# Patient Record
Sex: Female | Born: 1950
Health system: Southern US, Community
[De-identification: ages and names within clinical notes are randomized; demographics above are authoritative.]

## PROBLEM LIST (undated history)

## (undated) DIAGNOSIS — M8580 Other specified disorders of bone density and structure, unspecified site: Secondary | ICD-10-CM

## (undated) DIAGNOSIS — C801 Malignant (primary) neoplasm, unspecified: Secondary | ICD-10-CM

## (undated) DIAGNOSIS — K625 Hemorrhage of anus and rectum: Secondary | ICD-10-CM

## (undated) DIAGNOSIS — Z803 Family history of malignant neoplasm of breast: Secondary | ICD-10-CM

## (undated) DIAGNOSIS — I10 Essential (primary) hypertension: Secondary | ICD-10-CM

## (undated) DIAGNOSIS — M810 Age-related osteoporosis without current pathological fracture: Secondary | ICD-10-CM

## (undated) HISTORY — DX: Age-related osteoporosis without current pathological fracture: M81.0

## (undated) HISTORY — PX: KNEE ARTHROSCOPY: SHX127

## (undated) HISTORY — DX: Family history of malignant neoplasm of breast: Z80.3

## (undated) HISTORY — DX: Essential (primary) hypertension: I10

## (undated) HISTORY — DX: Other specified disorders of bone density and structure, unspecified site: M85.80

## (undated) HISTORY — DX: Malignant (primary) neoplasm, unspecified: C80.1

## (undated) HISTORY — DX: Hemorrhage of anus and rectum: K62.5

---

## 1992-05-16 HISTORY — PX: TUBAL LIGATION: SHX77

## 1996-05-16 HISTORY — PX: BREAST SURGERY: SHX581

## 1999-07-12 ENCOUNTER — Other Ambulatory Visit: Admission: RE | Admit: 1999-07-12 | Discharge: 1999-07-12 | Payer: Self-pay | Admitting: Obstetrics & Gynecology

## 2000-01-31 ENCOUNTER — Encounter (INDEPENDENT_AMBULATORY_CARE_PROVIDER_SITE_OTHER): Payer: Self-pay | Admitting: Specialist

## 2000-01-31 ENCOUNTER — Other Ambulatory Visit: Admission: RE | Admit: 2000-01-31 | Discharge: 2000-01-31 | Payer: Self-pay | Admitting: Obstetrics & Gynecology

## 2001-11-20 ENCOUNTER — Other Ambulatory Visit: Admission: RE | Admit: 2001-11-20 | Discharge: 2001-11-20 | Payer: Self-pay | Admitting: Obstetrics and Gynecology

## 2002-11-22 ENCOUNTER — Other Ambulatory Visit: Admission: RE | Admit: 2002-11-22 | Discharge: 2002-11-22 | Payer: Self-pay | Admitting: Obstetrics and Gynecology

## 2003-09-02 ENCOUNTER — Encounter: Payer: Self-pay | Admitting: Internal Medicine

## 2003-09-02 LAB — HM COLONOSCOPY

## 2005-04-22 ENCOUNTER — Other Ambulatory Visit: Admission: RE | Admit: 2005-04-22 | Discharge: 2005-04-22 | Payer: Self-pay | Admitting: Obstetrics and Gynecology

## 2006-05-16 HISTORY — PX: OTHER SURGICAL HISTORY: SHX169

## 2006-09-11 ENCOUNTER — Other Ambulatory Visit: Admission: RE | Admit: 2006-09-11 | Discharge: 2006-09-11 | Payer: Self-pay | Admitting: Obstetrics and Gynecology

## 2007-05-05 ENCOUNTER — Inpatient Hospital Stay (HOSPITAL_COMMUNITY): Admission: EM | Admit: 2007-05-05 | Discharge: 2007-05-07 | Payer: Self-pay | Admitting: Emergency Medicine

## 2007-09-14 ENCOUNTER — Other Ambulatory Visit: Admission: RE | Admit: 2007-09-14 | Discharge: 2007-09-14 | Payer: Self-pay | Admitting: Obstetrics and Gynecology

## 2008-09-19 ENCOUNTER — Ambulatory Visit: Payer: Self-pay | Admitting: Internal Medicine

## 2008-09-19 DIAGNOSIS — I10 Essential (primary) hypertension: Secondary | ICD-10-CM | POA: Insufficient documentation

## 2008-09-19 DIAGNOSIS — K625 Hemorrhage of anus and rectum: Secondary | ICD-10-CM | POA: Insufficient documentation

## 2009-08-18 ENCOUNTER — Ambulatory Visit: Payer: Self-pay

## 2009-09-02 ENCOUNTER — Ambulatory Visit: Payer: Self-pay

## 2009-12-01 LAB — HM PAP SMEAR: HM Pap smear: NORMAL

## 2010-09-28 NOTE — Op Note (Signed)
NAME:  Diane Day, Diane Day NO.:  [id number]   MEDICAL RECORD NO.:  [id number]          PATIENT TYPE:  INP   LOCATION:  1604                         FACILITY:  [facility]   PHYSICIAN:  Lenard Galloway. Mortenson, M.D.DATE OF BIRTH:  May 21, 1950   DATE OF PROCEDURE:  05/05/2007  DATE OF DISCHARGE:                               OPERATIVE REPORT   PREOPERATIVE DIAGNOSIS:  Very complex, displaced three parts epicondylar  fracture, right elbow, with fracture medial lateral condyles displaced  and midline transverse fracture trochlea displaced and rotated.   POSTOPERATIVE DIAGNOSIS:  Very complex, displaced three parts  epicondylar fracture, right elbow, with fracture medial lateral condyles  displaced and midline transverse fracture trochlea displaced and  rotated.   OPERATION:  Osteotomy olecranon using fixation olecranon plate;  transtrochlear  interference screws. right elbow; open reduction  internal fixation T epicondylar fractures using congruent medial and  lateral humeral plates; anterior transfer of ulnar nerve: the entire  operative was extremely complex and required an extended operative time.   ANESTHESIA:  General.   SURGEON:  Gaylyn Rong.   PROCEDURE:  The patient placed on the operating table in a supine  position.  The right extremity was prepped with DuraPrep and draped out  in the usual manner.  A tourniquet was placed on the upper arm.  The  patient was put in supine position.  Arm was then wrapped out with  Esmarch.  Tourniquet was elevated.  The arm was then put up on the  chest.  Incision was made starting distal to the olecranon about 4  inches and carried proximal to the olecranon and up the humeral shaft  about 6 inches.  Skin edges were retracted.  Fascia was bluntly  dissected away with the fingers.  The olecranon fixation plate was  placed over the tip of the olecranon, and drill hole was placed through  the hole for the fixation screw in the  tip the olecranon.  Osteotomy  site was done in a chevron manner between the fixation holes in the  olecranon plate.  The triceps was then swept proximally and soft tissues  bluntly dissected.  The ulnar nerve was identified and isolated.  Ulnar  nerve was totally released and throughout the entire operative procedure  kept in the visual field and had soft rubber drains around the nerve.  The triceps was then stripped off the posterior aspect of the humerus  using blunt dissection and finger dissection only.  Fascia was split as  noted above with the finger tips.  Nerve stimulator was used throughout.  Wound was then irrigated.  A number of free fragments of loose bone was  then removed.  Radial nerve could be seen and stimulated with a nerve  centimeter, and this functioned nicely.  Attention was first turned to  the epicondyles and the trochlea.  Trochlea was then reduced, and  anatomic position held in place with clamps.  Fixation pins were then  applied, and excellent fixation of the trochlea was achieved.  This was  accomplished to my satisfaction.  The medial and lateral T-split  was  reduced and held in  place with a large clamp.  A great deal of time was  spent getting this in anatomic position.  Once this was accomplished,  Kirchner wires were placed in crisscross pattern to stabilize the  fracture.  The trochlear and epicondyles were then placed over the  distal end of the T fracture of the humerus, and this held in anatomic  position and further K-wires were used. Essentially an anatomic  reconstruction was achieved after a great deal of reparative sites  maneuvering. I was  __________  very pleased with the construct once  this was accomplished.  Two guide wires were placed across the trochlea  from lateral to medial, and this was overdrilled with a drill.  Two  partially threaded 2.7 screws were then passed from lateral to medial  and this locked the trochlea in excellent  position.  It was very stable.  At this point, the long humeral plates were selected.  The lateral plate  was put in position first and the whole construct held in place with  lateral plate and clamps.  Anatomic position was achieved.  Drill holes  were made in the lateral plate, and both cortices were rasped and  drilled, measured an appropriate length, cortical screws were inserted.  This gave an extremely tight anatomic construct that was very stable.  Several screws were placed in the medial cortex distally through the  plate.  During this procedure, the radial nerve was isolated and  projected and visualized throughout the entire procedure and never at  risk.  It was frequently checked with a nerve stimulator, and this  functioned very nicely at the end of the case.  The long medial plate  over the medial condyle was then put in position and had to be contoured  with contour wrenches and finally fit very nicely.  This held in place  with clamps.  Drill holes were made at the appropriate level.  These  were measured, and appropriate length screws were then inserted.  Several screws were placed far above the fracture site, both in the  medial and lateral side.  The mrdial plate was then tightened down, and  an excellent construct was achieved.  The elbow could be put through an  excellent range of motion.  There was no instability with full range of  motion, smooth gliding of the joint and articular surfaces.  The  tourniquet was dropped after several hours, and bleeders were  coagulated.  Nice hemostasis was achieved.  At this point, it was felt  appropriate to reattach olecranon in a standard manner.  The olecranon  plate was put in place.  The screw hole which been placed previously had  a long screw placed through the olecranon plate and through the tip of  the olecranon into the proximal ulna.  The distal screw holes in the  plate were drilled, and the appropriate length screws  were inserted.  An  anatomic reconstruction of the olecranon osteotomy was achieved very  nicely.  At this point, the C-arm was brought in position, and multiple  radiographs were taken.  I was extremely pleased with the entire  construct.  Again, this took an extended period of time and was  extremely complicated and difficult reconstruction but ultimately was  extremely stable and essentially in anatomic position.  Again, the wound  was irrigated with copious amounts of saline solution.  Part of the  fascia was closed.  The ulnar nerve had been transferred anteriorly to  prevent  impingement on the medial plate.  Two-0 Vicryl was used to close  the subcutaneous tissue.  Stainless steel staples were used to close the  skin.  Sterile dressings were applied, and the patient returned to  recovery room in excellent condition.  Technically, this went extremely  well. The operative time was in excess of 4 hours.      Rodney A. Chaney Malling, M.D.  Electronically Signed     RAM/MEDQ  D:  05/06/2007  T:  05/07/2007  Job:  [id number]

## 2010-11-30 ENCOUNTER — Encounter: Payer: Self-pay | Admitting: Internal Medicine

## 2010-12-02 ENCOUNTER — Encounter: Payer: Self-pay | Admitting: Internal Medicine

## 2010-12-02 ENCOUNTER — Ambulatory Visit (INDEPENDENT_AMBULATORY_CARE_PROVIDER_SITE_OTHER): Payer: BC Managed Care – PPO | Admitting: Internal Medicine

## 2010-12-02 ENCOUNTER — Other Ambulatory Visit (INDEPENDENT_AMBULATORY_CARE_PROVIDER_SITE_OTHER): Payer: BC Managed Care – PPO

## 2010-12-02 VITALS — BP 118/78 | HR 79 | Temp 98.4°F | Resp 16 | Wt 141.0 lb

## 2010-12-02 DIAGNOSIS — I10 Essential (primary) hypertension: Secondary | ICD-10-CM

## 2010-12-02 LAB — BASIC METABOLIC PANEL
BUN: 16 mg/dL (ref 6–23)
Calcium: 9.3 mg/dL (ref 8.4–10.5)
Chloride: 106 mEq/L (ref 96–112)
GFR: 81.94 mL/min (ref >60.00)
Glucose, Bld: 105 mg/dL — ABNORMAL HIGH (ref 70–99)

## 2010-12-02 MED ORDER — BENAZEPRIL HCL 10 MG PO TABS: 10.0000 mg | ORAL_TABLET | Freq: Every day | ORAL | Status: DC

## 2010-12-02 NOTE — Progress Notes (Signed)
  Subjective:    Patient ID: Diane Day, female    DOB: 02/05/1951, 60 y.o.   MRN: [id number]  Hypertension This is a chronic problem. The current episode started more than 1 year ago. The problem has been gradually improving since onset. The problem is controlled. Pertinent negatives include no anxiety, blurred vision, chest pain, headaches, neck pain, orthopnea, palpitations, peripheral edema, PND, shortness of breath or sweats. There are no associated agents to hypertension. Past treatments include ACE inhibitors. The current treatment provides significant improvement. There are no compliance problems.       Review of Systems  Constitutional: Negative.   HENT: Negative.  Negative for neck pain.   Eyes: Negative.  Negative for blurred vision.  Respiratory: Negative.  Negative for shortness of breath.   Cardiovascular: Negative.  Negative for chest pain, palpitations, orthopnea and PND.  Gastrointestinal: Negative.   Genitourinary: Negative.   Musculoskeletal: Negative.   Skin: Negative.   Neurological: Negative.  Negative for headaches.  Hematological: Negative.   Psychiatric/Behavioral: Negative.        Objective:   Physical Exam  Vitals reviewed. Constitutional: She is oriented to person, place, and time. She appears well-developed and well-nourished. No distress.  HENT:  Head: Normocephalic and atraumatic.  Right Ear: External ear normal.  Left Ear: External ear normal.  Nose: Nose normal.  Mouth/Throat: Oropharynx is clear and moist. No oropharyngeal exudate.  Eyes: Conjunctivae and EOM are normal. Pupils are equal, round, and reactive to light. Right eye exhibits no discharge. Left eye exhibits no discharge. No scleral icterus.  Neck: Normal range of motion. Neck supple. No JVD present. No tracheal deviation present. No thyromegaly present.  Cardiovascular: Normal rate, regular rhythm, normal heart sounds and intact distal pulses.  Exam reveals no gallop and no  friction rub.   No murmur heard. Pulmonary/Chest: Effort normal and breath sounds normal. No stridor. No respiratory distress. She has no wheezes. She has no rales. She exhibits no tenderness.  Abdominal: Soft. Bowel sounds are normal. She exhibits no distension. There is no tenderness. There is no rebound and no guarding.  Musculoskeletal: Normal range of motion. She exhibits no edema.  Lymphadenopathy:    She has no cervical adenopathy.  Neurological: She is alert and oriented to person, place, and time. She has normal reflexes. She displays normal reflexes. No cranial nerve deficit. She exhibits normal muscle tone. Coordination normal.  Skin: Skin is warm and dry. No rash noted. She is not diaphoretic. No erythema. No pallor.  Psychiatric: She has a normal mood and affect. Her behavior is normal. Judgment and thought content normal.          Assessment & Plan:

## 2010-12-02 NOTE — Assessment & Plan Note (Signed)
She is doing well on the ACEI, I will check her lytes and renal function

## 2010-12-02 NOTE — Patient Instructions (Signed)

## 2011-02-18 LAB — CBC
HCT: 36.8
Hemoglobin: 12.7
Hemoglobin: 7.2 — CL
MCHC: 35.7
MCV: 88.7
Platelets: 64 — ABNORMAL LOW
RBC: 2.26 — ABNORMAL LOW
WBC: 6.6

## 2011-02-18 LAB — BASIC METABOLIC PANEL
BUN: 12
Chloride: 111
Creatinine, Ser: 0.76
GFR calc non Af Amer: >60
Potassium: 3.5
Sodium: 144

## 2011-02-18 LAB — DIFFERENTIAL
Basophils Relative: 0
Lymphs Abs: 0.7
Monocytes Absolute: 0.2
Monocytes Relative: 3
Neutrophils Relative %: 87 — ABNORMAL HIGH

## 2011-06-13 ENCOUNTER — Encounter (HOSPITAL_COMMUNITY): Payer: Self-pay | Admitting: Pharmacy Technician

## 2011-09-28 ENCOUNTER — Other Ambulatory Visit: Payer: Self-pay | Admitting: Obstetrics and Gynecology

## 2011-09-28 DIAGNOSIS — Z803 Family history of malignant neoplasm of breast: Secondary | ICD-10-CM

## 2011-10-13 ENCOUNTER — Ambulatory Visit
Admission: RE | Admit: 2011-10-13 | Discharge: 2011-10-13 | Disposition: A | Discharge status: Home / Self Care | Payer: BC Managed Care – PPO | Source: Ambulatory Visit | Attending: Obstetrics and Gynecology | Admitting: Obstetrics and Gynecology

## 2011-10-13 DIAGNOSIS — Z803 Family history of malignant neoplasm of breast: Secondary | ICD-10-CM

## 2011-10-13 MED ORDER — GADOBENATE DIMEGLUMINE 529 MG/ML IV SOLN: 12.0000 mL | Freq: Once | INTRAVENOUS | Status: AC | PRN

## 2011-11-02 ENCOUNTER — Telehealth: Payer: Self-pay | Admitting: Internal Medicine

## 2011-11-02 NOTE — Telephone Encounter (Signed)
Caller: Katyra/Patient; PCP: Sanda Linger; CB#: [phone]; Call regarding Back Pain; Mid back spasms  (under her shoulder blades) onset 1000 this AM. No known trauma. Has had episodes of similar back pain in the past. Ibuprofen 800mg  mid afternoon w/o relief. Appt sched for 11/03/11 @ 1015 with Dr. Yetta Barre per Back Sx Protocol.

## 2011-11-03 ENCOUNTER — Encounter: Payer: Self-pay | Admitting: Internal Medicine

## 2011-11-03 ENCOUNTER — Ambulatory Visit (INDEPENDENT_AMBULATORY_CARE_PROVIDER_SITE_OTHER): Payer: BC Managed Care – PPO | Admitting: Internal Medicine

## 2011-11-03 VITALS — BP 134/82 | HR 69 | Temp 97.8°F | Resp 16 | Wt 132.2 lb

## 2011-11-03 DIAGNOSIS — I10 Essential (primary) hypertension: Secondary | ICD-10-CM

## 2011-11-03 DIAGNOSIS — M546 Pain in thoracic spine: Secondary | ICD-10-CM | POA: Insufficient documentation

## 2011-11-03 MED ORDER — OXYCODONE-ACETAMINOPHEN 7.5-325 MG PO TABS: 1.0000 | ORAL_TABLET | ORAL | Status: AC | PRN

## 2011-11-03 NOTE — Assessment & Plan Note (Signed)
She has MS T-spine pain with no mitigating factors, she has tried nsaids but needs something stronger for pain, she will try percocet and she will continue with yoga, pt ed material was given to her as well

## 2011-11-03 NOTE — Progress Notes (Signed)
Subjective:    Patient ID: Diane Day, female    DOB: 29-Sep-1950, 61 y.o.   MRN: [id number]  Back Pain This is a recurrent problem. The current episode started more than 1 year ago (first episode was 2 years ago, recurred yesterday). The problem occurs intermittently. The problem is unchanged. The pain is present in the thoracic spine. The quality of the pain is described as aching. The pain does not radiate. The pain is at a severity of 4/10. The pain is moderate. The pain is worse during the day. The symptoms are aggravated by bending. Stiffness is present all day. Pertinent negatives include no abdominal pain, bladder incontinence, bowel incontinence, chest pain, dysuria, fever, headaches, leg pain, numbness, paresis, paresthesias, pelvic pain, perianal numbness, tingling, weakness or weight loss. She has tried NSAIDs for the symptoms. The treatment provided mild relief.      Review of Systems  Constitutional: Negative.  Negative for fever and weight loss.  HENT: Negative.   Eyes: Negative.   Respiratory: Negative.  Negative for cough, chest tightness, shortness of breath, wheezing and stridor.   Cardiovascular: Negative.  Negative for chest pain.  Gastrointestinal: Negative.  Negative for abdominal pain and bowel incontinence.  Genitourinary: Negative.  Negative for bladder incontinence, dysuria and pelvic pain.  Musculoskeletal: Positive for back pain. Negative for myalgias, joint swelling, arthralgias and gait problem.  Skin: Negative.   Neurological: Negative for dizziness, tingling, tremors, seizures, facial asymmetry, speech difficulty, weakness, light-headedness, numbness, headaches and paresthesias.  Hematological: Negative for adenopathy. Does not bruise/bleed easily.  Psychiatric/Behavioral: Negative.        Objective:   Physical Exam  Vitals reviewed. Constitutional: She is oriented to person, place, and time. She appears well-developed and well-nourished. No  distress.  HENT:  Head: Normocephalic and atraumatic.  Mouth/Throat: Oropharynx is clear and moist. No oropharyngeal exudate.  Eyes: Conjunctivae are normal. Right eye exhibits no discharge. Left eye exhibits no discharge. No scleral icterus.  Neck: Normal range of motion. Neck supple. No JVD present. No tracheal deviation present. No thyromegaly present.  Cardiovascular: Normal rate, regular rhythm, normal heart sounds and intact distal pulses.  Exam reveals no gallop and no friction rub.   No murmur heard. Pulmonary/Chest: Effort normal and breath sounds normal. No stridor. No respiratory distress. She has no wheezes. She has no rales. She exhibits no tenderness.  Abdominal: Soft. Bowel sounds are normal. She exhibits no distension and no mass. There is no tenderness. There is no rebound and no guarding.  Musculoskeletal: Normal range of motion. She exhibits no edema and no tenderness.       Thoracic back: Normal. She exhibits normal range of motion, no tenderness, no bony tenderness, no swelling, no edema, no deformity, no laceration, no pain, no spasm and normal pulse.  Lymphadenopathy:    She has no cervical adenopathy.  Neurological: She is alert and oriented to person, place, and time. She has normal strength. She displays no atrophy, no tremor and normal reflexes. No cranial nerve deficit or sensory deficit. She exhibits normal muscle tone. She displays a negative Romberg sign. She displays no seizure activity. Coordination and gait normal.  Reflex Scores:      Tricep reflexes are 1+ on the right side and 1+ on the left side.      Bicep reflexes are 1+ on the right side.      Brachioradialis reflexes are 1+ on the right side.      Patellar reflexes are 1+ on the right  side and 1+ on the left side.      Achilles reflexes are 1+ on the right side and 1+ on the left side. Skin: Skin is warm and dry. No rash noted. She is not diaphoretic. No erythema. No pallor.  Psychiatric: She has a  normal mood and affect. Her behavior is normal. Judgment and thought content normal.     Lab Results  Component Value Date   WBC 14.3* 05/05/2007   HGB 12.7 RESULT REPEATED AND VERIFIED DELTA CHECK NOTED 05/05/2007   HCT 36.8 05/05/2007   PLT 231 RESULT REPEATED AND VERIFIED DELTA CHECK NOTED 05/05/2007   GLUCOSE 105* 12/02/2010   NA 142 12/02/2010   K 5.1 12/02/2010   CL 106 12/02/2010   CREATININE 0.8 12/02/2010   BUN 16 12/02/2010   CO2 29 12/02/2010      Assessment & Plan:

## 2011-11-03 NOTE — Patient Instructions (Signed)
Back Pain, Adult Low back pain is very common. About 1 in 5 people have back pain.The cause of low back pain is rarely dangerous. The pain often gets better over time.About half of people with a sudden onset of back pain feel better in just 2 weeks. About 8 in 10 people feel better by 6 weeks.  CAUSES Some common causes of back pain include:  Strain of the muscles or ligaments supporting the spine.   Wear and tear (degeneration) of the spinal discs.   Arthritis.   Direct injury to the back.  DIAGNOSIS Most of the time, the direct cause of low back pain is not known.However, back pain can be treated effectively even when the exact cause of the pain is unknown.Answering your caregiver's questions about your overall health and symptoms is one of the most accurate ways to make sure the cause of your pain is not dangerous. If your caregiver needs more information, he or she may order lab work or imaging tests (X-rays or MRIs).However, even if imaging tests show changes in your back, this usually does not require surgery. HOME CARE INSTRUCTIONS For many people, back pain returns.Since low back pain is rarely dangerous, it is often a condition that people can learn to manageon their own.   Remain active. It is stressful on the back to sit or stand in one place. Do not sit, drive, or stand in one place for more than 30 minutes at a time. Take short walks on level surfaces as soon as pain allows.Try to increase the length of time you walk each day.   Do not stay in bed.Resting more than 1 or 2 days can delay your recovery.   Do not avoid exercise or work.Your body is made to move.It is not dangerous to be active, even though your back may hurt.Your back will likely heal faster if you return to being active before your pain is gone.   Pay attention to your body when you bend and lift. Many people have less discomfortwhen lifting if they bend their knees, keep the load close to their  bodies,and avoid twisting. Often, the most comfortable positions are those that put less stress on your recovering back.   Find a comfortable position to sleep. Use a firm mattress and lie on your side with your knees slightly bent. If you lie on your back, put a pillow under your knees.   Only take over-the-counter or prescription medicines as directed by your caregiver. Over-the-counter medicines to reduce pain and inflammation are often the most helpful.Your caregiver may prescribe muscle relaxant drugs.These medicines help dull your pain so you can more quickly return to your normal activities and healthy exercise.   Put ice on the injured area.   Put ice in a plastic bag.   Place a towel between your skin and the bag.   Leave the ice on for 15 to 20 minutes, 3 to 4 times a day for the first 2 to 3 days. After that, ice and heat may be alternated to reduce pain and spasms.   Ask your caregiver about trying back exercises and gentle massage. This may be of some benefit.   Avoid feeling anxious or stressed.Stress increases muscle tension and can worsen back pain.It is important to recognize when you are anxious or stressed and learn ways to manage it.Exercise is a great option.  SEEK MEDICAL CARE IF:  You have pain that is not relieved with rest or medicine.   You have   pain that does not improve in 1 week.   You have new symptoms.   You are generally not feeling well.  SEEK IMMEDIATE MEDICAL CARE IF:   You have pain that radiates from your back into your legs.   You develop new bowel or bladder control problems.   You have unusual weakness or numbness in your arms or legs.   You develop nausea or vomiting.   You develop abdominal pain.   You feel faint.  Document Released: 05/02/2005 Document Revised: 04/21/2011 Document Reviewed: 09/20/2010 [employer] Patient Information 2012 [employer]. 

## 2011-11-03 NOTE — Assessment & Plan Note (Signed)
Her BP is well controlled 

## 2011-12-02 ENCOUNTER — Ambulatory Visit (INDEPENDENT_AMBULATORY_CARE_PROVIDER_SITE_OTHER): Payer: BC Managed Care – PPO | Admitting: Internal Medicine

## 2011-12-02 ENCOUNTER — Telehealth: Payer: Self-pay

## 2011-12-02 ENCOUNTER — Other Ambulatory Visit (INDEPENDENT_AMBULATORY_CARE_PROVIDER_SITE_OTHER): Payer: BC Managed Care – PPO

## 2011-12-02 ENCOUNTER — Encounter: Payer: Self-pay | Admitting: Internal Medicine

## 2011-12-02 VITALS — BP 156/96 | HR 69 | Temp 98.4°F | Resp 16 | Wt 133.0 lb

## 2011-12-02 DIAGNOSIS — E7800 Pure hypercholesterolemia, unspecified: Secondary | ICD-10-CM

## 2011-12-02 DIAGNOSIS — E875 Hyperkalemia: Secondary | ICD-10-CM

## 2011-12-02 DIAGNOSIS — M546 Pain in thoracic spine: Secondary | ICD-10-CM

## 2011-12-02 DIAGNOSIS — I10 Essential (primary) hypertension: Secondary | ICD-10-CM

## 2011-12-02 DIAGNOSIS — R7309 Other abnormal glucose: Secondary | ICD-10-CM

## 2011-12-02 DIAGNOSIS — R739 Hyperglycemia, unspecified: Secondary | ICD-10-CM | POA: Insufficient documentation

## 2011-12-02 LAB — COMPREHENSIVE METABOLIC PANEL
ALT: 17 U/L (ref 0–35)
AST: 19 U/L (ref 0–37)
Albumin: 4.5 g/dL (ref 3.5–5.2)
BUN: 15 mg/dL (ref 6–23)
Calcium: 10 mg/dL (ref 8.4–10.5)
Chloride: 109 mEq/L (ref 96–112)
Potassium: 6.7 mEq/L (ref 3.5–5.1)
Sodium: 144 mEq/L (ref 135–145)
Total Protein: 6.9 g/dL (ref 6.0–8.3)

## 2011-12-02 LAB — CBC WITH DIFFERENTIAL/PLATELET
Basophils Absolute: 0.1 10*3/uL (ref 0.0–0.1)
Eosinophils Absolute: 0 10*3/uL (ref 0.0–0.7)
Lymphocytes Relative: 34.1 % (ref 12.0–46.0)
MCHC: 33.2 g/dL (ref 30.0–36.0)
Monocytes Absolute: 0.4 10*3/uL (ref 0.1–1.0)
Neutrophils Relative %: 56.7 % (ref 43.0–77.0)
Platelets: 211 10*3/uL (ref 150.0–400.0)
RBC: 4.7 Mil/uL (ref 3.87–5.11)
RDW: 13.5 % (ref 11.5–14.6)

## 2011-12-02 LAB — URINALYSIS, ROUTINE W REFLEX MICROSCOPIC
Bilirubin Urine: NEGATIVE
Leukocytes, UA: NEGATIVE
Nitrite: NEGATIVE
Specific Gravity, Urine: <=1.005 (ref 1.000–1.030)
pH: 6.5 (ref 5.0–8.0)

## 2011-12-02 LAB — TSH: TSH: 0.56 u[IU]/mL (ref 0.35–5.50)

## 2011-12-02 LAB — HEMOGLOBIN A1C: Hgb A1c MFr Bld: 5.7 % (ref 4.6–6.5)

## 2011-12-02 LAB — LIPID PANEL
Cholesterol: 200 mg/dL (ref 0–200)
LDL Cholesterol: 101 mg/dL — ABNORMAL HIGH (ref 0–99)

## 2011-12-02 LAB — POTASSIUM: Potassium: 3.9 mEq/L (ref 3.5–5.1)

## 2011-12-02 MED ORDER — OLMESARTAN MEDOXOMIL-HCTZ 40-12.5 MG PO TABS: 1.0000 | ORAL_TABLET | Freq: Every day | ORAL | Status: DC

## 2011-12-02 NOTE — Assessment & Plan Note (Signed)
Labs today showed a high K+, she will return for recheck and is it is elevated on repeat test then I believe this is due to the ACEI so the change to Benicar should help this

## 2011-12-02 NOTE — Telephone Encounter (Signed)
Hope from [place] lab called to report a critical potassium of 6.7. MD notified and advised for pt to come back and have rechecked. Patient notified and will stop back by for recheck

## 2011-12-02 NOTE — Assessment & Plan Note (Signed)
Her BP is not well controlled so I have asked her to stop the ACEI and to start Benicar-HCT, I will check her labs today to look for secondary causes and end organ damage.

## 2011-12-02 NOTE — Patient Instructions (Signed)

## 2011-12-02 NOTE — Progress Notes (Signed)
  Subjective:    Patient ID: Diane Day, female    DOB: 1950/12/22, 61 y.o.   MRN: [id number]  Hypertension This is a chronic problem. The current episode started more than 1 year ago. The problem has been gradually worsening since onset. The problem is uncontrolled. Pertinent negatives include no anxiety, blurred vision, chest pain, headaches, malaise/fatigue, neck pain, orthopnea, palpitations, peripheral edema, PND, shortness of breath or sweats. Past treatments include ACE inhibitors. The current treatment provides mild improvement. There are no compliance problems.       Review of Systems  Constitutional: Negative for fever, chills, malaise/fatigue, diaphoresis, activity change, appetite change, fatigue and unexpected weight change.  HENT: Negative for neck pain.   Eyes: Negative.  Negative for blurred vision.  Respiratory: Negative for cough, chest tightness, shortness of breath, wheezing and stridor.   Cardiovascular: Negative for chest pain, palpitations, orthopnea, leg swelling and PND.  Gastrointestinal: Negative for vomiting, abdominal pain, diarrhea, constipation, blood in stool and anal bleeding.  Genitourinary: Negative.   Musculoskeletal: Negative.   Skin: Negative for color change, pallor, rash and wound.  Neurological: Negative.  Negative for headaches.  Hematological: Negative for adenopathy. Does not bruise/bleed easily.  Psychiatric/Behavioral: Negative.        Objective:   Physical Exam  Vitals reviewed. Constitutional: She is oriented to person, place, and time. She appears well-developed and well-nourished. No distress.  HENT:  Head: Normocephalic and atraumatic.  Mouth/Throat: Oropharynx is clear and moist. No oropharyngeal exudate.  Eyes: Conjunctivae are normal. Right eye exhibits no discharge. Left eye exhibits no discharge. No scleral icterus.  Neck: Normal range of motion. Neck supple. No JVD present. No tracheal deviation present. No thyromegaly  present.  Cardiovascular: Normal rate, regular rhythm, normal heart sounds and intact distal pulses.  Exam reveals no gallop and no friction rub.   No murmur heard. Pulmonary/Chest: Effort normal and breath sounds normal. No stridor. No respiratory distress. She has no wheezes. She has no rales. She exhibits no tenderness.  Abdominal: Soft. Bowel sounds are normal. She exhibits no distension and no mass. There is no tenderness. There is no rebound and no guarding.  Musculoskeletal: Normal range of motion. She exhibits no edema and no tenderness.  Lymphadenopathy:    She has no cervical adenopathy.  Neurological: She is oriented to person, place, and time.  Skin: Skin is warm and dry. No rash noted. She is not diaphoretic. No erythema. No pallor.  Psychiatric: She has a normal mood and affect. Her behavior is normal. Judgment and thought content normal.     Lab Results  Component Value Date   WBC 14.3* 05/05/2007   HGB 12.7 RESULT REPEATED AND VERIFIED DELTA CHECK NOTED 05/05/2007   HCT 36.8 05/05/2007   PLT 231 RESULT REPEATED AND VERIFIED DELTA CHECK NOTED 05/05/2007   GLUCOSE 105* 12/02/2010   NA 142 12/02/2010   K 5.1 12/02/2010   CL 106 12/02/2010   CREATININE 0.8 12/02/2010   BUN 16 12/02/2010   CO2 29 12/02/2010       Assessment & Plan:

## 2011-12-02 NOTE — Assessment & Plan Note (Signed)
This has resolved.

## 2011-12-02 NOTE — Assessment & Plan Note (Signed)
I will check her a1c today to see if she has developed DM II 

## 2011-12-02 NOTE — Assessment & Plan Note (Signed)
FLP TSH and CMP today 

## 2012-01-13 ENCOUNTER — Ambulatory Visit (INDEPENDENT_AMBULATORY_CARE_PROVIDER_SITE_OTHER): Payer: BC Managed Care – PPO

## 2012-01-13 ENCOUNTER — Other Ambulatory Visit: Payer: Self-pay

## 2012-01-13 VITALS — BP 118/76

## 2012-01-13 DIAGNOSIS — I10 Essential (primary) hypertension: Secondary | ICD-10-CM

## 2012-01-13 MED ORDER — OLMESARTAN MEDOXOMIL-HCTZ 40-12.5 MG PO TABS: 1.0000 | ORAL_TABLET | Freq: Every day | ORAL | Status: DC

## 2012-07-23 ENCOUNTER — Telehealth: Payer: Self-pay | Admitting: Internal Medicine

## 2012-07-23 NOTE — Telephone Encounter (Signed)
Spoke with patient and she states she is having intermittent rectal bleeding. She is using Preparation H without relief. Scheduled with Doug Sou, PA on 07/25/12 at 8:30 AM.

## 2012-07-25 ENCOUNTER — Ambulatory Visit (INDEPENDENT_AMBULATORY_CARE_PROVIDER_SITE_OTHER): Payer: BC Managed Care – PPO | Admitting: Gastroenterology

## 2012-07-25 ENCOUNTER — Encounter: Payer: Self-pay | Admitting: Gastroenterology

## 2012-07-25 ENCOUNTER — Encounter: Payer: Self-pay | Admitting: Internal Medicine

## 2012-07-25 ENCOUNTER — Other Ambulatory Visit: Payer: Self-pay | Admitting: *Deleted

## 2012-07-25 VITALS — BP 118/68 | HR 66 | Ht 67.0 in | Wt 131.0 lb

## 2012-07-25 DIAGNOSIS — K922 Gastrointestinal hemorrhage, unspecified: Secondary | ICD-10-CM | POA: Insufficient documentation

## 2012-07-25 MED ORDER — HYDROCORTISONE ACE-PRAMOXINE 2.5-1 % RE CREA: TOPICAL_CREAM | Freq: Two times a day (BID) | RECTAL | Status: DC

## 2012-07-25 MED ORDER — MOVIPREP 100 G PO SOLR: 1.0000 | Freq: Once | ORAL | Status: AC

## 2012-07-25 NOTE — Progress Notes (Signed)
07/25/2012 Diane Day [id number] 08-07-1950   HISTORY OF PRESENT ILLNESS:  Patient is a pleasant 62 year old female who is a patient of Dr. Regino Schultze.  She was seen in 2005 for complaints of rectal bleeding and underwent colonoscopy, which showed only diverticulosis.  She returned in 2010 with complaints of the same type of rectal bleeding and was treated with analpram cream for outlet bleeding.  Says that the cream helps temporarily while she was using it, however, the bleeding eventually returns.  She returns once again today with the same complaint.  Has intermittent small volume rectal bleeding on the TP.  Worsens with walking.  BM's regular just a little softer more recently and with some urgency at times.  No abdominal pain.  Appetite is good.  Denies weight loss, but I do notice that her weight is down 13 pounds since 5 years ago.     Past Medical History  Diagnosis Date  . Rectal bleeding   . Hypertension    Past Surgical History  Procedure Laterality Date  . Repair broken arm  2008  . Tubal ligation  1994    reports that she quit smoking about 37 years ago. She does not have any smokeless tobacco history on file. She reports that she drinks about 4.2 ounces of alcohol per week. She reports that she does not use illicit drugs. family history includes Cancer in her father, mother, and sister; Diabetes in her father; Heart disease in her father; and Hypertension in her father. No Known Allergies    Outpatient Encounter Prescriptions as of 07/25/2012  Medication Sig Dispense Refill  . calcium carbonate (TUMS - DOSED IN MG ELEMENTAL CALCIUM) 500 MG chewable tablet Chew 1 tablet by mouth as needed.       . Cholecalciferol (VITAMIN D3) 2000 UNITS TABS Take by mouth every morning.        . Multiple Vitamin (MULTIVITAMIN) tablet Take 1 tablet by mouth daily.        Marland Kitchen olmesartan-hydrochlorothiazide (BENICAR HCT) 40-12.5 MG per tablet Take 1 tablet by mouth daily.  90 tablet  1   . Probiotic Product (PROBIOTIC PO) Take by mouth.         No facility-administered encounter medications on file as of 07/25/2012.     REVIEW OF SYSTEMS  : All other systems reviewed and negative except where noted in the History of Present Illness.   PHYSICAL EXAM: BP 118/68  Pulse 66  Ht 5\' 7"  (1.702 m)  Wt 131 lb (59.421 kg)  BMI 20.51 kg/m2  LMP 05/16/2006 General: Well developed white female in no acute distress Head: Normocephalic and atraumatic Eyes:  sclerae anicteric,conjunctive pink. Ears: Normal auditory acuity Lungs: Clear throughout to auscultation Heart: Regular rate and rhythm Abdomen: Soft, nontender, non distended. No masses or hepatomegaly noted. Normal bowel sounds. Rectal: Deferred.  Will be performed at the time of colonoscopy. Musculoskeletal: Symmetrical with no gross deformities  Skin: No lesions on visible extremities Extremities: No edema  Neurological: Alert oriented x 4, grossly nonfocal Psychological:  Alert and cooperative. Normal mood and affect  ASSESSMENT AND PLAN: -LGIB:  Intermittent small volume rectal bleeding C/W outlet bleeding.  Her last colonoscopy was 9 years ago, which was normal at that time; was having the same issue then as well.  Will schedule for repeat colonoscopy due to the ongoing issue of rectal bleeding to re-evaluate for source of bleeding.  Bleeding was temporarily relieved by analpram cream in the past, therefore, we will prescribe  that again at this time as well; to apply BID.

## 2012-07-25 NOTE — Progress Notes (Signed)
Reviewed, will assess for possible proctitis

## 2012-07-25 NOTE — Patient Instructions (Addendum)
We sent prescriptions for the Analpram cream and the colonoscopy prep to [facility]. You have been scheduled for a colonoscopy with propofol. Please follow written instructions given to you at your visit today.  Please pick up your prep kit at the pharmacy within the next 1-3 days. If you use inhalers (even only as needed), please bring them with you on the day of your procedure.

## 2012-08-01 ENCOUNTER — Ambulatory Visit (INDEPENDENT_AMBULATORY_CARE_PROVIDER_SITE_OTHER): Payer: Self-pay

## 2012-08-01 ENCOUNTER — Telehealth: Payer: Self-pay | Admitting: Obstetrics and Gynecology

## 2012-08-01 DIAGNOSIS — F4320 Adjustment disorder, unspecified: Secondary | ICD-10-CM

## 2012-08-01 NOTE — Telephone Encounter (Signed)
Pt. Due for screening MMG April 2014, but with her fam hx she is wondering of a dx MMG would be a better choice. OK to send order for dx for April? Paper chart in cabinet for review if needed. AA

## 2012-08-01 NOTE — Telephone Encounter (Signed)
FAX MMG ORDER TO [facility]

## 2012-08-01 NOTE — Telephone Encounter (Signed)
Left msg for pt to call back

## 2012-08-01 NOTE — Telephone Encounter (Signed)
PATIENT STATES LAST YEAR SHE HAD A DIAGNOSTIC MAMMOGRAM DUE TO FAMILY HISTORY. HER SISTERS ONCOLOGIST TOLD THEM BASED ON HISTORY TO ALWAYS HAVE DIAGNOSTIC MAMMOGRAMS ANNUALLY.  PLEASE ADVSIE SUE 4:54PM

## 2012-08-01 NOTE — Telephone Encounter (Signed)
Tell patient her family history does not mean she needs a diag mammo.  That is determined by the appearance of her own particular breasts on her mammmo.

## 2012-08-01 NOTE — Telephone Encounter (Signed)
Ok, then that will be fine.  Go ahead with bilat diag MM.

## 2012-08-02 ENCOUNTER — Other Ambulatory Visit: Payer: Self-pay | Admitting: *Deleted

## 2012-08-02 ENCOUNTER — Telehealth: Payer: Self-pay | Admitting: *Deleted

## 2012-08-02 DIAGNOSIS — Z803 Family history of malignant neoplasm of breast: Secondary | ICD-10-CM

## 2012-08-02 NOTE — Telephone Encounter (Signed)
LEFT MESSAGE ON PATIENT CELL # OF DATE AND TIME OF DIAGNOSTIC BILATERAL MAMMOGRAM TO BE DONE ON 09/10/2012 @ 10:15am. PATIENT TO CALL OUR OFFICE IF ANY QUESTIONS. SUE

## 2012-08-02 NOTE — Telephone Encounter (Signed)
PATIENT NOTIFIED OF BILATERAL DIAG. MM TO BE ORDERED WITH [facility]. WILL CALL WITH DATE AND TIME. Diane Day

## 2012-08-29 ENCOUNTER — Ambulatory Visit (AMBULATORY_SURGERY_CENTER): Payer: BC Managed Care – PPO | Admitting: Internal Medicine

## 2012-08-29 ENCOUNTER — Encounter: Payer: Self-pay | Admitting: Internal Medicine

## 2012-08-29 VITALS — BP 127/81 | HR 77 | Temp 97.7°F | Resp 18 | Ht 67.0 in | Wt 131.0 lb

## 2012-08-29 DIAGNOSIS — Z1211 Encounter for screening for malignant neoplasm of colon: Secondary | ICD-10-CM

## 2012-08-29 DIAGNOSIS — K922 Gastrointestinal hemorrhage, unspecified: Secondary | ICD-10-CM

## 2012-08-29 DIAGNOSIS — K5730 Diverticulosis of large intestine without perforation or abscess without bleeding: Secondary | ICD-10-CM

## 2012-08-29 MED ORDER — SODIUM CHLORIDE 0.9 % IV SOLN: 500.0000 mL | INTRAVENOUS | Status: DC

## 2012-08-29 MED ORDER — HYDROCORTISONE ACETATE 25 MG RE SUPP: 25.0000 mg | Freq: Every evening | RECTAL | Status: DC | PRN

## 2012-08-29 NOTE — Progress Notes (Signed)
Propofol given over incremental dosages 

## 2012-08-29 NOTE — Patient Instructions (Addendum)
Handouts were given to your care partner on diverticulosis, high fiber diet and hemorrhoids.  Rx for anusol [facility] for hemorrhoids given.  You may resume your current medications today.  Please call if any questions or concerns.    YOU HAD AN ENDOSCOPIC PROCEDURE TODAY AT THE  ENDOSCOPY CENTER: Refer to the procedure report that was given to you for any specific questions about what was found during the examination.  If the procedure report does not answer your questions, please call your gastroenterologist to clarify.  If you requested that your care partner not be given the details of your procedure findings, then the procedure report has been included in a sealed envelope for you to review at your convenience later.  YOU SHOULD EXPECT: Some feelings of bloating in the abdomen. Passage of more gas than usual.  Walking can help get rid of the air that was put into your GI tract during the procedure and reduce the bloating. If you had a lower endoscopy (such as a colonoscopy or flexible sigmoidoscopy) you may notice spotting of blood in your stool or on the toilet paper. If you underwent a bowel prep for your procedure, then you may not have a normal bowel movement for a few days.  DIET: Your first meal following the procedure should be a light meal and then it is ok to progress to your normal diet.  A half-sandwich or bowl of soup is an example of a good first meal.  Heavy or fried foods are harder to digest and may make you feel nauseous or bloated.  Likewise meals heavy in dairy and vegetables can cause extra gas to form and this can also increase the bloating.  Drink plenty of fluids but you should avoid alcoholic beverages for 24 hours.  ACTIVITY: Your care partner should take you home directly after the procedure.  You should plan to take it easy, moving slowly for the rest of the day.  You can resume normal activity the day after the procedure however you should NOT DRIVE or use heavy machinery  for 24 hours (because of the sedation medicines used during the test).    SYMPTOMS TO REPORT IMMEDIATELY: A gastroenterologist can be reached at any hour.  During normal business hours, 8:30 AM to 5:00 PM Monday through Friday, call [phone].  After hours and on weekends, please call the GI answering service at [phone] who will take a message and have the physician on call contact you.   Following lower endoscopy (colonoscopy or flexible sigmoidoscopy):  Excessive amounts of blood in the stool  Significant tenderness or worsening of abdominal pains  Swelling of the abdomen that is new, acute  Fever of 100F or higher    FOLLOW UP: If any biopsies were taken you will be contacted by phone or by letter within the next 1-3 weeks.  Call your gastroenterologist if you have not heard about the biopsies in 3 weeks.  Our staff will call the home number listed on your records the next business day following your procedure to check on you and address any questions or concerns that you may have at that time regarding the information given to you following your procedure. This is a courtesy call and so if there is no answer at the home number and we have not heard from you through the emergency physician on call, we will assume that you have returned to your regular daily activities without incident.  SIGNATURES/CONFIDENTIALITY: You and/or your care partner  have signed paperwork which will be entered into your electronic medical record.  These signatures attest to the fact that that the information above on your After Visit Summary has been reviewed and is understood.  Full responsibility of the confidentiality of this discharge information lies with you and/or your care-partner.

## 2012-08-29 NOTE — Op Note (Signed)
[facility] [address] [place], [address]   COLONOSCOPY PROCEDURE REPORT  PATIENT: Diane Day, Diane Day  MR#: [id number] BIRTHDATE: 1951-04-24 , 61  yrs. old GENDER: Female ENDOSCOPIST: Hart Carwin, MD REFERRED BY:  Etta Grandchild, M.D. PROCEDURE DATE:  08/29/2012 PROCEDURE:   Colonoscopy, screening and Colonoscopy, diagnostic ASA CLASS:   Class I INDICATIONS:hematochezia and last colon 2005 - mild diverticulosis.  MEDICATIONS: MAC sedation, administered by CRNA and propofol (Diprivan) 150mg  IV  DESCRIPTION OF PROCEDURE:   After the risks and benefits and of the procedure were explained, informed consent was obtained.  A digital rectal exam revealed no abnormalities of the rectum.    The endoscope was introduced through the anus and advanced to the cecum, which was identified by both the appendix and ileocecal valve .  The quality of the prep was excellent, using MoviPrep . The instrument was then slowly withdrawn as the colon was fully examined.     COLON FINDINGS: Mild diverticulosis was noted in the sigmoid colon. Small internal hemorrhoids were found.     Retroflexed views revealed no abnormalities.     The scope was then withdrawn from the patient and the procedure completed.  COMPLICATIONS: There were no complications. ENDOSCOPIC IMPRESSION: 1.   Mild diverticulosis was noted in the sigmoid colon 2.   Small internal hemorrhoids  RECOMMENDATIONS: High fiber diet Anusol HC supp 1 hs, #12   REPEAT EXAM: In 10 year(s)  for Colonoscopy.  cc:  _______________________________ eSignedHart Carwin, MD 08/29/2012 11:27 AM     PATIENT NAME:  Diane Day, Diane Day MR#: [id number]

## 2012-08-30 ENCOUNTER — Telehealth: Payer: Self-pay

## 2012-08-30 LAB — HM COLONOSCOPY

## 2012-08-30 NOTE — Telephone Encounter (Signed)
Line is busy.

## 2012-09-05 ENCOUNTER — Telehealth: Payer: Self-pay

## 2012-09-05 ENCOUNTER — Telehealth: Payer: Self-pay | Admitting: *Deleted

## 2012-09-05 DIAGNOSIS — I10 Essential (primary) hypertension: Secondary | ICD-10-CM

## 2012-09-05 MED ORDER — LOSARTAN POTASSIUM-HCTZ 100-12.5 MG PO TABS: 1.0000 | ORAL_TABLET | Freq: Every day | ORAL | Status: DC

## 2012-09-05 NOTE — Telephone Encounter (Signed)
Phone call back from pt stating she has an appt with you on 09/17/12 at 8:30 am. She is out of her BP medication. She asks what should she be doing in the meantime until her appt with you? Please advise.

## 2012-09-05 NOTE — Telephone Encounter (Signed)
Per pt okay to leave a detailed message at [phone]. I let her know that she will need an appt before an alternative medication is given to her. Left call back # for her to do so.

## 2012-09-05 NOTE — Telephone Encounter (Signed)
Pt called about refill of BP medication. Pt states that she needs refill but that she also may want alternative medication since it is expensive. Left message for pt to callback office for clarification.

## 2012-09-05 NOTE — Telephone Encounter (Signed)
See Rx 

## 2012-09-05 NOTE — Telephone Encounter (Signed)
Called pt back to clarify previous message. She states she is currently on Benicar HCT 40 - 12.5 mg and it is expensive for her. She would like an alternative medication prescribed for her. She uses [facility]. Please advise.

## 2012-09-05 NOTE — Telephone Encounter (Signed)
She is due for an appt

## 2012-09-05 NOTE — Telephone Encounter (Signed)
Pt.notified

## 2012-09-10 LAB — HM MAMMOGRAPHY: HM Mammogram: NORMAL

## 2012-09-17 ENCOUNTER — Encounter: Payer: Self-pay | Admitting: Internal Medicine

## 2012-09-17 ENCOUNTER — Ambulatory Visit (INDEPENDENT_AMBULATORY_CARE_PROVIDER_SITE_OTHER): Payer: BC Managed Care – PPO | Admitting: Internal Medicine

## 2012-09-17 ENCOUNTER — Other Ambulatory Visit (INDEPENDENT_AMBULATORY_CARE_PROVIDER_SITE_OTHER): Payer: BC Managed Care – PPO

## 2012-09-17 VITALS — BP 124/78 | HR 72 | Temp 98.2°F | Resp 16 | Wt 135.0 lb

## 2012-09-17 DIAGNOSIS — I10 Essential (primary) hypertension: Secondary | ICD-10-CM

## 2012-09-17 DIAGNOSIS — M8580 Other specified disorders of bone density and structure, unspecified site: Secondary | ICD-10-CM

## 2012-09-17 DIAGNOSIS — Z0000 Encounter for general adult medical examination without abnormal findings: Secondary | ICD-10-CM

## 2012-09-17 LAB — CBC WITH DIFFERENTIAL/PLATELET
Basophils Absolute: 0.1 10*3/uL (ref 0.0–0.1)
Eosinophils Relative: 1 % (ref 0.0–5.0)
HCT: 39.7 % (ref 36.0–46.0)
Lymphocytes Relative: 39.4 % (ref 12.0–46.0)
Monocytes Relative: 10.4 % (ref 3.0–12.0)
Neutrophils Relative %: 47.9 % (ref 43.0–77.0)
Platelets: 224 10*3/uL (ref 150.0–400.0)
WBC: 4.3 10*3/uL — ABNORMAL LOW (ref 4.5–10.5)

## 2012-09-17 LAB — LIPID PANEL
Cholesterol: 189 mg/dL (ref 0–200)
VLDL: 4.8 mg/dL (ref 0.0–40.0)

## 2012-09-17 LAB — COMPREHENSIVE METABOLIC PANEL
Albumin: 4.2 g/dL (ref 3.5–5.2)
CO2: 27 mEq/L (ref 19–32)
Calcium: 9.1 mg/dL (ref 8.4–10.5)
Chloride: 106 mEq/L (ref 96–112)
GFR: 83.94 mL/min (ref >60.00)
Glucose, Bld: 100 mg/dL — ABNORMAL HIGH (ref 70–99)
Sodium: 142 mEq/L (ref 135–145)
Total Bilirubin: 0.9 mg/dL (ref 0.3–1.2)
Total Protein: 6.4 g/dL (ref 6.0–8.3)

## 2012-09-17 NOTE — Assessment & Plan Note (Signed)
Her BP is well controlled I will check her lytes and renal function today 

## 2012-09-17 NOTE — Patient Instructions (Signed)
Preventive Care for Adults, Female A healthy lifestyle and preventive care can promote health and wellness. Preventive health guidelines for women include the following key practices.  A routine yearly physical is a good way to check with your caregiver about your health and preventive screening. It is a chance to share any concerns and updates on your health, and to receive a thorough exam.  Visit your dentist for a routine exam and preventive care every 6 months. Brush your teeth twice a day and floss once a day. Good oral hygiene prevents tooth decay and gum disease.  The frequency of eye exams is based on your age, health, family medical history, use of contact lenses, and other factors. Follow your caregiver's recommendations for frequency of eye exams.  Eat a healthy diet. Foods like vegetables, fruits, whole grains, low-fat dairy products, and lean protein foods contain the nutrients you need without too many calories. Decrease your intake of foods high in solid fats, added sugars, and salt. Eat the right amount of calories for you.Get information about a proper diet from your caregiver, if necessary.  Regular physical exercise is one of the most important things you can do for your health. Most adults should get at least 150 minutes of moderate-intensity exercise (any activity that increases your heart rate and causes you to sweat) each week. In addition, most adults need muscle-strengthening exercises on 2 or more days a week.  Maintain a healthy weight. The body mass index (BMI) is a screening tool to identify possible weight problems. It provides an estimate of body fat based on height and weight. Your caregiver can help determine your BMI, and can help you achieve or maintain a healthy weight.For adults 20 years and older:  A BMI below 18.5 is considered underweight.  A BMI of 18.5 to 24.9 is normal.  A BMI of 25 to 29.9 is considered overweight.  A BMI of 30 and above is  considered obese.  Maintain normal blood lipids and cholesterol levels by exercising and minimizing your intake of saturated fat. Eat a balanced diet with plenty of fruit and vegetables. Blood tests for lipids and cholesterol should begin at age 20 and be repeated every 5 years. If your lipid or cholesterol levels are high, you are over 50, or you are at high risk for heart disease, you may need your cholesterol levels checked more frequently.Ongoing high lipid and cholesterol levels should be treated with medicines if diet and exercise are not effective.  If you smoke, find out from your caregiver how to quit. If you do not use tobacco, do not start.  If you are pregnant, do not drink alcohol. If you are breastfeeding, be very cautious about drinking alcohol. If you are not pregnant and choose to drink alcohol, do not exceed 1 drink per day. One drink is considered to be 12 ounces (355 mL) of beer, 5 ounces (148 mL) of wine, or 1.5 ounces (44 mL) of liquor.  Avoid use of street drugs. Do not share needles with anyone. Ask for help if you need support or instructions about stopping the use of drugs.  High blood pressure causes heart disease and increases the risk of stroke. Your blood pressure should be checked at least every 1 to 2 years. Ongoing high blood pressure should be treated with medicines if weight loss and exercise are not effective.  If you are 55 to 60 years old, ask your caregiver if you should take aspirin to prevent strokes.  Diabetes   screening involves taking a blood sample to check your fasting blood sugar level. This should be done once every 3 years, after age 45, if you are within normal weight and without risk factors for diabetes. Testing should be considered at a younger age or be carried out more frequently if you are overweight and have at least 1 risk factor for diabetes.  Breast cancer screening is essential preventive care for women. You should practice "breast  self-awareness." This means understanding the normal appearance and feel of your breasts and may include breast self-examination. Any changes detected, no matter how small, should be reported to a caregiver. Women in their 20s and 30s should have a clinical breast exam (CBE) by a caregiver as part of a regular health exam every 1 to 3 years. After age 40, women should have a CBE every year. Starting at age 40, women should consider having a mammography (breast X-ray test) every year. Women who have a family history of breast cancer should talk to their caregiver about genetic screening. Women at a high risk of breast cancer should talk to their caregivers about having magnetic resonance imaging (MRI) and a mammography every year.  The Pap test is a screening test for cervical cancer. A Pap test can show cell changes on the cervix that might become cervical cancer if left untreated. A Pap test is a procedure in which cells are obtained and examined from the lower end of the uterus (cervix).  Women should have a Pap test starting at age 21.  Between ages 21 and 29, Pap tests should be repeated every 2 years.  Beginning at age 30, you should have a Pap test every 3 years as long as the past 3 Pap tests have been normal.  Some women have medical problems that increase the chance of getting cervical cancer. Talk to your caregiver about these problems. It is especially important to talk to your caregiver if a new problem develops soon after your last Pap test. In these cases, your caregiver may recommend more frequent screening and Pap tests.  The above recommendations are the same for women who have or have not gotten the vaccine for human papillomavirus (HPV).  If you had a hysterectomy for a problem that was not cancer or a condition that could lead to cancer, then you no longer need Pap tests. Even if you no longer need a Pap test, a regular exam is a good idea to make sure no other problems are  starting.  If you are between ages 65 and 70, and you have had normal Pap tests going back 10 years, you no longer need Pap tests. Even if you no longer need a Pap test, a regular exam is a good idea to make sure no other problems are starting.  If you have had past treatment for cervical cancer or a condition that could lead to cancer, you need Pap tests and screening for cancer for at least 20 years after your treatment.  If Pap tests have been discontinued, risk factors (such as a new sexual partner) need to be reassessed to determine if screening should be resumed.  The HPV test is an additional test that may be used for cervical cancer screening. The HPV test looks for the virus that can cause the cell changes on the cervix. The cells collected during the Pap test can be tested for HPV. The HPV test could be used to screen women aged 30 years and older, and should   be used in women of any age who have unclear Pap test results. After the age of 30, women should have HPV testing at the same frequency as a Pap test.  Colorectal cancer can be detected and often prevented. Most routine colorectal cancer screening begins at the age of 50 and continues through age 75. However, your caregiver may recommend screening at an earlier age if you have risk factors for colon cancer. On a yearly basis, your caregiver may provide home test kits to check for hidden blood in the stool. Use of a small camera at the end of a tube, to directly examine the colon (sigmoidoscopy or colonoscopy), can detect the earliest forms of colorectal cancer. Talk to your caregiver about this at age 50, when routine screening begins. Direct examination of the colon should be repeated every 5 to 10 years through age 75, unless early forms of pre-cancerous polyps or small growths are found.  Hepatitis C blood testing is recommended for all people born from 1945 through 1965 and any individual with known risks for hepatitis C.  Practice  safe sex. Use condoms and avoid high-risk sexual practices to reduce the spread of sexually transmitted infections (STIs). STIs include gonorrhea, chlamydia, syphilis, trichomonas, herpes, HPV, and human immunodeficiency virus (HIV). Herpes, HIV, and HPV are viral illnesses that have no cure. They can result in disability, cancer, and death. Sexually active women aged 25 and younger should be checked for chlamydia. Older women with new or multiple partners should also be tested for chlamydia. Testing for other STIs is recommended if you are sexually active and at increased risk.  Osteoporosis is a disease in which the bones lose minerals and strength with aging. This can result in serious bone fractures. The risk of osteoporosis can be identified using a bone density scan. Women ages 65 and over and women at risk for fractures or osteoporosis should discuss screening with their caregivers. Ask your caregiver whether you should take a calcium supplement or vitamin D to reduce the rate of osteoporosis.  Menopause can be associated with physical symptoms and risks. Hormone replacement therapy is available to decrease symptoms and risks. You should talk to your caregiver about whether hormone replacement therapy is right for you.  Use sunscreen with sun protection factor (SPF) of 30 or more. Apply sunscreen liberally and repeatedly throughout the day. You should seek shade when your shadow is shorter than you. Protect yourself by wearing long sleeves, pants, a wide-brimmed hat, and sunglasses year round, whenever you are outdoors.  Once a month, do a whole body skin exam, using a mirror to look at the skin on your back. Notify your caregiver of new moles, moles that have irregular borders, moles that are larger than a pencil eraser, or moles that have changed in shape or color.  Stay current with required immunizations.  Influenza. You need a dose every fall (or winter). The composition of the flu vaccine  changes each year, so being vaccinated once is not enough.  Pneumococcal polysaccharide. You need 1 to 2 doses if you smoke cigarettes or if you have certain chronic medical conditions. You need 1 dose at age 65 (or older) if you have never been vaccinated.  Tetanus, diphtheria, pertussis (Tdap, Td). Get 1 dose of Tdap vaccine if you are younger than age 65, are over 65 and have contact with an infant, are a healthcare worker, are pregnant, or simply want to be protected from whooping cough. After that, you need a Td   booster dose every 10 years. Consult your caregiver if you have not had at least 3 tetanus and diphtheria-containing shots sometime in your life or have a deep or dirty wound.  HPV. You need this vaccine if you are a woman age 26 or younger. The vaccine is given in 3 doses over 6 months.  Measles, mumps, rubella (MMR). You need at least 1 dose of MMR if you were born in 1957 or later. You may also need a second dose.  Meningococcal. If you are age 19 to 21 and a first-year college student living in a residence hall, or have one of several medical conditions, you need to get vaccinated against meningococcal disease. You may also need additional booster doses.  Zoster (shingles). If you are age 60 or older, you should get this vaccine.  Varicella (chickenpox). If you have never had chickenpox or you were vaccinated but received only 1 dose, talk to your caregiver to find out if you need this vaccine.  Hepatitis A. You need this vaccine if you have a specific risk factor for hepatitis A virus infection or you simply wish to be protected from this disease. The vaccine is usually given as 2 doses, 6 to 18 months apart.  Hepatitis B. You need this vaccine if you have a specific risk factor for hepatitis B virus infection or you simply wish to be protected from this disease. The vaccine is given in 3 doses, usually over 6 months. Preventive Services / Frequency Ages 19 to 39  Blood  pressure check.** / Every 1 to 2 years.  Lipid and cholesterol check.** / Every 5 years beginning at age 20.  Clinical breast exam.** / Every 3 years for women in their 20s and 30s.  Pap test.** / Every 2 years from ages 21 through 29. Every 3 years starting at age 30 through age 65 or 70 with a history of 3 consecutive normal Pap tests.  HPV screening.** / Every 3 years from ages 30 through ages 65 to 70 with a history of 3 consecutive normal Pap tests.  Hepatitis C blood test.** / For any individual with known risks for hepatitis C.  Skin self-exam. / Monthly.  Influenza immunization.** / Every year.  Pneumococcal polysaccharide immunization.** / 1 to 2 doses if you smoke cigarettes or if you have certain chronic medical conditions.  Tetanus, diphtheria, pertussis (Tdap, Td) immunization. / A one-time dose of Tdap vaccine. After that, you need a Td booster dose every 10 years.  HPV immunization. / 3 doses over 6 months, if you are 26 and younger.  Measles, mumps, rubella (MMR) immunization. / You need at least 1 dose of MMR if you were born in 1957 or later. You may also need a second dose.  Meningococcal immunization. / 1 dose if you are age 19 to 21 and a first-year college student living in a residence hall, or have one of several medical conditions, you need to get vaccinated against meningococcal disease. You may also need additional booster doses.  Varicella immunization.** / Consult your caregiver.  Hepatitis A immunization.** / Consult your caregiver. 2 doses, 6 to 18 months apart.  Hepatitis B immunization.** / Consult your caregiver. 3 doses usually over 6 months. Ages 40 to 64  Blood pressure check.** / Every 1 to 2 years.  Lipid and cholesterol check.** / Every 5 years beginning at age 20.  Clinical breast exam.** / Every year after age 40.  Mammogram.** / Every year beginning at age 40   and continuing for as long as you are in good health. Consult with your  caregiver.  Pap test.** / Every 3 years starting at age 30 through age 65 or 70 with a history of 3 consecutive normal Pap tests.  HPV screening.** / Every 3 years from ages 30 through ages 65 to 70 with a history of 3 consecutive normal Pap tests.  Fecal occult blood test (FOBT) of stool. / Every year beginning at age 50 and continuing until age 75. You may not need to do this test if you get a colonoscopy every 10 years.  Flexible sigmoidoscopy or colonoscopy.** / Every 5 years for a flexible sigmoidoscopy or every 10 years for a colonoscopy beginning at age 50 and continuing until age 75.  Hepatitis C blood test.** / For all people born from 1945 through 1965 and any individual with known risks for hepatitis C.  Skin self-exam. / Monthly.  Influenza immunization.** / Every year.  Pneumococcal polysaccharide immunization.** / 1 to 2 doses if you smoke cigarettes or if you have certain chronic medical conditions.  Tetanus, diphtheria, pertussis (Tdap, Td) immunization.** / A one-time dose of Tdap vaccine. After that, you need a Td booster dose every 10 years.  Measles, mumps, rubella (MMR) immunization. / You need at least 1 dose of MMR if you were born in 1957 or later. You may also need a second dose.  Varicella immunization.** / Consult your caregiver.  Meningococcal immunization.** / Consult your caregiver.  Hepatitis A immunization.** / Consult your caregiver. 2 doses, 6 to 18 months apart.  Hepatitis B immunization.** / Consult your caregiver. 3 doses, usually over 6 months. Ages 65 and over  Blood pressure check.** / Every 1 to 2 years.  Lipid and cholesterol check.** / Every 5 years beginning at age 20.  Clinical breast exam.** / Every year after age 40.  Mammogram.** / Every year beginning at age 40 and continuing for as long as you are in good health. Consult with your caregiver.  Pap test.** / Every 3 years starting at age 30 through age 65 or 70 with a 3  consecutive normal Pap tests. Testing can be stopped between 65 and 70 with 3 consecutive normal Pap tests and no abnormal Pap or HPV tests in the past 10 years.  HPV screening.** / Every 3 years from ages 30 through ages 65 or 70 with a history of 3 consecutive normal Pap tests. Testing can be stopped between 65 and 70 with 3 consecutive normal Pap tests and no abnormal Pap or HPV tests in the past 10 years.  Fecal occult blood test (FOBT) of stool. / Every year beginning at age 50 and continuing until age 75. You may not need to do this test if you get a colonoscopy every 10 years.  Flexible sigmoidoscopy or colonoscopy.** / Every 5 years for a flexible sigmoidoscopy or every 10 years for a colonoscopy beginning at age 50 and continuing until age 75.  Hepatitis C blood test.** / For all people born from 1945 through 1965 and any individual with known risks for hepatitis C.  Osteoporosis screening.** / A one-time screening for women ages 65 and over and women at risk for fractures or osteoporosis.  Skin self-exam. / Monthly.  Influenza immunization.** / Every year.  Pneumococcal polysaccharide immunization.** / 1 dose at age 65 (or older) if you have never been vaccinated.  Tetanus, diphtheria, pertussis (Tdap, Td) immunization. / A one-time dose of Tdap vaccine if you are over   65 and have contact with an infant, are a healthcare worker, or simply want to be protected from whooping cough. After that, you need a Td booster dose every 10 years.  Varicella immunization.** / Consult your caregiver.  Meningococcal immunization.** / Consult your caregiver.  Hepatitis A immunization.** / Consult your caregiver. 2 doses, 6 to 18 months apart.  Hepatitis B immunization.** / Check with your caregiver. 3 doses, usually over 6 months. ** Family history and personal history of risk and conditions may change your caregiver's recommendations. Document Released: 06/28/2001 Document Revised: 07/25/2011  Document Reviewed: 09/27/2010 [employer] Patient Information 2013 [employer].  

## 2012-09-17 NOTE — Progress Notes (Signed)
Subjective:    Patient ID: Diane Day, female    DOB: August 20, 1950, 62 y.o.   MRN: [id number]  Hypertension This is a chronic problem. The current episode started more than 1 year ago. The problem has been rapidly improving since onset. The problem is controlled. Pertinent negatives include no anxiety, blurred vision, chest pain, headaches, malaise/fatigue, neck pain, orthopnea, palpitations, peripheral edema, PND, shortness of breath or sweats. There are no associated agents to hypertension. Past treatments include angiotensin blockers and diuretics. The current treatment provides significant improvement. There are no compliance problems.       Review of Systems  Constitutional: Negative.  Negative for malaise/fatigue.  HENT: Negative.  Negative for neck pain.   Eyes: Negative.  Negative for blurred vision.  Respiratory: Negative.  Negative for shortness of breath.   Cardiovascular: Negative.  Negative for chest pain, palpitations, orthopnea and PND.  Gastrointestinal: Negative.   Endocrine: Negative.   Genitourinary: Negative.   Musculoskeletal: Negative.   Allergic/Immunologic: Negative.   Neurological: Negative.  Negative for headaches.  Hematological: Negative.   Psychiatric/Behavioral: Negative.        Objective:   Physical Exam  Vitals reviewed. Constitutional: She is oriented to person, place, and time. She appears well-developed and well-nourished. No distress.  HENT:  Head: Normocephalic and atraumatic.  Mouth/Throat: Oropharynx is clear and moist. No oropharyngeal exudate.  Eyes: Conjunctivae are normal. Right eye exhibits no discharge. Left eye exhibits no discharge. No scleral icterus.  Neck: Normal range of motion. Neck supple. No JVD present. No tracheal deviation present. No thyromegaly present.  Cardiovascular: Normal rate, regular rhythm, normal heart sounds and intact distal pulses.  Exam reveals no gallop and no friction rub.   No murmur  heard. Pulmonary/Chest: Effort normal and breath sounds normal. No accessory muscle usage or stridor. Not tachypneic. No respiratory distress. She has no wheezes. She has no rales. Chest wall is not dull to percussion. She exhibits no mass, no tenderness, no bony tenderness, no laceration, no crepitus, no edema, no deformity, no swelling and no retraction. Right breast exhibits no inverted nipple, no mass, no nipple discharge, no skin change and no tenderness. Left breast exhibits no inverted nipple, no mass, no nipple discharge, no skin change and no tenderness. Breasts are symmetrical.  Abdominal: Soft. Bowel sounds are normal. She exhibits no distension and no mass. There is no tenderness. There is no rebound and no guarding.  Musculoskeletal: Normal range of motion. She exhibits no edema and no tenderness.  Lymphadenopathy:    She has no cervical adenopathy.  Neurological: She is oriented to person, place, and time.  Skin: Skin is warm and dry. No rash noted. She is not diaphoretic. No erythema. No pallor.  Psychiatric: She has a normal mood and affect. Her behavior is normal. Judgment and thought content normal.     Lab Results  Component Value Date   WBC 5.5 12/02/2011   HGB 14.2 12/02/2011   HCT 42.7 12/02/2011   PLT 211.0 12/02/2011   GLUCOSE 107* 12/02/2011   CHOL 200 12/02/2011   TRIG 43.0 12/02/2011   HDL 90.70 12/02/2011   LDLCALC 101* 12/02/2011   ALT 17 12/02/2011   AST 19 12/02/2011   NA 144 12/02/2011   K 3.9 12/02/2011   CL 109 12/02/2011   CREATININE 0.9 12/02/2011   BUN 15 12/02/2011   CO2 30 12/02/2011   TSH 0.56 12/02/2011   HGBA1C 5.7 12/02/2011       Assessment & Plan:

## 2012-09-17 NOTE — Assessment & Plan Note (Signed)
Exam done Labs ordered Pt ed material was given 

## 2012-09-17 NOTE — Assessment & Plan Note (Signed)
She is due for a DEXA scan 

## 2012-09-18 ENCOUNTER — Encounter: Payer: Self-pay | Admitting: Internal Medicine

## 2012-09-18 LAB — VITAMIN D 25 HYDROXY (VIT D DEFICIENCY, FRACTURES): Vit D, 25-Hydroxy: 44 ng/mL (ref 30–89)

## 2012-09-25 ENCOUNTER — Encounter: Payer: Self-pay | Admitting: Obstetrics and Gynecology

## 2012-10-09 ENCOUNTER — Other Ambulatory Visit: Payer: Self-pay | Admitting: Internal Medicine

## 2012-11-02 ENCOUNTER — Ambulatory Visit (INDEPENDENT_AMBULATORY_CARE_PROVIDER_SITE_OTHER): Payer: BC Managed Care – PPO | Admitting: Family Medicine

## 2012-11-02 DIAGNOSIS — T6391XA Toxic effect of contact with unspecified venomous animal, accidental (unintentional), initial encounter: Secondary | ICD-10-CM

## 2012-11-02 MED ORDER — PREDNISONE 20 MG PO TABS: ORAL_TABLET | ORAL | Status: DC

## 2012-11-02 MED ORDER — METHYLPREDNISOLONE ACETATE 80 MG/ML IJ SUSP
80.0000 mg | Freq: Once | INTRAMUSCULAR | Status: AC
  Administered 2012-11-02: 80 mg via INTRAMUSCULAR

## 2012-11-02 NOTE — Patient Instructions (Addendum)
Take Zyrtec 5 mg twice daily  Take prednisone 20 mg 3 tablets each morning for 2 days, then 2 tablets for 2 days, then one tablet for 2 days   Return if problems or concerns

## 2012-11-02 NOTE — Progress Notes (Signed)
Subjective: 62 year old lady who was working in her garden yesterday. She noticed an itching place on the right forearm just below the antecubital fossa. It turned red. This morning she marked the borders of it which weren't erythematous area almost 3 inches in diameter. This afternoon she had developed a rash from just below the axilla to just above the wrists.  Knows of no specific allergens. She doesn't know what bit her.  Objective: Large area of particularly the rash from mid upper arm to just above the wrist. Centrally there is a 3-4 inch diameter area of bright erythema. No axillary node be palpated. In the middle of the red spot there is a punctate oriented area that looks like the place she might have been stung.  Assessment: Insect ice or sting right forearm with large local reaction  Plan: Steroids and antihistamines. Return if in all worse.

## 2012-11-07 ENCOUNTER — Ambulatory Visit: Payer: Self-pay | Admitting: Obstetrics and Gynecology

## 2012-11-19 ENCOUNTER — Ambulatory Visit: Payer: Self-pay | Admitting: Obstetrics and Gynecology

## 2012-11-27 ENCOUNTER — Encounter: Payer: Self-pay | Admitting: Obstetrics and Gynecology

## 2012-11-30 ENCOUNTER — Ambulatory Visit (INDEPENDENT_AMBULATORY_CARE_PROVIDER_SITE_OTHER): Payer: BC Managed Care – PPO | Admitting: Obstetrics and Gynecology

## 2012-11-30 ENCOUNTER — Encounter: Payer: Self-pay | Admitting: Obstetrics and Gynecology

## 2012-11-30 VITALS — BP 112/68 | HR 82 | Resp 18 | Ht 67.0 in | Wt 132.0 lb

## 2012-11-30 DIAGNOSIS — Z01419 Encounter for gynecological examination (general) (routine) without abnormal findings: Secondary | ICD-10-CM

## 2012-11-30 DIAGNOSIS — Z803 Family history of malignant neoplasm of breast: Secondary | ICD-10-CM

## 2012-11-30 DIAGNOSIS — Z23 Encounter for immunization: Secondary | ICD-10-CM

## 2012-11-30 NOTE — Progress Notes (Addendum)
62 y.o.  Married   Caucasian   female   [id number]   here for annual exam.  Having some problem with fecal urgency, and is increasing her fiber.    [name] was 05/16/2006.          Sexually active: yes  [name]    Exercising: walking 7 days a week Last mammogram:  09/2012 normal Last pap smear:10/14/09 Asc-us neg HPV History of abnormal pap: 10/2009 Smoking: quit in 1977 Alcohol:occ glass of wine, drink of choice beer Last colonoscopy:09/2012 normal repeat in 10 years Last Bone Density:  10/2007 osteopenia Last tetanus shot: 2004 Last cholesterol check: 09/18/12 normal  Hgb:  pcp  (13.4)            Urine:pcp   Family History  Problem Relation Age of Onset  . Cancer Father     esophageal  . Diabetes Father   . Heart disease Father   . Hypertension Father   . Esophageal cancer Father   . Breast cancer Mother 76  . Hypertension Mother   . Osteoporosis Mother   . Breast cancer Sister   . Colon cancer Neg Hx   . Stomach cancer Neg Hx   . Rectal cancer Neg Hx   . Breast cancer Sister     Patient Active Problem List   Diagnosis Date Noted  . Routine general medical examination at a health care facility 09/17/2012  . Osteopenia 09/17/2012  . Other abnormal glucose 12/02/2011  . Pure hypercholesterolemia 12/02/2011  . Back pain, thoracic 11/03/2011  . HYPERTENSION 09/19/2008    Past Medical History  Diagnosis Date  . Rectal bleeding   . Hypertension   . Osteopenia   . Cancer     basal cell, face    Past Surgical History  Procedure Laterality Date  . Repair broken arm  2008  . Tubal ligation  1994  . Breast surgery Left 1998    remove fibroadenoma  . Knee arthroscopy Left     Allergies: Review of patient's allergies indicates no known allergies.  Current Outpatient Prescriptions  Medication Sig Dispense Refill  . calcium carbonate (TUMS - DOSED IN MG ELEMENTAL CALCIUM) 500 MG chewable  tablet Chew 1 tablet by mouth as needed.       . Cholecalciferol (VITAMIN D3) 2000 UNITS TABS Take by mouth every morning.        Marland Kitchen losartan-hydrochlorothiazide (HYZAAR) 100-12.5 MG per tablet TAKE 1 TABLET ONCE DAILY.  30 tablet  11  . Multiple Vitamin (MULTIVITAMIN) tablet Take 1 tablet by mouth daily.        . predniSONE (DELTASONE) 20 MG tablet Take 3 daily for 2 days, then 2 daily for 2 days, then 1 daily for 2 days  12 tablet  0  . Probiotic Product (PROBIOTIC PO) Take by mouth.         No current facility-administered medications for this visit.   Off prednisone, was only on it briefly for a rash ROS: Pertinent items are noted in HPI.  Social YN:WGNFAOZ, three children, works in Physicist, medical at [employer]  Exam:    BP 112/68  Pulse 82  Resp 18  Ht 5\' 7"  (1.702 m)  Wt 132 lb (59.875 kg)  BMI 20.67 kg/m2  LMP 05/16/2006  Ht stable and wt down 2 pounds from last year Wt Readings from Last 3 Encounters:  11/30/12 132 lb (59.875 kg)  11/02/12 135 lb (  61.236 kg)  09/17/12 135 lb (61.236 kg)     Ht Readings from Last 3 Encounters:  11/30/12 5\' 7"  (1.702 m)  11/02/12 5\' 7"  (1.702 m)  08/29/12 5\' 7"  (1.702 m)    General appearance: alert, cooperative and appears stated age Head: Normocephalic, without obvious abnormality, atraumatic Neck: no adenopathy, supple, symmetrical, trachea midline and thyroid not enlarged, symmetric, no tenderness/mass/nodules Lungs: clear to auscultation bilaterally Breasts: Inspection negative, No nipple retraction or dimpling, No nipple discharge or bleeding, No axillary or supraclavicular adenopathy, Normal to palpation without dominant masses Heart: regular rate and rhythm Abdomen: soft, non-tender; bowel sounds normal; no masses,  no organomegaly Extremities: extremities normal, atraumatic, no cyanosis or edema Skin: Skin color, texture, turgor normal. No rashes or lesions Lymph nodes: Cervical, supraclavicular, and axillary nodes  normal. No abnormal inguinal nodes palpated Neurologic: Grossly normal   Pelvic: External genitalia:  no lesions              Urethra:  normal appearing urethra with no masses, tenderness or lesions              Bartholins and Skenes: normal                 Vagina: normal appearing vagina with normal color and discharge, no lesions              Cervix: normal appearance              Pap taken: yes        Bimanual Exam:  Uterus:  uterus is normal size, shape, consistency and nontender                                      Adnexa: normal adnexa in size, nontender and no masses                                      Rectovaginal: Confirms                                      Anus:  decreasedl sphincter tone, no lesions  A: normal menopausal exam, no Hrt     P: mammogram pap smear counseled on adequate intake of calcium and vitamin D, diet and exercise return annually or prn   Have discussed option for genetic counselling with pt due to FH breast cancer, pt concerned about insurance coverage and knows it will be very expensive d/t high deductible , and pt's sister was neg for BrCa.  After discussing it with me, she agrees to go for counselling.  Will refer.  Kegel's for rectum, increased fiber.     An After Visit Summary was printed and given to the patient.  12/04/2012  Pap came back showing ASCUS - HPV.  Repeat in 3 years.  CPR, MD

## 2012-11-30 NOTE — Patient Instructions (Signed)

## 2012-12-04 LAB — IPS PAP TEST WITH HPV

## 2012-12-05 ENCOUNTER — Telehealth: Payer: Self-pay | Admitting: *Deleted

## 2012-12-05 ENCOUNTER — Telehealth: Payer: Self-pay | Admitting: Genetic Counselor

## 2012-12-05 NOTE — Telephone Encounter (Signed)
LVOM FOR PT TO RETURN CALL IN RE GENETIC APPT.  °

## 2012-12-05 NOTE — Telephone Encounter (Signed)
Referral form sent to [facility] on 11/30/2012 per Amy Abernathy,RN. 12/04/2012 York Cerise with [facility] left VM for patient to return call in RE: Genetic appt.

## 2012-12-05 NOTE — Telephone Encounter (Signed)
Message copied by Elnora Morrison on Wed Dec 05, 2012  9:21 AM ------      Message from: Alison Murray      Created: Fri Nov 30, 2012  9:06 AM       Please get pt an appt with genetic counselor re FH breast cancer. ------

## 2012-12-11 ENCOUNTER — Telehealth: Payer: Self-pay | Admitting: *Deleted

## 2012-12-11 NOTE — Telephone Encounter (Signed)
Patient has appt. At [facility] on 02/04/2013 @ 10;00am. Patient aware of this.

## 2013-02-04 ENCOUNTER — Other Ambulatory Visit: Payer: Self-pay | Admitting: Lab

## 2013-02-04 ENCOUNTER — Encounter: Payer: Self-pay | Admitting: Genetic Counselor

## 2013-02-04 ENCOUNTER — Ambulatory Visit (HOSPITAL_BASED_OUTPATIENT_CLINIC_OR_DEPARTMENT_OTHER): Payer: BC Managed Care – PPO | Admitting: Genetic Counselor

## 2013-02-04 DIAGNOSIS — Z803 Family history of malignant neoplasm of breast: Secondary | ICD-10-CM

## 2013-02-04 DIAGNOSIS — IMO0002 Reserved for concepts with insufficient information to code with codable children: Secondary | ICD-10-CM

## 2013-02-04 NOTE — Progress Notes (Signed)
Dr.  Aram Beecham Romine requested a consultation for genetic counseling and risk assessment for Diane Day, a 62 y.o. female, for discussion of her family history of breast cancer.  She presents to clinic today to discuss the possibility of a genetic predisposition to cancer, and to further clarify her risks, as well as her family members' risks for cancer.   HISTORY OF PRESENT ILLNESS: Diane Day is a 62 y.o. female with no personal history of cancer.  She has had one previous breast biopsy that was found to be a fibroadenoma.  She has had two colonoscopies, neither of which have found polyps.    Past Medical History  Diagnosis Date  . Rectal bleeding   . Hypertension   . Osteopenia   . Cancer     basal cell, face    Past Surgical History  Procedure Laterality Date  . Repair broken arm  2008  . Tubal ligation  1994  . Breast surgery Left 1998    remove fibroadenoma  . Knee arthroscopy Left     History   Social History  . Marital Status: Unknown    Spouse Name: N/A    Number of Children: 3  . Years of Education: N/A   Occupational History  .     Social History Main Topics  . Smoking status: Former Smoker -- 0.50 packs/day for 5 years    Types: Cigarettes    Quit date: 12/02/1974  . Smokeless tobacco: Never Used  . Alcohol Use: 4.2 oz/week    7 Glasses of wine per week     Comment: occ glass of wine, drink of choice beer  . Drug Use: No  . Sexual Activity: Yes    Partners: Female    Birth Control/ Protection: Post-menopausal   Other Topics Concern  . None   Social History Narrative   Daily caffeine use    REPRODUCTIVE HISTORY AND PERSONAL RISK ASSESSMENT FACTORS: Menarche was at age 68.   postmenopausal Uterus Intact: yes Ovaries Intact: yes G3P3A0, first live birth at age 10  She has not previously undergone treatment for infertility.   Oral Contraceptive use: 0 years   She has not used HRT in the past.    FAMILY HISTORY:  We  obtained a detailed, 4-generation family history.  Significant diagnoses are listed below: Family History  Problem Relation Age of Onset  . Diabetes Father   . Heart disease Father   . Hypertension Father   . Esophageal cancer Father 3  . Breast cancer Mother 40  . Hypertension Mother   . Osteoporosis Mother   . Breast cancer Sister 42    BRCA negative  . Colon cancer Neg Hx   . Stomach cancer Neg Hx   . Rectal cancer Neg Hx   . Breast cancer Sister 41  . Cancer Paternal Aunt     unknown form of cancer over the age of 58  . Thyroid cancer Cousin     maternal first cousin  . Breast cancer Cousin     died in her 19s; paternal cousin  . Breast cancer Cousin     paternal first cousin  The patient's sister has had genetic testing in [place] within the past year.  She is unaware of whether it was BRCA testing alone or if she had a fully panel.  Testing was negative.  The patient's maternal uncle had a daughter with thyroid cancer.  Of note, his wife, the cousin's mother, died of breast  cancer.  Diane Day has 10 paternal aunts and uncles.  One aunt had an unknown cancer and did not have children.  She has two cousins on her father's side with breast cancer.  Patient's maternal ancestors are of [place] descent, and paternal ancestors are of [place] descent. There is no reported Ashkenazi Jewish ancestry. There is no known consanguinity.  GENETIC COUNSELING ASSESSMENT: Diane Day is a 62 y.o. female with a family history of breast cancer which somewhat suggestive of a hereditary breast cancer syndrome and predisposition to cancer. We, therefore, discussed and recommended the following at today's visit.   DISCUSSION: We reviewed the characteristics, features and inheritance patterns of hereditary cancer syndromes. We also discussed genetic testing, including the appropriate family members to test, the process of testing, insurance coverage and turn-around-time for  results. Based on her family history of breast cancer, she meets medical criteria for genetic testing.  However, her sister, who is the most informative person in the family to test, was negative on her BRCA testing. We reviewed that Diane Day could still test positive for a mutation in a hereditary breast cancer gene, even though her sister is negative.    In order to estimate her chance of having a BRCA mutation, we used statistical models (Penn II, Myriad Risk Assessment and Tyrer Cusik) and laboratory data that take into account her personal medical history, family history and ancestry.  Because each model is different, there can be a lot of variability in the risks they give.  Therefore, these numbers must be considered a rough range and not a precise risk of having a BRCA mutation.  These models estimate that she has approximately a 0.3-3% chance of having a mutation. Based on this assessment of her family and personal history, genetic testing is no recommended.  Based on the patient's personal and family history, statistical models Dondra Spry Model and [employer])  and literature data were used to estimate her risk of developing breast cancer. These estimate her lifetime risk of developing breast cancer to be approximately 31% to 37%. This estimation does not take into account any genetic testing results. Based on this information, a discussion about the utility of a breast MRI would be important.  PLAN: After considering the risks, benefits, and limitations, Diane Day declined testing. We encouraged Diane Day to remain in contact with cancer genetics annually so that we can continuously update the family history and inform her of any changes in cancer genetics and testing that may be of benefit for her family. Diane Day's questions were answered to her satisfaction today. Our contact information was provided should additional questions or concerns arise.  The patient was seen  for a total of 60 minutes, greater than 50% of which was spent face-to-face counseling.  This plan is was reviewed with Dr. Drue Second.  This note will also be sent to the referring provider via the electronic medical record. The patient will be supplied with a summary of this genetic counseling discussion as well as educational information on the discussed hereditary cancer syndromes following the conclusion of their visit.   Patient was discussed with Dr. Drue Second.   _______________________________________________________________________ For Office Staff:  Number of people involved in session: 1 Was an Intern/ student involved with case: yes

## 2013-02-09 ENCOUNTER — Ambulatory Visit (INDEPENDENT_AMBULATORY_CARE_PROVIDER_SITE_OTHER): Payer: BC Managed Care – PPO | Admitting: Internal Medicine

## 2013-02-09 VITALS — BP 132/84 | HR 83 | Temp 98.2°F | Resp 16 | Ht 67.0 in | Wt 132.2 lb

## 2013-02-09 DIAGNOSIS — R21 Rash and other nonspecific skin eruption: Secondary | ICD-10-CM

## 2013-02-09 DIAGNOSIS — L0103 Bullous impetigo: Secondary | ICD-10-CM

## 2013-02-09 LAB — POCT CBC
HCT, POC: 42.9 % (ref 37.7–47.9)
Lymph, poc: 2.2 (ref 0.6–3.4)
MCH, POC: 30.3 pg (ref 27–31.2)
MCV: 95.6 fL (ref 80–97)
MID (cbc): 0.4 (ref 0–0.9)
POC LYMPH PERCENT: 39.2 %L (ref 10–50)
Platelet Count, POC: 235 10*3/uL (ref 142–424)
RDW, POC: 13.5 %
WBC: 5.7 10*3/uL (ref 4.6–10.2)

## 2013-02-09 MED ORDER — MUPIROCIN 2 % EX OINT: TOPICAL_OINTMENT | Freq: Three times a day (TID) | CUTANEOUS | Status: DC

## 2013-02-09 MED ORDER — CEPHALEXIN 500 MG PO CAPS: 500.0000 mg | ORAL_CAPSULE | Freq: Three times a day (TID) | ORAL | Status: DC

## 2013-02-09 NOTE — Progress Notes (Signed)
  Subjective:    Patient ID: Diane Day, female    DOB: 1951/02/11, 62 y.o.   MRN: [id number]  HPI Four-day history of a pruritic rash with new lesions daily Starts with small blister or of the circle which rapidly ruptures and becomes red and slightly crusty  located on chest and extremities and around the waistband and inguinal creases No fleas at home Husband who sleeps in same bed is not affected No travel exposures Had chickenpox as a child  Review of Systems No fever chills or night sweats No new medications No intercurrent illnesses    Objective:   Physical Exam BP 132/84  Pulse 83  Temp(Src) 98.2 F (36.8 C) (Oral)  Resp 16  Ht 5\' 7"  (1.702 m)  Wt 132 lb 3.2 oz (59.966 kg)  BMI 20.7 kg/m2  SpO2 100%  LMP 05/16/2006 No acute distress No lymphadenopathy Numerous red papules on back, chest, waistband, arms and legs 2 new lesions are bullous/a few older lesions are scabbed Most lesions are slightly crusted and flat   Results for orders placed in visit on 02/09/13  POCT CBC      Result Value Range   WBC 5.7  4.6 - 10.2 K/uL   Lymph, poc 2.2  0.6 - 3.4   POC LYMPH PERCENT 39.2  10 - 50 %L   MID (cbc) 0.4  0 - 0.9   POC MID % 6.6  0 - 12 %M   POC Granulocyte 3.1  2 - 6.9   Granulocyte percent 54.2  37 - 80 %G   RBC 4.49  4.04 - 5.48 M/uL   Hemoglobin 13.6  12.2 - 16.2 g/dL   HCT, POC 78.2  95.6 - 47.9 %   MCV 95.6  80 - 97 fL   MCH, POC 30.3  27 - 31.2 pg   MCHC 31.7 (*) 31.8 - 35.4 g/dL   RDW, POC 21.3     Platelet Count, POC 235  142 - 424 K/uL   MPV 9.7  0 - 99.8 fL       Assessment & Plan:  Bullous impetigo  Meds ordered this encounter  Medications  . cephALEXin (KEFLEX) 500 MG capsule    Sig: Take 1 capsule (500 mg total) by mouth 3 (three) times daily.    Dispense:  30 capsule    Refill:  0  . mupirocin ointment (BACTROBAN) 2 %    Sig: Apply topically 3 (three) times daily.    Dispense:  22 g    Refill:  0   Followup if not well  in one week

## 2013-07-04 ENCOUNTER — Encounter: Payer: Self-pay | Admitting: Internal Medicine

## 2013-07-04 ENCOUNTER — Ambulatory Visit (INDEPENDENT_AMBULATORY_CARE_PROVIDER_SITE_OTHER): Payer: BC Managed Care – PPO | Admitting: Internal Medicine

## 2013-07-04 VITALS — BP 128/82 | HR 80 | Temp 97.5°F | Resp 16 | Wt 136.0 lb

## 2013-07-04 DIAGNOSIS — B029 Zoster without complications: Secondary | ICD-10-CM | POA: Insufficient documentation

## 2013-07-04 MED ORDER — VALACYCLOVIR HCL 1 G PO TABS: 1000.0000 mg | ORAL_TABLET | Freq: Three times a day (TID) | ORAL | Status: DC

## 2013-07-04 MED ORDER — TRIAMCINOLONE ACETONIDE 0.5 % EX CREA: 1.0000 "application " | TOPICAL_CREAM | Freq: Three times a day (TID) | CUTANEOUS | Status: DC

## 2013-07-04 NOTE — Assessment & Plan Note (Signed)
2/15 R flank/abd Valtrex x 10 d Triamc cream Pain is not bad. She will use Triamcinolone prn Sch well w/labs w/Dr Ronnald Ramp

## 2013-07-04 NOTE — Progress Notes (Signed)
   Subjective:    Rash This is a new problem. The current episode started in the past 7 days. The problem has been gradually worsening since onset. The affected locations include the abdomen and back (R). The rash is characterized by burning. She was exposed to nothing. Pertinent negatives include no cough, fatigue, fever or sore throat. Past treatments include nothing. There is no history of allergies.      Review of Systems  Constitutional: Negative for fever and fatigue.  HENT: Negative for sore throat.   Respiratory: Negative for cough.   Skin: Positive for rash.  Neurological: Negative for weakness.       Objective:   Physical Exam  Constitutional: She appears well-developed. No distress.  HENT:  Head: Normocephalic.  Right Ear: External ear normal.  Left Ear: External ear normal.  Nose: Nose normal.  Mouth/Throat: Oropharynx is clear and moist.  Eyes: Conjunctivae are normal. Pupils are equal, round, and reactive to light. Right eye exhibits no discharge. Left eye exhibits no discharge.  Neck: Normal range of motion. Neck supple. No JVD present. No tracheal deviation present. No thyromegaly present.  Cardiovascular: Normal rate, regular rhythm and normal heart sounds.   Pulmonary/Chest: No stridor. No respiratory distress. She has no wheezes.  Abdominal: Soft. Bowel sounds are normal. She exhibits no distension and no mass. There is no tenderness. There is no rebound and no guarding.  Musculoskeletal: She exhibits no edema and no tenderness.  Lymphadenopathy:    She has no cervical adenopathy.  Neurological: She displays normal reflexes. No cranial nerve deficit. She exhibits normal muscle tone. Coordination normal.  Skin: Rash noted. No erythema.  R suprapubic area clusters of papules and vesicles  Psychiatric: She has a normal mood and affect. Her behavior is normal. Judgment and thought content normal.          Assessment & Plan:

## 2013-07-04 NOTE — Progress Notes (Signed)
Pre visit review using our clinic review tool, if applicable. No additional management support is needed unless otherwise documented below in the visit note. 

## 2013-09-19 ENCOUNTER — Encounter: Payer: Self-pay | Admitting: Internal Medicine

## 2013-09-19 ENCOUNTER — Ambulatory Visit (INDEPENDENT_AMBULATORY_CARE_PROVIDER_SITE_OTHER): Payer: BC Managed Care – PPO | Admitting: Internal Medicine

## 2013-09-19 ENCOUNTER — Other Ambulatory Visit (INDEPENDENT_AMBULATORY_CARE_PROVIDER_SITE_OTHER): Payer: BC Managed Care – PPO

## 2013-09-19 VITALS — BP 122/82 | HR 71 | Temp 98.2°F | Resp 16 | Ht 67.0 in | Wt 132.0 lb

## 2013-09-19 DIAGNOSIS — Z0000 Encounter for general adult medical examination without abnormal findings: Secondary | ICD-10-CM

## 2013-09-19 DIAGNOSIS — M899 Disorder of bone, unspecified: Secondary | ICD-10-CM

## 2013-09-19 DIAGNOSIS — R7309 Other abnormal glucose: Secondary | ICD-10-CM

## 2013-09-19 DIAGNOSIS — R0609 Other forms of dyspnea: Secondary | ICD-10-CM

## 2013-09-19 DIAGNOSIS — M949 Disorder of cartilage, unspecified: Secondary | ICD-10-CM

## 2013-09-19 DIAGNOSIS — M8580 Other specified disorders of bone density and structure, unspecified site: Secondary | ICD-10-CM

## 2013-09-19 DIAGNOSIS — I10 Essential (primary) hypertension: Secondary | ICD-10-CM

## 2013-09-19 DIAGNOSIS — R0989 Other specified symptoms and signs involving the circulatory and respiratory systems: Secondary | ICD-10-CM

## 2013-09-19 DIAGNOSIS — R0683 Snoring: Secondary | ICD-10-CM

## 2013-09-19 LAB — URINALYSIS, ROUTINE W REFLEX MICROSCOPIC
Bilirubin Urine: NEGATIVE
Hgb urine dipstick: NEGATIVE
KETONES UR: NEGATIVE
Nitrite: NEGATIVE
RBC / HPF: NONE SEEN (ref 0–?)
Specific Gravity, Urine: <=1.005 — AB (ref 1.000–1.030)
Total Protein, Urine: NEGATIVE
UROBILINOGEN UA: 0.2 (ref 0.0–1.0)
Urine Glucose: NEGATIVE
pH: 7 (ref 5.0–8.0)

## 2013-09-19 LAB — HEMOGLOBIN A1C: HEMOGLOBIN A1C: 5.7 % (ref 4.6–6.5)

## 2013-09-19 LAB — CBC WITH DIFFERENTIAL/PLATELET
Basophils Absolute: 0.1 10*3/uL (ref 0.0–0.1)
Basophils Relative: 1.4 % (ref 0.0–3.0)
EOS PCT: 0.5 % (ref 0.0–5.0)
Eosinophils Absolute: 0 10*3/uL (ref 0.0–0.7)
HEMATOCRIT: 43 % (ref 36.0–46.0)
HEMOGLOBIN: 14.7 g/dL (ref 12.0–15.0)
LYMPHS ABS: 1.8 10*3/uL (ref 0.7–4.0)
Lymphocytes Relative: 34.6 % (ref 12.0–46.0)
MCHC: 34.2 g/dL (ref 30.0–36.0)
MCV: 91.1 fl (ref 78.0–100.0)
MONOS PCT: 9 % (ref 3.0–12.0)
Monocytes Absolute: 0.5 10*3/uL (ref 0.1–1.0)
NEUTROS ABS: 2.8 10*3/uL (ref 1.4–7.7)
Neutrophils Relative %: 54.5 % (ref 43.0–77.0)
Platelets: 234 10*3/uL (ref 150.0–400.0)
RBC: 4.72 Mil/uL (ref 3.87–5.11)
RDW: 13.4 % (ref 11.5–15.5)
WBC: 5.1 10*3/uL (ref 4.0–10.5)

## 2013-09-19 LAB — COMPREHENSIVE METABOLIC PANEL
ALK PHOS: 62 U/L (ref 39–117)
ALT: 21 U/L (ref 0–35)
AST: 23 U/L (ref 0–37)
Albumin: 4.4 g/dL (ref 3.5–5.2)
BUN: 11 mg/dL (ref 6–23)
CALCIUM: 9.5 mg/dL (ref 8.4–10.5)
CHLORIDE: 104 meq/L (ref 96–112)
CO2: 29 mEq/L (ref 19–32)
CREATININE: 0.9 mg/dL (ref 0.4–1.2)
GFR: 72.41 mL/min (ref >60.00)
Glucose, Bld: 97 mg/dL (ref 70–99)
POTASSIUM: 4.4 meq/L (ref 3.5–5.1)
Sodium: 140 mEq/L (ref 135–145)
Total Bilirubin: 1 mg/dL (ref 0.2–1.2)
Total Protein: 6.6 g/dL (ref 6.0–8.3)

## 2013-09-19 LAB — LIPID PANEL
Cholesterol: 189 mg/dL (ref 0–200)
HDL: 85.1 mg/dL (ref >39.00)
LDL Cholesterol: 94 mg/dL (ref 0–99)
Total CHOL/HDL Ratio: 2
Triglycerides: 51 mg/dL (ref 0.0–149.0)
VLDL: 10.2 mg/dL (ref 0.0–40.0)

## 2013-09-19 LAB — TSH: TSH: 0.82 u[IU]/mL (ref 0.35–4.50)

## 2013-09-19 NOTE — Progress Notes (Signed)
Pre visit review using our clinic review tool, if applicable. No additional management support is needed unless otherwise documented below in the visit note. 

## 2013-09-19 NOTE — Progress Notes (Signed)
   Subjective:    Patient ID: Diane Day, female    DOB: [birth year], 63 y.o.   MRN: [id number]  Hypertension This is a chronic problem. The current episode started more than 1 year ago. The problem has been gradually improving since onset. The problem is controlled. Pertinent negatives include no anxiety, blurred vision, chest pain, headaches, malaise/fatigue, neck pain, orthopnea, palpitations, peripheral edema, PND, shortness of breath or sweats. There are no associated agents to hypertension. Past treatments include ACE inhibitors and diuretics. The current treatment provides significant improvement. There are no compliance problems.  Identifiable causes of hypertension include sleep apnea.      Review of Systems  Constitutional: Negative.  Negative for fever, chills, malaise/fatigue, diaphoresis, appetite change, fatigue and unexpected weight change.  HENT: Negative.   Eyes: Negative.  Negative for blurred vision.  Respiratory: Positive for apnea. Negative for cough, choking, chest tightness, shortness of breath, wheezing and stridor.   Cardiovascular: Negative.  Negative for chest pain, palpitations, orthopnea, leg swelling and PND.  Gastrointestinal: Negative.  Negative for nausea, vomiting, abdominal pain, diarrhea, constipation and blood in stool.  Endocrine: Negative.   Genitourinary: Negative.   Musculoskeletal: Negative.  Negative for arthralgias, back pain, neck pain and neck stiffness.  Skin: Negative.   Allergic/Immunologic: Negative.   Neurological: Negative.  Negative for dizziness, tremors, weakness, light-headedness and headaches.  Hematological: Negative.  Negative for adenopathy. Does not bruise/bleed easily.  Psychiatric/Behavioral: Negative.        Objective:   Physical Exam  Vitals reviewed. Constitutional: She is oriented to person, place, and time. She appears well-developed and well-nourished. No distress.  HENT:  Head: Normocephalic and atraumatic.   Mouth/Throat: Oropharynx is clear and moist. No oropharyngeal exudate.  Eyes: Conjunctivae are normal. Right eye exhibits no discharge. Left eye exhibits no discharge. No scleral icterus.  Neck: Normal range of motion. Neck supple. No JVD present. No tracheal deviation present. No thyromegaly present.  Cardiovascular: Normal rate, regular rhythm, normal heart sounds and intact distal pulses.  Exam reveals no gallop and no friction rub.   No murmur heard. Pulmonary/Chest: Effort normal and breath sounds normal. No stridor. No respiratory distress. She has no wheezes. She has no rales. She exhibits no tenderness.  Abdominal: Soft. Bowel sounds are normal. She exhibits no distension and no mass. There is no tenderness. There is no rebound and no guarding.  Musculoskeletal: Normal range of motion. She exhibits no edema and no tenderness.  Lymphadenopathy:    She has no cervical adenopathy.  Neurological: She is oriented to person, place, and time.  Skin: Skin is warm and dry. No rash noted. She is not diaphoretic. No erythema. No pallor.  Psychiatric: She has a normal mood and affect. Her behavior is normal. Judgment and thought content normal.     Lab Results  Component Value Date   WBC 5.7 02/09/2013   HGB 13.6 02/09/2013   HCT 42.9 02/09/2013   PLT 224.0 09/17/2012   GLUCOSE 100* 09/17/2012   CHOL 189 09/17/2012   TRIG 24.0 09/17/2012   HDL 83.10 09/17/2012   LDLCALC 101* 09/17/2012   ALT 18 09/17/2012   AST 24 09/17/2012   NA 142 09/17/2012   K 4.8 09/17/2012   CL 106 09/17/2012   CREATININE 0.8 09/17/2012   BUN 13 09/17/2012   CO2 27 09/17/2012   TSH 0.57 09/17/2012   HGBA1C 5.7 12/02/2011       Assessment & Plan:

## 2013-09-19 NOTE — Patient Instructions (Signed)
Preventive Care for Adults, Female A healthy lifestyle and preventive care can promote health and wellness. Preventive health guidelines for women include the following key practices.  A routine yearly physical is a good way to check with your health care provider about your health and preventive screening. It is a chance to share any concerns and updates on your health and to receive a thorough exam.  Visit your dentist for a routine exam and preventive care every 6 months. Brush your teeth twice a day and floss once a day. Good oral hygiene prevents tooth decay and gum disease.  The frequency of eye exams is based on your age, health, family medical history, use of contact lenses, and other factors. Follow your health care provider's recommendations for frequency of eye exams.  Eat a healthy diet. Foods like vegetables, fruits, whole grains, low-fat dairy products, and lean protein foods contain the nutrients you need without too many calories. Decrease your intake of foods high in solid fats, added sugars, and salt. Eat the right amount of calories for you.Get information about a proper diet from your health care provider, if necessary.  Regular physical exercise is one of the most important things you can do for your health. Most adults should get at least 150 minutes of moderate-intensity exercise (any activity that increases your heart rate and causes you to sweat) each week. In addition, most adults need muscle-strengthening exercises on 2 or more days a week.  Maintain a healthy weight. The body mass index (BMI) is a screening tool to identify possible weight problems. It provides an estimate of body fat based on height and weight. Your health care provider can find your BMI, and can help you achieve or maintain a healthy weight.For adults 20 years and older:  A BMI below 18.5 is considered underweight.  A BMI of 18.5 to 24.9 is normal.  A BMI of 25 to 29.9 is considered overweight.  A  BMI of 30 and above is considered obese.  Maintain normal blood lipids and cholesterol levels by exercising and minimizing your intake of saturated fat. Eat a balanced diet with plenty of fruit and vegetables. Blood tests for lipids and cholesterol should begin at age 62 and be repeated every 5 years. If your lipid or cholesterol levels are high, you are over 50, or you are at high risk for heart disease, you may need your cholesterol levels checked more frequently.Ongoing high lipid and cholesterol levels should be treated with medicines if diet and exercise are not working.  If you smoke, find out from your health care provider how to quit. If you do not use tobacco, do not start.  Lung cancer screening is recommended for adults aged 36 80 years who are at high risk for developing lung cancer because of a history of smoking. A yearly low-dose CT scan of the lungs is recommended for people who have at least a 30-pack-year history of smoking and are a current smoker or have quit within the past 15 years. A pack year of smoking is smoking an average of 1 pack of cigarettes a day for 1 year (for example: 1 pack a day for 30 years or 2 packs a day for 15 years). Yearly screening should continue until the smoker has stopped smoking for at least 15 years. Yearly screening should be stopped for people who develop a health problem that would prevent them from having lung cancer treatment.  If you are pregnant, do not drink alcohol. If you  are breastfeeding, be very cautious about drinking alcohol. If you are not pregnant and choose to drink alcohol, do not have more than 1 drink per day. One drink is considered to be 12 ounces (355 mL) of beer, 5 ounces (148 mL) of wine, or 1.5 ounces (44 mL) of liquor.  Avoid use of street drugs. Do not share needles with anyone. Ask for help if you need support or instructions about stopping the use of drugs.  High blood pressure causes heart disease and increases the risk  of stroke. Your blood pressure should be checked at least every 1 to 2 years. Ongoing high blood pressure should be treated with medicines if weight loss and exercise do not work.  If you are 39 61 years old, ask your health care provider if you should take aspirin to prevent strokes.  Diabetes screening involves taking a blood sample to check your fasting blood sugar level. This should be done once every 3 years, after age 56, if you are within normal weight and without risk factors for diabetes. Testing should be considered at a younger age or be carried out more frequently if you are overweight and have at least 1 risk factor for diabetes.  Breast cancer screening is essential preventive care for women. You should practice "breast self-awareness." This means understanding the normal appearance and feel of your breasts and may include breast self-examination. Any changes detected, no matter how small, should be reported to a health care provider. Women in their 40s and 30s should have a clinical breast exam (CBE) by a health care provider as part of a regular health exam every 1 to 3 years. After age 28, women should have a CBE every year. Starting at age 72, women should consider having a mammogram (breast X-ray test) every year. Women who have a family history of breast cancer should talk to their health care provider about genetic screening. Women at a high risk of breast cancer should talk to their health care providers about having an MRI and a mammogram every year.  Breast cancer gene (BRCA)-related cancer risk assessment is recommended for women who have family members with BRCA-related cancers. BRCA-related cancers include breast, ovarian, tubal, and peritoneal cancers. Having family members with these cancers may be associated with an increased risk for harmful changes (mutations) in the breast cancer genes BRCA1 and BRCA2. Results of the assessment will determine the need for genetic counseling  and BRCA1 and BRCA2 testing.  The Pap test is a screening test for cervical cancer. A Pap test can show cell changes on the cervix that might become cervical cancer if left untreated. A Pap test is a procedure in which cells are obtained and examined from the lower end of the uterus (cervix).  Women should have a Pap test starting at age 59.  Between ages 42 and 13, Pap tests should be repeated every 2 years.  Beginning at age 53, you should have a Pap test every 3 years as long as the past 3 Pap tests have been normal.  Some women have medical problems that increase the chance of getting cervical cancer. Talk to your health care provider about these problems. It is especially important to talk to your health care provider if a new problem develops soon after your last Pap test. In these cases, your health care provider may recommend more frequent screening and Pap tests.  The above recommendations are the same for women who have or have not gotten the vaccine  for human papillomavirus (HPV).  If you had a hysterectomy for a problem that was not cancer or a condition that could lead to cancer, then you no longer need Pap tests. Even if you no longer need a Pap test, a regular exam is a good idea to make sure no other problems are starting.  If you are between ages 58 and 10 years, and you have had normal Pap tests going back 10 years, you no longer need Pap tests. Even if you no longer need a Pap test, a regular exam is a good idea to make sure no other problems are starting.  If you have had past treatment for cervical cancer or a condition that could lead to cancer, you need Pap tests and screening for cancer for at least 20 years after your treatment.  If Pap tests have been discontinued, risk factors (such as a new sexual partner) need to be reassessed to determine if screening should be resumed.  The HPV test is an additional test that may be used for cervical cancer screening. The HPV test  looks for the virus that can cause the cell changes on the cervix. The cells collected during the Pap test can be tested for HPV. The HPV test could be used to screen women aged 67 years and older, and should be used in women of any age who have unclear Pap test results. After the age of 65, women should have HPV testing at the same frequency as a Pap test.  Colorectal cancer can be detected and often prevented. Most routine colorectal cancer screening begins at the age of 25 years and continues through age 66 years. However, your health care provider may recommend screening at an earlier age if you have risk factors for colon cancer. On a yearly basis, your health care provider may provide home test kits to check for hidden blood in the stool. Use of a small camera at the end of a tube, to directly examine the colon (sigmoidoscopy or colonoscopy), can detect the earliest forms of colorectal cancer. Talk to your health care provider about this at age 79, when routine screening begins. Direct exam of the colon should be repeated every 5 10 years through age 47 years, unless early forms of pre-cancerous polyps or small growths are found.  People who are at an increased risk for hepatitis B should be screened for this virus. You are considered at high risk for hepatitis B if:  You were born in a country where hepatitis B occurs often. Talk with your health care provider about which countries are considered high risk.  Your parents were born in a high-risk country and you have not received a shot to protect against hepatitis B (hepatitis B vaccine).  You have HIV or AIDS.  You use needles to inject street drugs.  You live with, or have sex with, someone who has Hepatitis B.  You get hemodialysis treatment.  You take certain medicines for conditions like cancer, organ transplantation, and autoimmune conditions.  Hepatitis C blood testing is recommended for all people born from [birth year] through 1965 and  any individual with known risks for hepatitis C.  Practice safe sex. Use condoms and avoid high-risk sexual practices to reduce the spread of sexually transmitted infections (STIs). STIs include gonorrhea, chlamydia, syphilis, trichomonas, herpes, HPV, and human immunodeficiency virus (HIV). Herpes, HIV, and HPV are viral illnesses that have no cure. They can result in disability, cancer, and death. Sexually active women aged 66  years and younger should be checked for chlamydia. Older women with new or multiple partners should also be tested for chlamydia. Testing for other STIs is recommended if you are sexually active and at increased risk.  Osteoporosis is a disease in which the bones lose minerals and strength with aging. This can result in serious bone fractures or breaks. The risk of osteoporosis can be identified using a bone density scan. Women ages 18 years and over and women at risk for fractures or osteoporosis should discuss screening with their health care providers. Ask your health care provider whether you should take a calcium supplement or vitamin D to reduce the rate of osteoporosis.  Menopause can be associated with physical symptoms and risks. Hormone replacement therapy is available to decrease symptoms and risks. You should talk to your health care provider about whether hormone replacement therapy is right for you.  Use sunscreen. Apply sunscreen liberally and repeatedly throughout the day. You should seek shade when your shadow is shorter than you. Protect yourself by wearing long sleeves, pants, a wide-brimmed hat, and sunglasses year round, whenever you are outdoors.  Once a month, do a whole body skin exam, using a mirror to look at the skin on your back. Tell your health care provider of new moles, moles that have irregular borders, moles that are larger than a pencil eraser, or moles that have changed in shape or color.  Stay current with required vaccines  (immunizations).  Influenza vaccine. All adults should be immunized every year.  Tetanus, diphtheria, and acellular pertussis (Td, Tdap) vaccine. Pregnant women should receive 1 dose of Tdap vaccine during each pregnancy. The dose should be obtained regardless of the length of time since the last dose. Immunization is preferred during the 27th 36th week of gestation. An adult who has not previously received Tdap or who does not know her vaccine status should receive 1 dose of Tdap. This initial dose should be followed by tetanus and diphtheria toxoids (Td) booster doses every 10 years. Adults with an unknown or incomplete history of completing a 3-dose immunization series with Td-containing vaccines should begin or complete a primary immunization series including a Tdap dose. Adults should receive a Td booster every 10 years.  Varicella vaccine. An adult without evidence of immunity to varicella should receive 2 doses or a second dose if she has previously received 1 dose. Pregnant females who do not have evidence of immunity should receive the first dose after pregnancy. This first dose should be obtained before leaving the health care facility. The second dose should be obtained 4 8 weeks after the first dose.  Human papillomavirus (HPV) vaccine. Females aged 9 26 years who have not received the vaccine previously should obtain the 3-dose series. The vaccine is not recommended for use in pregnant females. However, pregnancy testing is not needed before receiving a dose. If a female is found to be pregnant after receiving a dose, no treatment is needed. In that case, the remaining doses should be delayed until after the pregnancy. Immunization is recommended for any person with an immunocompromised condition through the age of 51 years if she did not get any or all doses earlier. During the 3-dose series, the second dose should be obtained 4 8 weeks after the first dose. The third dose should be obtained  24 weeks after the first dose and 16 weeks after the second dose.  Zoster vaccine. One dose is recommended for adults aged 57 years or older unless certain  conditions are present.  Measles, mumps, and rubella (MMR) vaccine. Adults born before [birth year] generally are considered immune to measles and mumps. Adults born in [birth year] or later should have 1 or more doses of MMR vaccine unless there is a contraindication to the vaccine or there is laboratory evidence of immunity to each of the three diseases. A routine second dose of MMR vaccine should be obtained at least 28 days after the first dose for students attending postsecondary schools, health care workers, or international travelers. People who received inactivated measles vaccine or an unknown type of measles vaccine during 1963 1967 should receive 2 doses of MMR vaccine. People who received inactivated mumps vaccine or an unknown type of mumps vaccine before 1979 and are at high risk for mumps infection should consider immunization with 2 doses of MMR vaccine. For females of childbearing age, rubella immunity should be determined. If there is no evidence of immunity, females who are not pregnant should be vaccinated. If there is no evidence of immunity, females who are pregnant should delay immunization until after pregnancy. Unvaccinated health care workers born before [birth year] who lack laboratory evidence of measles, mumps, or rubella immunity or laboratory confirmation of disease should consider measles and mumps immunization with 2 doses of MMR vaccine or rubella immunization with 1 dose of MMR vaccine.  Pneumococcal 13-valent conjugate (PCV13) vaccine. When indicated, a person who is uncertain of her immunization history and has no record of immunization should receive the PCV13 vaccine. An adult aged 42 years or older who has certain medical conditions and has not been previously immunized should receive 1 dose of PCV13 vaccine. This PCV13 should be followed  with a dose of pneumococcal polysaccharide (PPSV23) vaccine. The PPSV23 vaccine dose should be obtained at least 8 weeks after the dose of PCV13 vaccine. An adult aged 4 years or older who has certain medical conditions and previously received 1 or more doses of PPSV23 vaccine should receive 1 dose of PCV13. The PCV13 vaccine dose should be obtained 1 or more years after the last PPSV23 vaccine dose.  Pneumococcal polysaccharide (PPSV23) vaccine. When PCV13 is also indicated, PCV13 should be obtained first. All adults aged 27 years and older should be immunized. An adult younger than age 33 years who has certain medical conditions should be immunized. Any person who resides in a nursing home or long-term care facility should be immunized. An adult smoker should be immunized. People with an immunocompromised condition and certain other conditions should receive both PCV13 and PPSV23 vaccines. People with human immunodeficiency virus (HIV) infection should be immunized as soon as possible after diagnosis. Immunization during chemotherapy or radiation therapy should be avoided. Routine use of PPSV23 vaccine is not recommended for American Indians, [place] Natives, or people younger than 65 years unless there are medical conditions that require PPSV23 vaccine. When indicated, people who have unknown immunization and have no record of immunization should receive PPSV23 vaccine. One-time revaccination 5 years after the first dose of PPSV23 is recommended for people aged 13 64 years who have chronic kidney failure, nephrotic syndrome, asplenia, or immunocompromised conditions. People who received 1 2 doses of PPSV23 before age 66 years should receive another dose of PPSV23 vaccine at age 27 years or later if at least 5 years have passed since the previous dose. Doses of PPSV23 are not needed for people immunized with PPSV23 at or after age 33 years.  Meningococcal vaccine. Adults with asplenia or persistent complement  component deficiencies should receive 2  doses of quadrivalent meningococcal conjugate (MenACWY-D) vaccine. The doses should be obtained at least 2 months apart. Microbiologists working with certain meningococcal bacteria, [place] recruits, people at risk during an outbreak, and people who travel to or live in countries with a high rate of meningitis should be immunized. A first-year college student up through age 49 years who is living in a residence hall should receive a dose if she did not receive a dose on or after her 16th birthday. Adults who have certain high-risk conditions should receive one or more doses of vaccine.  Hepatitis A vaccine. Adults who wish to be protected from this disease, have certain high-risk conditions, work with hepatitis A-infected animals, work in hepatitis A research labs, or travel to or work in countries with a high rate of hepatitis A should be immunized. Adults who were previously unvaccinated and who anticipate close contact with an international adoptee during the first 60 days after arrival in the [place] from a country with a high rate of hepatitis A should be immunized.  Hepatitis B vaccine. Adults who wish to be protected from this disease, have certain high-risk conditions, may be exposed to blood or other infectious body fluids, are household contacts or sex partners of hepatitis B positive people, are clients or workers in certain care facilities, or travel to or work in countries with a high rate of hepatitis B should be immunized.  Haemophilus influenzae type b (Hib) vaccine. A previously unvaccinated person with asplenia or sickle cell disease or having a scheduled splenectomy should receive 1 dose of Hib vaccine. Regardless of previous immunization, a recipient of a hematopoietic stem cell transplant should receive a 3-dose series 6 12 months after her successful transplant. Hib vaccine is not recommended for adults with HIV infection. Preventive  Services / Frequency Ages 24 to 39years  Blood pressure check.** / Every 1 to 2 years.  Lipid and cholesterol check.** / Every 5 years beginning at age 66.  Clinical breast exam.** / Every 3 years for women in their 12s and 24s.  BRCA-related cancer risk assessment.** / For women who have family members with a BRCA-related cancer (breast, ovarian, tubal, or peritoneal cancers).  Pap test.** / Every 2 years from ages 31 through 69. Every 3 years starting at age 64 through age 76 or 89 with a history of 3 consecutive normal Pap tests.  HPV screening.** / Every 3 years from ages 10 through ages 10 to 96 with a history of 3 consecutive normal Pap tests.  Hepatitis C blood test.** / For any individual with known risks for hepatitis C.  Skin self-exam. / Monthly.  Influenza vaccine. / Every year.  Tetanus, diphtheria, and acellular pertussis (Tdap, Td) vaccine.** / Consult your health care provider. Pregnant women should receive 1 dose of Tdap vaccine during each pregnancy. 1 dose of Td every 10 years.  Varicella vaccine.** / Consult your health care provider. Pregnant females who do not have evidence of immunity should receive the first dose after pregnancy.  HPV vaccine. / 3 doses over 6 months, if 90 and younger. The vaccine is not recommended for use in pregnant females. However, pregnancy testing is not needed before receiving a dose.  Measles, mumps, rubella (MMR) vaccine.** / You need at least 1 dose of MMR if you were born in 1957 or later. You may also need a 2nd dose. For females of childbearing age, rubella immunity should be determined. If there is no evidence of immunity, females who are not  pregnant should be vaccinated. If there is no evidence of immunity, females who are pregnant should delay immunization until after pregnancy.  Pneumococcal 13-valent conjugate (PCV13) vaccine.** / Consult your health care provider.  Pneumococcal polysaccharide (PPSV23) vaccine.** / 1 to 2  doses if you smoke cigarettes or if you have certain conditions.  Meningococcal vaccine.** / 1 dose if you are age 88 to 6 years and a Market researcher living in a residence hall, or have one of several medical conditions, you need to get vaccinated against meningococcal disease. You may also need additional booster doses.  Hepatitis A vaccine.** / Consult your health care provider.  Hepatitis B vaccine.** / Consult your health care provider.  Haemophilus influenzae type b (Hib) vaccine.** / Consult your health care provider. Ages 23 to 64years  Blood pressure check.** / Every 1 to 2 years.  Lipid and cholesterol check.** / Every 5 years beginning at age 20 years.  Lung cancer screening. / Every year if you are aged 51 80 years and have a 30-pack-year history of smoking and currently smoke or have quit within the past 15 years. Yearly screening is stopped once you have quit smoking for at least 15 years or develop a health problem that would prevent you from having lung cancer treatment.  Clinical breast exam.** / Every year after age 8 years.  BRCA-related cancer risk assessment.** / For women who have family members with a BRCA-related cancer (breast, ovarian, tubal, or peritoneal cancers).  Mammogram.** / Every year beginning at age 10 years and continuing for as long as you are in good health. Consult with your health care provider.  Pap test.** / Every 3 years starting at age 30 years through age 5 or 61 years with a history of 3 consecutive normal Pap tests.  HPV screening.** / Every 3 years from ages 39 years through ages 72 to 19 years with a history of 3 consecutive normal Pap tests.  Fecal occult blood test (FOBT) of stool. / Every year beginning at age 59 years and continuing until age 27 years. You may not need to do this test if you get a colonoscopy every 10 years.  Flexible sigmoidoscopy or colonoscopy.** / Every 5 years for a flexible sigmoidoscopy or every  10 years for a colonoscopy beginning at age 110 years and continuing until age 63 years.  Hepatitis C blood test.** / For all people born from [birth year] through 1963 and any individual with known risks for hepatitis C.  Skin self-exam. / Monthly.  Influenza vaccine. / Every year.  Tetanus, diphtheria, and acellular pertussis (Tdap/Td) vaccine.** / Consult your health care provider. Pregnant women should receive 1 dose of Tdap vaccine during each pregnancy. 1 dose of Td every 10 years.  Varicella vaccine.** / Consult your health care provider. Pregnant females who do not have evidence of immunity should receive the first dose after pregnancy.  Zoster vaccine.** / 1 dose for adults aged 46 years or older.  Measles, mumps, rubella (MMR) vaccine.** / You need at least 1 dose of MMR if you were born in 1957 or later. You may also need a 2nd dose. For females of childbearing age, rubella immunity should be determined. If there is no evidence of immunity, females who are not pregnant should be vaccinated. If there is no evidence of immunity, females who are pregnant should delay immunization until after pregnancy.  Pneumococcal 13-valent conjugate (PCV13) vaccine.** / Consult your health care provider.  Pneumococcal polysaccharide (PPSV23) vaccine.** / 1  to 2 doses if you smoke cigarettes or if you have certain conditions.  Meningococcal vaccine.** / Consult your health care provider.  Hepatitis A vaccine.** / Consult your health care provider.  Hepatitis B vaccine.** / Consult your health care provider.  Haemophilus influenzae type b (Hib) vaccine.** / Consult your health care provider. Ages 57 years and over  Blood pressure check.** / Every 1 to 2 years.  Lipid and cholesterol check.** / Every 5 years beginning at age 53 years.  Lung cancer screening. / Every year if you are aged 55 80 years and have a 30-pack-year history of smoking and currently smoke or have quit within the past 15 years.  Yearly screening is stopped once you have quit smoking for at least 15 years or develop a health problem that would prevent you from having lung cancer treatment.  Clinical breast exam.** / Every year after age 35 years.  BRCA-related cancer risk assessment.** / For women who have family members with a BRCA-related cancer (breast, ovarian, tubal, or peritoneal cancers).  Mammogram.** / Every year beginning at age 26 years and continuing for as long as you are in good health. Consult with your health care provider.  Pap test.** / Every 3 years starting at age 62 years through age 80 or 74 years with 3 consecutive normal Pap tests. Testing can be stopped between 65 and 70 years with 3 consecutive normal Pap tests and no abnormal Pap or HPV tests in the past 10 years.  HPV screening.** / Every 3 years from ages 64 years through ages 79 or 110 years with a history of 3 consecutive normal Pap tests. Testing can be stopped between 65 and 70 years with 3 consecutive normal Pap tests and no abnormal Pap or HPV tests in the past 10 years.  Fecal occult blood test (FOBT) of stool. / Every year beginning at age 46 years and continuing until age 57 years. You may not need to do this test if you get a colonoscopy every 10 years.  Flexible sigmoidoscopy or colonoscopy.** / Every 5 years for a flexible sigmoidoscopy or every 10 years for a colonoscopy beginning at age 85 years and continuing until age 9 years.  Hepatitis C blood test.** / For all people born from [birth year] through 1963 and any individual with known risks for hepatitis C.  Osteoporosis screening.** / A one-time screening for women ages 75 years and over and women at risk for fractures or osteoporosis.  Skin self-exam. / Monthly.  Influenza vaccine. / Every year.  Tetanus, diphtheria, and acellular pertussis (Tdap/Td) vaccine.** / 1 dose of Td every 10 years.  Varicella vaccine.** / Consult your health care provider.  Zoster vaccine.** / 1  dose for adults aged 66 years or older.  Pneumococcal 13-valent conjugate (PCV13) vaccine.** / Consult your health care provider.  Pneumococcal polysaccharide (PPSV23) vaccine.** / 1 dose for all adults aged 51 years and older.  Meningococcal vaccine.** / Consult your health care provider.  Hepatitis A vaccine.** / Consult your health care provider.  Hepatitis B vaccine.** / Consult your health care provider.  Haemophilus influenzae type b (Hib) vaccine.** / Consult your health care provider. ** Family history and personal history of risk and conditions may change your health care provider's recommendations. Document Released: 06/28/2001 Document Revised: 02/20/2013 Document Reviewed: 09/27/2010 [facility] Patient Information 2014 [place], [place]. Hypertension As your heart beats, it forces blood through your arteries. This force is your blood pressure. If the pressure is too high, it is  called hypertension (HTN) or high blood pressure. HTN is dangerous because you may have it and not know it. High blood pressure may mean that your heart has to work harder to pump blood. Your arteries may be narrow or stiff. The extra work puts you at risk for heart disease, stroke, and other problems.  Blood pressure consists of two numbers, a higher number over a lower, 110/72, for example. It is stated as "110 over 72." The ideal is below 120 for the top number (systolic) and under 80 for the bottom (diastolic). Write down your blood pressure today. You should pay close attention to your blood pressure if you have certain conditions such as:  Heart failure.  Prior heart attack.  Diabetes  Chronic kidney disease.  Prior stroke.  Multiple risk factors for heart disease. To see if you have HTN, your blood pressure should be measured while you are seated with your arm held at the level of the heart. It should be measured at least twice. A one-time elevated blood pressure reading (especially in the  Emergency Department) does not mean that you need treatment. There may be conditions in which the blood pressure is different between your right and left arms. It is important to see your caregiver soon for a recheck. Most people have essential hypertension which means that there is not a specific cause. This type of high blood pressure may be lowered by changing lifestyle factors such as:  Stress.  Smoking.  Lack of exercise.  Excessive weight.  Drug/tobacco/alcohol use.  Eating less salt. Most people do not have symptoms from high blood pressure until it has caused damage to the body. Effective treatment can often prevent, delay or reduce that damage. TREATMENT  When a cause has been identified, treatment for high blood pressure is directed at the cause. There are a large number of medications to treat HTN. These fall into several categories, and your caregiver will help you select the medicines that are best for you. Medications may have side effects. You should review side effects with your caregiver. If your blood pressure stays high after you have made lifestyle changes or started on medicines,   Your medication(s) may need to be changed.  Other problems may need to be addressed.  Be certain you understand your prescriptions, and know how and when to take your medicine.  Be sure to follow up with your caregiver within the time frame advised (usually within two weeks) to have your blood pressure rechecked and to review your medications.  If you are taking more than one medicine to lower your blood pressure, make sure you know how and at what times they should be taken. Taking two medicines at the same time can result in blood pressure that is too low. SEEK IMMEDIATE MEDICAL CARE IF:  You develop a severe headache, blurred or changing vision, or confusion.  You have unusual weakness or numbness, or a faint feeling.  You have severe chest or abdominal pain, vomiting, or breathing  problems. MAKE SURE YOU:   Understand these instructions.  Will watch your condition.  Will get help right away if you are not doing well or get worse. Document Released: 05/02/2005 Document Revised: 07/25/2011 Document Reviewed: 12/21/2007 [facility] Patient Information 2014 [place].

## 2013-09-19 NOTE — Assessment & Plan Note (Signed)
I have asked her to see sleep med for further evaluation 

## 2013-09-19 NOTE — Assessment & Plan Note (Signed)
I will check her A1C to see if she has developed DM2 

## 2013-09-19 NOTE — Assessment & Plan Note (Signed)
Exam done Vaccines were reviewed Labs ordered Pt ed material was given 

## 2013-09-19 NOTE — Assessment & Plan Note (Signed)
Her BP is well controlled Will check her lytes and renal function today

## 2013-09-19 NOTE — Assessment & Plan Note (Signed)
She is due for a DEXA scan 

## 2013-10-19 ENCOUNTER — Ambulatory Visit (INDEPENDENT_AMBULATORY_CARE_PROVIDER_SITE_OTHER): Payer: BC Managed Care – PPO | Admitting: Physician Assistant

## 2013-10-19 VITALS — BP 100/66 | HR 66 | Temp 97.8°F | Resp 16 | Ht 66.69 in | Wt 133.0 lb

## 2013-10-19 DIAGNOSIS — L0390 Cellulitis, unspecified: Secondary | ICD-10-CM

## 2013-10-19 DIAGNOSIS — L0291 Cutaneous abscess, unspecified: Secondary | ICD-10-CM

## 2013-10-19 DIAGNOSIS — L299 Pruritus, unspecified: Secondary | ICD-10-CM

## 2013-10-19 MED ORDER — DOXYCYCLINE HYCLATE 100 MG PO CAPS: 100.0000 mg | ORAL_CAPSULE | Freq: Two times a day (BID) | ORAL | Status: DC

## 2013-10-19 NOTE — Progress Notes (Signed)
Subjective:    Patient ID: [name], female    DOB: 1950-10-11, 63 y.o.   MRN: [id number]  HPI Primary Physician: Scarlette Calico, MD  Chief Complaint:   HPI: 63 y.o. female with history below presents with 1 day history of pruritic rash along the medial aspect of the left knee. Patient was outside doing some gardening prior to the development of the rash. Did not see a bug bite her. Swelling or erythema is the same from the prior night. No drainage or discharge. Full range of motion. No difficulty with ambulation. No injury or trauma.    Past Medical History  Diagnosis Date  . Rectal bleeding   . Hypertension   . Osteopenia   . Cancer     basal cell, face     Home Meds: Prior to Admission medications   Medication Sig Start Date End Date Taking? Authorizing Provider  calcium carbonate (TUMS - DOSED IN MG ELEMENTAL CALCIUM) 500 MG chewable tablet Chew 1 tablet by mouth as needed.    Yes Historical Provider, MD  Cholecalciferol (VITAMIN D3) 2000 UNITS TABS Take by mouth every morning.     Yes Historical Provider, MD  losartan-hydrochlorothiazide (HYZAAR) 100-12.5 MG per tablet TAKE 1 TABLET ONCE DAILY. 10/09/12  Yes Janith Lima, MD  Multiple Vitamin (MULTIVITAMIN) tablet Take 1 tablet by mouth daily.     Yes Historical Provider, MD  Probiotic Product (PROBIOTIC PO) Take by mouth daily.    Yes Historical Provider, MD  triamcinolone cream (KENALOG) 0.5 % Apply 1 application topically 3 (three) times daily. 07/04/13  No Aleksei Plotnikov V, MD    Allergies: No Known Allergies  History   Social History  . Marital Status: Unknown    Spouse Name: N/A    Number of Children: 3  . Years of Education: N/A   Occupational History  .     Social History Main Topics  . Smoking status: Former Smoker -- 0.50 packs/day for 5 years    Types: Cigarettes    Quit date: 12/02/1974  . Smokeless tobacco: Never Used  . Alcohol Use: 4.2 oz/week    7 Glasses of wine per week   Comment: occ glass of wine, drink of choice beer  . Drug Use: No  . Sexual Activity: Yes    Partners: Female    Birth Control/ Protection: Post-menopausal   Other Topics Concern  . Not on file   Social History Narrative   Daily caffeine use     Review of Systems  Constitutional: Negative for fever and chills.  Eyes: Negative for photophobia and visual disturbance.  Gastrointestinal: Negative for nausea, vomiting and diarrhea.  Skin: Positive for color change, rash and wound. Negative for pallor.       Erythema medial aspect of the left knee.   Neurological: Negative for headaches.       Objective:   Physical Exam  Physical Exam: Blood pressure 100/66, pulse 66, temperature 97.8 F (36.6 C), temperature source Oral, resp. rate 16, height 5' 6.69" (1.694 m), weight 133 lb (60.328 kg), last menstrual period 05/16/2006, SpO2 100.00%., Body mass index is 21.02 kg/(m^2). General: Well developed, well nourished, in no acute distress. Head: Normocephalic, atraumatic, eyes without discharge, sclera non-icteric, nares are without discharge.    Neck: Supple. Full ROM.  Lungs: Breathing is unlabored. Heart: Regular rate. Msk:  Strength and tone normal for age. Extremities/Skin: Warm and dry. No clubbing or cyanosis. No edema. Left knee: 4 cm x 4  cm mildly erythematous warm slightly TTP along the medial aspect just below the inferior joint line. No induration or fluctuance.  Neuro: Alert and oriented X 3. Moves all extremities spontaneously. Gait is normal. CNII-XII grossly in tact. Psych:  Responds to questions appropriately with a normal affect.        Assessment & Plan:  63 year old female with cellulitis of the medial aspect of the left knee -Doxycycline 100 mg 1 po bid #20 no RF -Warm compresses -Elevation -Rest -RTC precautions   Christell Faith, MHS, PA-C [facility] [place], [place] [address] [place] 10/19/2013 10:52 AM

## 2013-10-21 ENCOUNTER — Institutional Professional Consult (permissible substitution): Payer: Self-pay | Admitting: Pulmonary Disease

## 2013-11-25 ENCOUNTER — Telehealth: Payer: Self-pay | Admitting: Obstetrics and Gynecology

## 2013-11-25 NOTE — Telephone Encounter (Signed)
Need to reschedule pts appt on 12/02/13 at 7:00 am.

## 2013-11-26 ENCOUNTER — Telehealth: Payer: Self-pay | Admitting: Internal Medicine

## 2013-11-26 ENCOUNTER — Other Ambulatory Visit: Payer: Self-pay | Admitting: Internal Medicine

## 2013-11-26 DIAGNOSIS — I10 Essential (primary) hypertension: Secondary | ICD-10-CM

## 2013-11-26 MED ORDER — LOSARTAN POTASSIUM-HCTZ 100-12.5 MG PO TABS: ORAL_TABLET | ORAL | Status: DC

## 2013-11-26 NOTE — Telephone Encounter (Signed)
Pt request refill for losartan. Pt stated last time she was here Dr. Ronnald Ramp forgot to give her new Rx for this med, she normally get a year supply. Please advise. [facility].

## 2013-12-02 ENCOUNTER — Ambulatory Visit: Payer: BC Managed Care – PPO | Admitting: Obstetrics and Gynecology

## 2014-01-01 ENCOUNTER — Encounter: Payer: Self-pay | Admitting: Obstetrics and Gynecology

## 2014-01-01 ENCOUNTER — Ambulatory Visit (INDEPENDENT_AMBULATORY_CARE_PROVIDER_SITE_OTHER): Payer: BC Managed Care – PPO | Admitting: Obstetrics and Gynecology

## 2014-01-01 VITALS — BP 110/72 | HR 64 | Resp 18 | Ht 66.5 in | Wt 132.8 lb

## 2014-01-01 DIAGNOSIS — Z803 Family history of malignant neoplasm of breast: Secondary | ICD-10-CM | POA: Insufficient documentation

## 2014-01-01 DIAGNOSIS — Z01419 Encounter for gynecological examination (general) (routine) without abnormal findings: Secondary | ICD-10-CM

## 2014-01-01 DIAGNOSIS — Z0000 Encounter for general adult medical examination without abnormal findings: Secondary | ICD-10-CM

## 2014-01-01 DIAGNOSIS — Z01812 Encounter for preprocedural laboratory examination: Secondary | ICD-10-CM

## 2014-01-01 DIAGNOSIS — Z1239 Encounter for other screening for malignant neoplasm of breast: Secondary | ICD-10-CM

## 2014-01-01 LAB — POCT URINALYSIS DIPSTICK
BILIRUBIN UA: NEGATIVE
Glucose, UA: NEGATIVE
Ketones, UA: NEGATIVE
Leukocytes, UA: NEGATIVE
NITRITE UA: NEGATIVE
Protein, UA: NEGATIVE
RBC UA: NEGATIVE
Urobilinogen, UA: NEGATIVE
pH, UA: 5

## 2014-01-01 NOTE — Progress Notes (Signed)
Patient ID: Diane Day, female   DOB: [birth year], 63 y.o.   MRN: [id number] GYNECOLOGY VISIT  PCP:  Scarlette Calico, MD  Referring provider:   HPI: 63 y.o.   Unknown  Caucasian  female   [id number] with [name] was 05/16/2006.   here for   AEX.  Family history of breast cancer - mother, sister, sister. Sister did genetic testing - Negative for BRCA. Patient has had genetic counseling but did not do testing.  Had breast MRI in 2013 which was negative.   Hgb:    PCP Urine:  Neg  GYNECOLOGIC HISTORY: [name] was 05/16/2006. Sexually active: yes Partner preference: female Contraception:   postmenopausal Menopausal hormone therapy: none DES exposure:   no Blood transfusions: no   Sexually transmitted diseases:  no  GYN procedures and prior surgeries:  Tubal ligation, 1998 fibroadenoma removed from left breast. Last mammogram: 09-10-12 [facility]                 Last pap and high risk HPV testing: 11-30-12 ASCUS:neg HR HPV.   History of abnormal pap smear:  no   OB History   Grav Para Term Preterm Abortions TAB SAB Ect Mult Living   3 3 3       3        LIFESTYLE: Exercise:  Walking and swimming            OTHER HEALTH MAINTENANCE: Tetanus/TDap:  Up to date with PCP HPV:                  n/a Influenza:            never   Bone density:     10/2007 Osteopenia: [facility]  Colonoscopy:     2014 normal with Dr. Delfin Edis.  Next colonoscopy due 2024.  Cholesterol check: 09-19-13 Total:189, HDL:85, LDL 94  Family History  Problem Relation Age of Onset  . Diabetes Father   . Heart disease Father   . Hypertension Father   . Esophageal cancer Father 81  . Breast cancer Mother 7  . Hypertension Mother   . Osteoporosis Mother   . Breast cancer Sister 56    BRCA negative  . Colon cancer Neg Hx   . Stomach cancer Neg Hx   . Rectal cancer Neg Hx   . Breast cancer Sister 43  . Cancer Paternal Aunt     unknown form of cancer  over the age of 15  . Thyroid cancer Cousin     maternal first cousin  . Breast cancer Cousin     died in her 37s; paternal cousin  . Breast cancer Cousin     paternal first cousin    Patient Active Problem List   Diagnosis Date Noted  . Snoring 09/19/2013  . Routine general medical examination at a health care facility 09/17/2012  . Osteopenia 09/17/2012  . Other abnormal glucose 12/02/2011  . Pure hypercholesterolemia 12/02/2011  . Back pain, thoracic 11/03/2011  . HYPERTENSION 09/19/2008   Past Medical History  Diagnosis Date  . Rectal bleeding   . Hypertension   . Osteopenia   . Cancer     basal cell, face    Past Surgical History  Procedure Laterality Date  . Repair broken arm  2008  . Tubal ligation  1994  . Breast surgery Left 1998    remove fibroadenoma  . Knee arthroscopy Left     ALLERGIES: Review of patient's  allergies indicates no known allergies.  Current Outpatient Prescriptions  Medication Sig Dispense Refill  . calcium carbonate (TUMS - DOSED IN MG ELEMENTAL CALCIUM) 500 MG chewable tablet Chew 1 tablet by mouth as needed.       . Cholecalciferol (VITAMIN D3) 2000 UNITS TABS Take by mouth every morning.        Marland Kitchen losartan-hydrochlorothiazide (HYZAAR) 100-12.5 MG per tablet TAKE 1 TABLET ONCE DAILY.  30 tablet  11  . Multiple Vitamin (MULTIVITAMIN) tablet Take 1 tablet by mouth daily.        . Probiotic Product (PROBIOTIC PO) Take by mouth daily.        No current facility-administered medications for this visit.     ROS:  Pertinent items are noted in HPI.  History   Social History  . Marital Status: Unknown    Spouse Name: N/A    Number of Children: 3  . Years of Education: N/A   Occupational History  .     Social History Main Topics  . Smoking status: Former Smoker -- 0.50 packs/day for 5 years    Types: Cigarettes    Quit date: 12/02/1974  . Smokeless tobacco: Never Used  . Alcohol Use: 4.2 oz/week    7 Glasses of wine per week      Comment: occ glass of wine, drink of choice beer  . Drug Use: No  . Sexual Activity: Yes    Partners: Male    Birth Control/ Protection: Post-menopausal   Other Topics Concern  . Not on file   Social History Narrative   Daily caffeine use    PHYSICAL EXAMINATION:    BP 110/72  Pulse 64  Resp 18  Ht 5' 6.5" (1.689 m)  Wt 132 lb 12.8 oz (60.238 kg)  BMI 21.12 kg/m2  LMP 05/16/2006   Wt Readings from Last 3 Encounters:  01/01/14 132 lb 12.8 oz (60.238 kg)  10/19/13 133 lb (60.328 kg)  09/19/13 132 lb (59.875 kg)     Ht Readings from Last 3 Encounters:  01/01/14 5' 6.5" (1.689 m)  10/19/13 5' 6.69" (1.694 m)  09/19/13 5' 7"  (1.702 m)    General appearance: alert, cooperative and appears stated age Head: Normocephalic, without obvious abnormality, atraumatic Neck: no adenopathy, supple, symmetrical, trachea midline and thyroid not enlarged, symmetric, no tenderness/mass/nodules Lungs: clear to auscultation bilaterally Breasts: Inspection negative on right, scar on left breast, No nipple retraction or dimpling, No nipple discharge or bleeding, No axillary or supraclavicular adenopathy, Normal to palpation without dominant masses Heart: regular rate and rhythm Abdomen: soft, non-tender; no masses,  no organomegaly Extremities: extremities normal, atraumatic, no cyanosis or edema Skin: Skin color, texture, turgor normal. No rashes or lesions Lymph nodes: Cervical, supraclavicular, and axillary nodes normal. No abnormal inguinal nodes palpated Neurologic: Grossly normal  Pelvic: External genitalia:  no lesions              Urethra:  normal appearing urethra with no masses, tenderness or lesions              Bartholins and Skenes: normal                 Vagina: normal appearing vagina with normal color and discharge, no lesions              Cervix: normal appearance              Pap and high risk HPV testing done: No.  Bimanual Exam:  Uterus:  uterus is normal size,  shape, consistency and nontender                                      Adnexa: normal adnexa in size, nontender and no masses                                      Rectovaginal:  Yes.                                        Confirms above.                                      Anus:  normal sphincter tone, no lesions  ASSESSMENT  Normal gynecologic exam. Family history of breast cancer.  Osteopenia.   PLAN  Mammogram recommended yearly starting at age 34.  Patient will do MRI of breasts also this year.  Pap smear and high risk HPV testing as above. Counseled on self breast exam, Calcium and vitamin D intake, exercise. See lab orders: CMET. Bond density next year. Return annually or prn   An After Visit Summary was printed and given to the patient.

## 2014-01-01 NOTE — Progress Notes (Signed)
Patient scheduled for 3D screening mammogram at [place] appointment for mammogram on 01/14/14 at 0945. Patient given written appointment information.  Will start process for prior authorization of bilateral breast MRI with and wo IV contrast.  Patient with hx of clautrophobia-will need open MRI.  No kidney disease, liver disease or diabetes.  Hx HBP, will need CMET within 30 days of MRI. Patient aware and ordered. Will set up lab appointment when appointment date for MRI is known.  Patient denies hx of internal devices or pacemakers.  Patient requests early Tuesday mornings for appointments.

## 2014-01-01 NOTE — Patient Instructions (Signed)

## 2014-01-07 ENCOUNTER — Telehealth: Payer: Self-pay | Admitting: Emergency Medicine

## 2014-01-07 NOTE — Telephone Encounter (Signed)
Spoke with Nurse reviewer at [facility] for [facility].  Received authorization for bilateral breast MRI with and wo IV contrast.  Authorization # [id number] valid for 30 days from today.   Breast Cancer Lifetime risk assessment is 30.6% as calculated by nurse reviewer utilizing the Breast Cancer Risk Assessment tool.

## 2014-01-09 NOTE — Telephone Encounter (Signed)
Message left to return call to Garion Wempe at [phone].    

## 2014-01-09 NOTE — Telephone Encounter (Signed)
Patient returned call. She cannot keep the appointment that was scheduled for her. She will be out of town 9/16-9/22/15. Patient states that her schedule fluctuates. I requested that patient call [facility] and reschedule her appointment and call me back with date of appointment. She is agreeable to this.  Needs lab work prior to MRI.

## 2014-01-09 NOTE — Telephone Encounter (Signed)
Patient is scheduled for bilateral breast MRI on 02/04/14 with arrival time at 1015.   [facility] [place], [place] [address] phone [phone].   Will need labs drawn prior. Can have lab visit here, BMP is ordered. Hx HBP on medications.  Confirmed with Areena at [place], patient is scheduled to have open MRI due to hx of claustrophobia.   Authorization good for 30 days only starting 01/07/14. Will need to re-authorize MRI is cannot have MRI done prior to 02/06/14.  Patient is scheduled for screening mammogram at [facility] on 01/14/14 at 0945.   Message left to return call to [facility] at [phone] to patient to advise and set up lab appointment time.

## 2014-01-09 NOTE — Telephone Encounter (Signed)
Return call to Tracy. °

## 2014-01-10 NOTE — Telephone Encounter (Signed)
Patient has r/s MRI to 01/22/14 at [place].  Called patient and scheduled appointment for lab appointment for BMET prior to MRI.  Patient given appointment for 01/14/14 at 1600. She is agreeable to this.  Will hold message to ensure MRI appointment completed.

## 2014-01-10 NOTE — Telephone Encounter (Signed)
Thank you for the update!

## 2014-01-14 ENCOUNTER — Other Ambulatory Visit (INDEPENDENT_AMBULATORY_CARE_PROVIDER_SITE_OTHER): Payer: BC Managed Care – PPO

## 2014-01-14 DIAGNOSIS — Z01812 Encounter for preprocedural laboratory examination: Secondary | ICD-10-CM

## 2014-01-15 LAB — BASIC METABOLIC PANEL
BUN: 14 mg/dL (ref 6–23)
CHLORIDE: 103 meq/L (ref 96–112)
CO2: 29 mEq/L (ref 19–32)
Calcium: 9.7 mg/dL (ref 8.4–10.5)
Creat: 0.79 mg/dL (ref 0.50–1.10)
GLUCOSE: 109 mg/dL — AB (ref 70–99)
POTASSIUM: 3.6 meq/L (ref 3.5–5.3)
SODIUM: 142 meq/L (ref 135–145)

## 2014-01-22 ENCOUNTER — Ambulatory Visit
Admission: RE | Admit: 2014-01-22 | Discharge: 2014-01-22 | Disposition: A | Discharge status: Home / Self Care | Payer: BC Managed Care – PPO | Source: Ambulatory Visit | Attending: Obstetrics and Gynecology | Admitting: Obstetrics and Gynecology

## 2014-01-22 DIAGNOSIS — Z803 Family history of malignant neoplasm of breast: Secondary | ICD-10-CM

## 2014-01-22 MED ORDER — GADOBENATE DIMEGLUMINE 529 MG/ML IV SOLN: 12.0000 mL | Freq: Once | INTRAVENOUS | Status: AC | PRN

## 2014-01-24 ENCOUNTER — Telehealth: Payer: Self-pay | Admitting: Emergency Medicine

## 2014-01-24 NOTE — Telephone Encounter (Signed)
Message copied by Michele Mcalpine on Fri Jan 24, 2014 10:12 AM ------      Message from: Gar[place], [place]: Thu Jan 23, 2014 10:09 PM       Please share normal breast MRI results with patient.       Radiologist is recommending next screening mammogram due in September 2016. ------

## 2014-01-24 NOTE — Telephone Encounter (Signed)
Patient notified of normal results of MRI.  Screening mammogram 01/2015 and verbalized understanding.  Routing to provider for final review. Patient agreeable to disposition. Will close encounter

## 2014-02-04 ENCOUNTER — Other Ambulatory Visit: Payer: Self-pay

## 2014-03-17 ENCOUNTER — Encounter: Payer: Self-pay | Admitting: Obstetrics and Gynecology

## 2014-11-20 ENCOUNTER — Encounter: Payer: Self-pay | Admitting: Internal Medicine

## 2014-11-20 ENCOUNTER — Other Ambulatory Visit (INDEPENDENT_AMBULATORY_CARE_PROVIDER_SITE_OTHER): Payer: BLUE CROSS/BLUE SHIELD

## 2014-11-20 ENCOUNTER — Ambulatory Visit (INDEPENDENT_AMBULATORY_CARE_PROVIDER_SITE_OTHER): Payer: BLUE CROSS/BLUE SHIELD | Admitting: Internal Medicine

## 2014-11-20 VITALS — BP 120/82 | HR 74 | Temp 97.8°F | Resp 16 | Ht 66.5 in | Wt 128.0 lb

## 2014-11-20 DIAGNOSIS — R739 Hyperglycemia, unspecified: Secondary | ICD-10-CM

## 2014-11-20 DIAGNOSIS — E7800 Pure hypercholesterolemia, unspecified: Secondary | ICD-10-CM

## 2014-11-20 DIAGNOSIS — M8580 Other specified disorders of bone density and structure, unspecified site: Secondary | ICD-10-CM | POA: Diagnosis not present

## 2014-11-20 DIAGNOSIS — Z0000 Encounter for general adult medical examination without abnormal findings: Secondary | ICD-10-CM | POA: Diagnosis not present

## 2014-11-20 DIAGNOSIS — I10 Essential (primary) hypertension: Secondary | ICD-10-CM | POA: Diagnosis not present

## 2014-11-20 LAB — COMPREHENSIVE METABOLIC PANEL
ALBUMIN: 4.3 g/dL (ref 3.5–5.2)
ALT: 27 U/L (ref 0–35)
AST: 20 U/L (ref 0–37)
Alkaline Phosphatase: 77 U/L (ref 39–117)
BUN: 15 mg/dL (ref 6–23)
CALCIUM: 9.5 mg/dL (ref 8.4–10.5)
CO2: 29 mEq/L (ref 19–32)
Chloride: 103 mEq/L (ref 96–112)
Creatinine, Ser: 0.83 mg/dL (ref 0.40–1.20)
GFR: 74.14 mL/min (ref >60.00)
Glucose, Bld: 92 mg/dL (ref 70–99)
POTASSIUM: 4 meq/L (ref 3.5–5.1)
SODIUM: 139 meq/L (ref 135–145)
TOTAL PROTEIN: 6.6 g/dL (ref 6.0–8.3)
Total Bilirubin: 0.7 mg/dL (ref 0.2–1.2)

## 2014-11-20 LAB — HEMOGLOBIN A1C: Hgb A1c MFr Bld: 5.6 % (ref 4.6–6.5)

## 2014-11-20 LAB — LIPID PANEL
CHOL/HDL RATIO: 3
CHOLESTEROL: 200 mg/dL (ref 0–200)
HDL: 77.8 mg/dL (ref >39.00)
LDL CALC: 114 mg/dL — AB (ref 0–99)
NonHDL: 122.2
Triglycerides: 40 mg/dL (ref 0.0–149.0)
VLDL: 8 mg/dL (ref 0.0–40.0)

## 2014-11-20 LAB — TSH: TSH: 0.78 u[IU]/mL (ref 0.35–4.50)

## 2014-11-20 LAB — CBC WITH DIFFERENTIAL/PLATELET
BASOS ABS: 0.1 10*3/uL (ref 0.0–0.1)
Basophils Relative: 0.7 % (ref 0.0–3.0)
EOS ABS: 0.1 10*3/uL (ref 0.0–0.7)
Eosinophils Relative: 1 % (ref 0.0–5.0)
HEMATOCRIT: 42.6 % (ref 36.0–46.0)
Hemoglobin: 14.2 g/dL (ref 12.0–15.0)
LYMPHS ABS: 2.5 10*3/uL (ref 0.7–4.0)
Lymphocytes Relative: 35.3 % (ref 12.0–46.0)
MCHC: 33.4 g/dL (ref 30.0–36.0)
MCV: 89.2 fl (ref 78.0–100.0)
Monocytes Absolute: 0.6 10*3/uL (ref 0.1–1.0)
Monocytes Relative: 8.1 % (ref 3.0–12.0)
Neutro Abs: 3.9 10*3/uL (ref 1.4–7.7)
Neutrophils Relative %: 54.9 % (ref 43.0–77.0)
PLATELETS: 263 10*3/uL (ref 150.0–400.0)
RBC: 4.78 Mil/uL (ref 3.87–5.11)
RDW: 13.3 % (ref 11.5–15.5)
WBC: 7 10*3/uL (ref 4.0–10.5)

## 2014-11-20 NOTE — Progress Notes (Signed)
Pre visit review using our clinic review tool, if applicable. No additional management support is needed unless otherwise documented below in the visit note. 

## 2014-11-20 NOTE — Assessment & Plan Note (Signed)
Exam done PAP and mammo are UTD Vaccines were reviewed Labs ordered

## 2014-11-20 NOTE — Progress Notes (Signed)
Subjective:  Patient ID: Diane Day, female    DOB: June 22, 1950  Age: 64 y.o. MRN: [id number]  CC: Hypertension and Annual Exam   HPI TIEARRA COLWELL presents for a CPX and a BP check - she has had some dizzy/lightheaded spells and has decided to stop her BP meds for a few weeks to see if she still needs them, since being diagnosed with HTN she has lost weight and exercises a lot more.   Outpatient Prescriptions Prior to Visit  Medication Sig Dispense Refill  . calcium carbonate (TUMS - DOSED IN MG ELEMENTAL CALCIUM) 500 MG chewable tablet Chew 1 tablet by mouth as needed.     . Cholecalciferol (VITAMIN D3) 2000 UNITS TABS Take by mouth every morning.      . Multiple Vitamin (MULTIVITAMIN) tablet Take 1 tablet by mouth daily.      . Probiotic Product (PROBIOTIC PO) Take by mouth daily.     Marland Kitchen losartan-hydrochlorothiazide (HYZAAR) 100-12.5 MG per tablet TAKE 1 TABLET ONCE DAILY. 30 tablet 11   No facility-administered medications prior to visit.    ROS Review of Systems  Constitutional: Negative.  Negative for fever, chills, diaphoresis, appetite change, fatigue and unexpected weight change.  HENT: Negative.  Negative for congestion, trouble swallowing and voice change.   Eyes: Negative.  Negative for visual disturbance.  Respiratory: Negative.  Negative for cough, choking, chest tightness, shortness of breath and stridor.   Cardiovascular: Negative.   Gastrointestinal: Negative.  Negative for nausea, vomiting, abdominal pain, diarrhea and constipation.  Endocrine: Negative.   Genitourinary: Negative.  Negative for difficulty urinating.  Musculoskeletal: Negative.   Skin: Negative.   Allergic/Immunologic: Negative.   Neurological: Positive for dizziness and light-headedness. Negative for tremors, seizures, syncope, facial asymmetry, speech difficulty, weakness, numbness and headaches.  Hematological: Negative.  Negative for adenopathy. Does not bruise/bleed easily.    Psychiatric/Behavioral: Negative.     Objective:  BP 120/82 mmHg  Pulse 74  Temp(Src) 97.8 F (36.6 C) (Oral)  Ht 5' 6.5" (1.689 m)  Wt 128 lb (58.06 kg)  BMI 20.35 kg/m2  SpO2 98%  LMP 05/16/2006  BP Readings from Last 3 Encounters:  11/20/14 120/82  01/01/14 110/72  10/19/13 100/66    Wt Readings from Last 3 Encounters:  11/20/14 128 lb (58.06 kg)  01/01/14 132 lb 12.8 oz (60.238 kg)  10/19/13 133 lb (60.328 kg)    Physical Exam  Constitutional: She is oriented to person, place, and time. She appears well-developed and well-nourished. No distress.  HENT:  Head: Normocephalic and atraumatic.  Mouth/Throat: Oropharynx is clear and moist. No oropharyngeal exudate.  Eyes: Conjunctivae are normal. Right eye exhibits no discharge. Left eye exhibits no discharge. No scleral icterus.  Neck: Normal range of motion. Neck supple. No JVD present. No tracheal deviation present. No thyromegaly present.  Cardiovascular: Normal rate, regular rhythm, normal heart sounds and intact distal pulses.  Exam reveals no gallop and no friction rub.   No murmur heard. Pulmonary/Chest: Effort normal and breath sounds normal. No stridor. No respiratory distress. She has no wheezes. She has no rales. She exhibits no tenderness.  Abdominal: Soft. Bowel sounds are normal. She exhibits no distension and no mass. There is no tenderness. There is no rebound and no guarding.  Musculoskeletal: Normal range of motion. She exhibits no edema or tenderness.  Lymphadenopathy:    She has no cervical adenopathy.  Neurological: She is oriented to person, place, and time.  Skin: Skin is warm and dry.  No rash noted. She is not diaphoretic. No erythema. No pallor.  Psychiatric: She has a normal mood and affect. Her behavior is normal. Judgment and thought content normal.  Vitals reviewed.   Lab Results  Component Value Date   WBC 5.1 09/19/2013   HGB 14.7 09/19/2013   HCT 43.0 09/19/2013   PLT 234.0  09/19/2013   GLUCOSE 109* 01/14/2014   CHOL 189 09/19/2013   TRIG 51.0 09/19/2013   HDL 85.10 09/19/2013   LDLCALC 94 09/19/2013   ALT 21 09/19/2013   AST 23 09/19/2013   NA 142 01/14/2014   K 3.6 01/14/2014   CL 103 01/14/2014   CREATININE 0.79 01/14/2014   BUN 14 01/14/2014   CO2 29 01/14/2014   TSH 0.82 09/19/2013   HGBA1C 5.7 09/19/2013    Mr Breast Bilateral W Wo Contrast  01/22/2014   CLINICAL DATA:  64 year old female with a dense fibroglandular pattern in strong family history of breast cancer. The patient's mother was diagnosed with breast cancer at the age of 5. Two sisters were diagnosed with breast cancer at ages 75 and 33.  LABS:  None obtained at the time of exam.  EXAM: BILATERAL BREAST MRI WITH AND WITHOUT CONTRAST  TECHNIQUE: Multiplanar, multisequence MR images of both breasts were obtained prior to and following the intravenous administration of 42ml of MultiHance.  THREE-DIMENSIONAL MR IMAGE RENDERING ON INDEPENDENT WORKSTATION:  Three-dimensional MR images were rendered by post-processing of the original MR data on an independent workstation. The three-dimensional MR images were interpreted, and findings are reported in the following complete MRI report for this study. Three dimensional images were evaluated at the independent DynaCad workstation  COMPARISON:  Screening mammograms from [facility] dated 01/16/2014, 09/10/2012, 09/09/2011 and 09/08/2010.  FINDINGS: Breast composition: c:  Heterogeneous fibroglandular tissue  Background parenchymal enhancement: Moderate  Right breast: No mass or abnormal enhancement.  Left breast: No mass or abnormal enhancement.  Lymph nodes: No abnormal appearing lymph nodes.  Ancillary findings:  None.  IMPRESSION: No abnormal enhancement seen in either breast.  RECOMMENDATION: Bilateral screening mammogram in September of 2016 is recommended.  The American Cancer Society recommends annual MRI and mammography in patients with an  estimated lifetime risk of developing breast cancer greater than 20 - 25%, or who are known or suspected to be positive for the breast cancer gene.  BI-RADS CATEGORY  1: Negative.   Electronically Signed   By: Lillia Mountain M.D.   On: 01/22/2014 10:25    Assessment & Plan:   Marijke was seen today for hypertension and annual exam.  Diagnoses and all orders for this visit:  Essential hypertension- her BP is on the low side so she may be over treated, will hold the ARB/HCTZ combo for now, she will monitor her BP at home Orders: -     Comprehensive metabolic panel; Future -     CBC with Differential/Platelet; Future  Osteopenia- she agrees to have a DEXA scan done Orders: -     DG Bone Density; Future  Hyperglycemia- will check her A1C to see if she has developed DM2 Orders: -     Comprehensive metabolic panel; Future -     Hemoglobin A1c; Future  Pure hypercholesterolemia Orders: -     Lipid panel; Future -     TSH; Future  Other orders -     Cancel: losartan-hydrochlorothiazide (HYZAAR) 100-12.5 MG per tablet; TAKE 1 TABLET ONCE DAILY.   I have discontinued Ms. Dumire losartan-hydrochlorothiazide. I  am also having her maintain her multivitamin, calcium carbonate, Vitamin D3, and Probiotic Product (PROBIOTIC PO).  No orders of the defined types were placed in this encounter.     Follow-up: Return in about 2 months (around 01/21/2015).  Scarlette Calico, MD

## 2014-11-20 NOTE — Patient Instructions (Signed)

## 2014-11-21 ENCOUNTER — Encounter: Payer: Self-pay | Admitting: Internal Medicine

## 2015-01-21 ENCOUNTER — Ambulatory Visit: Payer: BLUE CROSS/BLUE SHIELD

## 2015-01-21 VITALS — BP 138/88

## 2015-01-21 DIAGNOSIS — I10 Essential (primary) hypertension: Secondary | ICD-10-CM

## 2015-01-21 NOTE — Progress Notes (Signed)
Patient came in (nurse visit) to have bp rechecked--patient states she has lost weight and also has been off bp meds for appx 2 months now, per dr Ronnald Ramp, she came in to have bp checked---bp reading today was documented at 138/88, per dr Ronnald Ramp, he has advised patient ok to stop bp meds for awhile and keep checking bp to make sure bp numbers are good without medication

## 2015-01-21 NOTE — Progress Notes (Signed)
   Subjective:    Patient ID: Diane Day, female    DOB: 1951/05/13, 64 y.o.   MRN: [id number]  HPI    Review of Systems     Objective:   Physical Exam        Assessment & Plan:

## 2015-02-11 ENCOUNTER — Ambulatory Visit (INDEPENDENT_AMBULATORY_CARE_PROVIDER_SITE_OTHER): Payer: BLUE CROSS/BLUE SHIELD | Admitting: Obstetrics and Gynecology

## 2015-02-11 ENCOUNTER — Encounter: Payer: Self-pay | Admitting: Obstetrics and Gynecology

## 2015-02-11 VITALS — BP 150/100 | HR 80 | Temp 98.2°F | Resp 16 | Ht 66.5 in | Wt 127.0 lb

## 2015-02-11 DIAGNOSIS — Z0000 Encounter for general adult medical examination without abnormal findings: Secondary | ICD-10-CM | POA: Diagnosis not present

## 2015-02-11 DIAGNOSIS — R3915 Urgency of urination: Secondary | ICD-10-CM | POA: Diagnosis not present

## 2015-02-11 DIAGNOSIS — Z01419 Encounter for gynecological examination (general) (routine) without abnormal findings: Secondary | ICD-10-CM | POA: Diagnosis not present

## 2015-02-11 LAB — POCT URINALYSIS DIPSTICK
Bilirubin, UA: NEGATIVE
Blood, UA: NEGATIVE
Glucose, UA: NEGATIVE
Ketones, UA: NEGATIVE
Nitrite, UA: NEGATIVE
PROTEIN UA: NEGATIVE
UROBILINOGEN UA: NEGATIVE
pH, UA: 6

## 2015-02-11 MED ORDER — SULFAMETHOXAZOLE-TRIMETHOPRIM 800-160 MG PO TABS: 1.0000 | ORAL_TABLET | Freq: Two times a day (BID) | ORAL | Status: DC

## 2015-02-11 NOTE — Patient Instructions (Addendum)
EXERCISE AND DIET:  We recommended that you start or continue a regular exercise program for good health. Regular exercise means any activity that makes your heart beat faster and makes you sweat.  We recommend exercising at least 30 minutes per day at least 3 days a week, preferably 4 or 5.  We also recommend a diet low in fat and sugar.  Inactivity, poor dietary choices and obesity can cause diabetes, heart attack, stroke, and kidney damage, among others.    ALCOHOL AND SMOKING:  Women should limit their alcohol intake to no more than 7 drinks/beers/glasses of wine (combined, not each!) per week. Moderation of alcohol intake to this level decreases your risk of breast cancer and liver damage. And of course, no recreational drugs are part of a healthy lifestyle.  And absolutely no smoking or even second hand smoke. Most people know smoking can cause heart and lung diseases, but did you know it also contributes to weakening of your bones? Aging of your skin?  Yellowing of your teeth and nails?  CALCIUM AND VITAMIN D:  Adequate intake of calcium and Vitamin D are recommended.  The recommendations for exact amounts of these supplements seem to change often, but generally speaking 600 mg of calcium (either carbonate or citrate) and 800 units of Vitamin D per day seems prudent. Certain women may benefit from higher intake of Vitamin D.  If you are among these women, your doctor will have told you during your visit.    PAP SMEARS:  Pap smears, to check for cervical cancer or precancers,  have traditionally been done yearly, although recent scientific advances have shown that most women can have pap smears less often.  However, every woman still should have a physical exam from her gynecologist every year. It will include a breast check, inspection of the vulva and vagina to check for abnormal growths or skin changes, a visual exam of the cervix, and then an exam to evaluate the size and shape of the uterus and  ovaries.  And after 62 years of age, a rectal exam is indicated to check for rectal cancers. We will also provide age appropriate advice regarding health maintenance, like when you should have certain vaccines, screening for sexually transmitted diseases, bone density testing, colonoscopy, mammograms, etc.   MAMMOGRAMS:  All women over 40 years old should have a yearly mammogram. Many facilities now offer a "3D" mammogram, which may cost around $50 extra out of pocket. If possible,  we recommend you accept the option to have the 3D mammogram performed.  It both reduces the number of women who will be called back for extra views which then turn out to be normal, and it is better than the routine mammogram at detecting truly abnormal areas.    COLONOSCOPY:  Colonoscopy to screen for colon cancer is recommended for all women at age 50.  We know, you hate the idea of the prep.  We agree, BUT, having colon cancer and not knowing it is worse!!  Colon cancer so often starts as a polyp that can be seen and removed at colonscopy, which can quite literally save your life!  And if your first colonoscopy is normal and you have no family history of colon cancer, most women don't have to have it again for 10 years.  Once every ten years, you can do something that may end up saving your life, right?  We will be happy to help you get it scheduled when you are ready.    Be sure to check your insurance coverage so you understand how much it will cost.  It may be covered as a preventative service at no cost, but you should check your particular policy.     Raloxifene tablets What is this medicine? RALOXIFENE (ral OX i feen) reduces the amount of calcium lost from bones. It is used to treat and prevent osteoporosis in women who have experienced menopause. This medicine may be used for other purposes; ask your health care provider or pharmacist if you have questions. COMMON BRAND NAME(S): Evista What should I tell my health  care provider before I take this medicine? They need to know if you have any of these conditions: -a history of blood clots -cancer -heart failure -liver disease -premenopausal -an unusual or allergic reaction to raloxifene, other medicines, foods, dyes, or preservatives -pregnant or trying to get pregnant -breast-feeding How should I use this medicine? Take this medicine by mouth with a glass of water. Follow the directions on the prescription label. The tablets can be taken with or without food. Take your doses at regular intervals. Do not take your medicine more often than directed. Talk to your pediatrician regarding the use of this medicine in children. Special care may be needed. Overdosage: If you think you have taken too much of this medicine contact a poison control center or emergency room at once. NOTE: This medicine is only for you. Do not share this medicine with others. What if I miss a dose? If you miss a dose, take it as soon as you can. If it is almost time for your next dose, take only that dose. Do not take double or extra doses. What may interact with this medicine? -ampicillin -cholestyramine -colestipol -diazepam -diazoxide -female hormones like hormone replacement therapy -lidocaine -warfarin This list may not describe all possible interactions. Give your health care provider a list of all the medicines, herbs, non-prescription drugs, or dietary supplements you use. Also tell them if you smoke, drink alcohol, or use illegal drugs. Some items may interact with your medicine. What should I watch for while using this medicine? Visit your doctor or health care professional for regular checks on your progress. Do not stop taking this medicine except on the advice of your doctor or health care professional. You should make sure you get enough calcium and vitamin D in your diet while you are taking this medicine. Discuss your dietary needs with your health care  professional or nutritionist. Exercise may help to prevent bone loss. Discuss your exercise needs with your doctor or health care professional. This medicine can rarely cause blood clots. You should avoid long periods of bed rest while taking this medicine. If you are going to have surgery, tell your doctor or health care professional that you are taking this medicine. This medicine should be stopped at least 3 days before surgery. After surgery, it should be restarted only after you are walking again. It should not be restarted while you still need long periods of bed rest. You should not smoke while taking this medicine. Smoking may also increase your risk of blood clots. Smoking can also decrease the effects of this medicine. This medicine does not prevent hot flashes. It may cause hot flashes in some patients at the start of therapy. What side effects may I notice from receiving this medicine? Side effects that you should report to your doctor or health care professional as soon as possible: -change in vision -chest pain -difficulty breathing -leg pain  or swelling -skin rash, itching Side effects that usually do not require medical attention (report to your doctor or health care professional if they continue or are bothersome): -fluid build-up -leg cramps -stomach pain -sweating This list may not describe all possible side effects. Call your doctor for medical advice about side effects. You may report side effects to FDA at 1-800-FDA-1088. Where should I keep my medicine? Keep out of the reach of children. Store at room temperature between 15 and 30 degrees C (59 and 86 degrees F). Throw away any unused medicine after the expiration date. NOTE: This sheet is a summary. It may not cover all possible information. If you have questions about this medicine, talk to your doctor, pharmacist, or health care provider.  2015, Elsevier/Gold Standard. (2008-04-17 15:15:14)

## 2015-02-11 NOTE — Progress Notes (Signed)
64 y.o. G37P3003 Unknown Caucasian female here for annual exam.    Family history of breast cancer - mother, sister, sister. Sister did genetic testing - Negative for BRCA. Patient has had genetic counseling but did not do testing.  Had breast MRI in 2015 which was negative.   Went to [facility] and returned ill with a respiratory infection. Coughing and does not feel very well.   PCP: Scarlette Calico  [name] was 05/16/2006.          Sexually active: Yes.    [name]    Exercising: Yes.    Swimming, walking Smoker:  no  Health Maintenance: Pap:  11/30/12 ASCUS. HR HPV: Neg History of abnormal Pap:  Yes.  See above. MMG:  01/23/15 BIRADS1:Neg Colonoscopy:  08/29/12 Mild diverticulosis. Repeat 10 years  BMD:   2009   Result  Osteopenia. TDaP:  UTD w/ PCP Screening Labs:  Hb today: PCP, Urine today: WBC=Trace - Feeling some urgency yesterday.    reports that she quit smoking about 40 years ago. Her smoking use included Cigarettes. She has a 2.5 pack-year smoking history. She has never used smokeless tobacco. She reports that she drinks about 4.2 oz of alcohol per week. She reports that she does not use illicit drugs.  Past Medical History  Diagnosis Date  . Rectal bleeding   . Hypertension   . Osteopenia   . Cancer     basal cell, face    Past Surgical History  Procedure Laterality Date  . Repair broken arm  2008  . Tubal ligation  1994  . Breast surgery Left 1998    remove fibroadenoma  . Knee arthroscopy Left     Current Outpatient Prescriptions  Medication Sig Dispense Refill  . aspirin 81 MG tablet Take 81 mg by mouth daily.    . calcium carbonate (TUMS - DOSED IN MG ELEMENTAL CALCIUM) 500 MG chewable tablet Chew 1 tablet by mouth as needed.     . Cholecalciferol (VITAMIN D3) 2000 UNITS TABS Take by mouth every morning.      . Multiple Vitamin (MULTIVITAMIN) tablet Take 1 tablet  by mouth daily.      . Probiotic Product (PROBIOTIC PO) Take by mouth daily.      No current facility-administered medications for this visit.    Family History  Problem Relation Age of Onset  . Diabetes Father   . Heart disease Father   . Hypertension Father   . Esophageal cancer Father 64  . Breast cancer Mother 98  . Hypertension Mother   . Osteoporosis Mother   . Breast cancer Sister 15    BRCA negative  . Diabetes Sister   . Colon cancer Neg Hx   . Stomach cancer Neg Hx   . Rectal cancer Neg Hx   . Breast cancer Sister 59  . Cancer Paternal Aunt     unknown form of cancer over the age of 20  . Thyroid cancer Cousin     maternal first cousin  . Breast cancer Cousin     died in her 25s; paternal cousin  . Breast cancer Cousin     paternal first cousin  . Osteoporosis Brother     ROS:  Pertinent items are noted in HPI.  Otherwise, a comprehensive ROS was negative.  Exam:   BP 150/100 mmHg  Pulse 80  Temp(Src) 98.2 F (36.8 C) (Oral)  Resp 16  Ht 5' 6.5" (1.689 m)  Wt 127 lb (57.607 kg)  BMI 20.19 kg/m2  LMP 05/16/2006    General appearance: alert, cooperative and appears stated age Head: Normocephalic, without obvious abnormality, atraumatic Neck: no adenopathy, supple, symmetrical, trachea midline and thyroid normal to inspection and palpation Lungs: clear to auscultation bilaterally Breasts: normal appearance, no masses or tenderness, Inspection negative, No nipple retraction or dimpling, No nipple discharge or bleeding, No axillary or supraclavicular adenopathy Heart: regular rate and rhythm Abdomen: soft, non-tender; bowel sounds normal; no masses,  no organomegaly Extremities: extremities normal, atraumatic, no cyanosis or edema Skin: Skin color, texture, turgor normal. No rashes or lesions Lymph nodes: Cervical, supraclavicular, and axillary nodes normal. No abnormal inguinal nodes palpated Neurologic: Grossly normal  Pelvic: External genitalia:  no  lesions              Urethra:  normal appearing urethra with no masses, tenderness or lesions              Bartholins and Skenes: normal                 Vagina: normal appearing vagina with normal color and discharge, no lesions              Cervix: no lesions              Pap taken: no. Bimanual Exam:  Uterus:  normal size, contour, position, consistency, mobility, non-tender              Adnexa: normal adnexa and no mass, fullness, tenderness              Rectovaginal: Yes.  .  Confirms.              Anus:  normal sphincter tone, no lesions  Chaperone was present for exam.  Assessment:   Well woman visit with normal exam. Hx ASCUS pap.  Urinary urgency.  FH breast cancer.   Plan: Yearly mammogram recommended after age 48.  MRI of breast every 2 years. Recommended self breast exam.  Pap and HR HPV as above.  Due next year. Discussed Calcium, Vitamin D, regular exercise program including cardiovascular and weight bearing exercise. Labs performed.  Yes.  .   See orders.  Urine culture. Refills given on medications.  Rx for Bactrim DS po bid for 3 days.  Discussed AZO for dysuria if needed. Recommended bone density.  Patient will consider.  I discussed Evista as a possible treatment of osteopenia/osteoporosis if needed in future. Follow up annually and prn.      After visit summary provided.

## 2015-02-13 ENCOUNTER — Telehealth: Payer: Self-pay | Admitting: Obstetrics and Gynecology

## 2015-02-13 LAB — URINE CULTURE: Colony Count: 40000

## 2015-02-13 NOTE — Telephone Encounter (Signed)
I left message on call phone per DPI.  UC results negative.  Abx not needed.

## 2015-03-05 ENCOUNTER — Telehealth: Payer: Self-pay | Admitting: *Deleted

## 2015-03-05 ENCOUNTER — Other Ambulatory Visit: Payer: Self-pay | Admitting: Internal Medicine

## 2015-03-05 DIAGNOSIS — I10 Essential (primary) hypertension: Secondary | ICD-10-CM

## 2015-03-05 MED ORDER — AZILSARTAN MEDOXOMIL 40 MG PO TABS: 1.0000 | ORAL_TABLET | Freq: Every day | ORAL | Status: DC

## 2015-03-05 NOTE — Telephone Encounter (Signed)
Left msg on triage stating may need to start back on her BP medicine again. BP has started running high. Tried to call pt back to get ranges of BP no answer LMOM RTC...Diane Day

## 2015-03-05 NOTE — Telephone Encounter (Signed)
Notified pt with md response.../lmb 

## 2015-03-05 NOTE — Telephone Encounter (Signed)
I have sent an Rx to her pharmacy - it is similar to what she took before but there is no water pill in this one Ask her to come in soon for a BP check

## 2016-02-09 ENCOUNTER — Encounter: Payer: Self-pay | Admitting: Obstetrics and Gynecology

## 2016-02-24 ENCOUNTER — Ambulatory Visit (INDEPENDENT_AMBULATORY_CARE_PROVIDER_SITE_OTHER): Payer: BLUE CROSS/BLUE SHIELD | Admitting: Obstetrics and Gynecology

## 2016-02-24 ENCOUNTER — Encounter: Payer: Self-pay | Admitting: Obstetrics and Gynecology

## 2016-02-24 VITALS — BP 130/90 | HR 80 | Resp 14 | Ht 66.5 in | Wt 124.0 lb

## 2016-02-24 DIAGNOSIS — Z0000 Encounter for general adult medical examination without abnormal findings: Secondary | ICD-10-CM | POA: Diagnosis not present

## 2016-02-24 DIAGNOSIS — Z01419 Encounter for gynecological examination (general) (routine) without abnormal findings: Secondary | ICD-10-CM | POA: Diagnosis not present

## 2016-02-24 LAB — POCT URINALYSIS DIPSTICK
BILIRUBIN UA: NEGATIVE
Blood, UA: NEGATIVE
Glucose, UA: NEGATIVE
KETONES UA: NEGATIVE
LEUKOCYTES UA: NEGATIVE
NITRITE UA: NEGATIVE
PH UA: 6
PROTEIN UA: NEGATIVE
Urobilinogen, UA: NEGATIVE

## 2016-02-24 LAB — HM MAMMOGRAPHY: HM Mammogram: ABNORMAL — AB (ref 0–4)

## 2016-02-24 LAB — HM PAP SMEAR

## 2016-02-24 NOTE — Progress Notes (Signed)
65 y.o. G89P3003 Unknown Caucasian female here for annual exam.    Family history of breast cancer - mother, sister, sister. Sister did genetic testing - Negative for BRCA. Patient has had genetic counseling but did not do testing.  Had breast MRI in 2015 which was negative.   Went to [place].  Saw the [employer].   Thinks she as a genital wart.  Hx of this past and tx with cryo therapy?  PCP:   Scarlette Calico  [name] was 05/16/2006.           Sexually active: Yes.    [name]    Exercising: Yes.    Swim, walking Smoker:  no  Health Maintenance: Pap:  11/30/12 ASCUS. HR HPV: Neg History of abnormal Pap:  yes MMG:  01/28/16 BIRADS1 negative.  Cat C density. Did 3D. Colonoscopy:  08/29/12 Mild diverticulosis. Repeat 10 years  BMD:   2009  Result  Osteopenia TDaP: 11/2012  Screening Labs: PCP, Urine today: negative.   reports that she quit smoking about 41 years ago. Her smoking use included Cigarettes. She has a 2.50 pack-year smoking history. She has never used smokeless tobacco. She reports that she drinks about 4.2 oz of alcohol per week . She reports that she does not use drugs.  Past Medical History:  Diagnosis Date  . Cancer ([place])    basal cell, face  . Hypertension   . Osteopenia   . Rectal bleeding     Past Surgical History:  Procedure Laterality Date  . BREAST SURGERY Left 1998   remove fibroadenoma  . KNEE ARTHROSCOPY Left   . repair broken arm  2008  . TUBAL LIGATION  1994    Current Outpatient Prescriptions  Medication Sig Dispense Refill  . aspirin 81 MG tablet Take 81 mg by mouth daily.    . Azilsartan Medoxomil (EDARBI) 40 MG TABS Take 1 tablet by mouth daily. 30 tablet 11  . calcium carbonate (TUMS - DOSED IN MG ELEMENTAL CALCIUM) 500 MG chewable tablet Chew 1 tablet by mouth as needed.     . Cholecalciferol (VITAMIN D3) 2000 UNITS TABS Take by mouth every morning.       . Multiple Vitamin (MULTIVITAMIN) tablet Take 1 tablet by mouth daily.      . Probiotic Product (PROBIOTIC PO) Take by mouth daily.      No current facility-administered medications for this visit.     Family History  Problem Relation Age of Onset  . Diabetes Father   . Heart disease Father   . Hypertension Father   . Esophageal cancer Father 2  . Breast cancer Mother 53  . Hypertension Mother   . Osteoporosis Mother   . Breast cancer Sister 46    BRCA negative  . Diabetes Sister   . Breast cancer Sister 74  . Cancer Paternal Aunt     unknown form of cancer over the age of 49  . Thyroid cancer Cousin     maternal first cousin  . Breast cancer Cousin     died in her 64s; paternal cousin  . Breast cancer Cousin     paternal first cousin  . Osteoporosis Brother   . Colon cancer Neg Hx   . Stomach cancer Neg Hx   . Rectal cancer Neg Hx     ROS:  Pertinent items are noted in HPI.  Otherwise, a comprehensive ROS was negative.  Exam:  BP 130/90 (BP Location: Right Arm, Patient Position: Sitting, Cuff Size: Normal)   Pulse 80   Resp 14   Ht 5' 6.5" (1.689 m)   Wt 124 lb (56.2 kg)   LMP 05/16/2006   BMI 19.71 kg/m     General appearance: alert, cooperative and appears stated age Head: Normocephalic, without obvious abnormality, atraumatic Neck: no adenopathy, supple, symmetrical, trachea midline and thyroid normal to inspection and palpation Lungs: clear to auscultation bilaterally Breasts: normal appearance, no masses or tenderness, No nipple retraction or dimpling, No nipple discharge or bleeding, No axillary or supraclavicular adenopathy Heart: regular rate and rhythm Abdomen: soft, non-tender; no masses, no organomegaly Extremities: extremities normal, atraumatic, no cyanosis or edema Skin: Skin color, texture, turgor normal. No rashes or lesions Lymph nodes: Cervical, supraclavicular, and axillary nodes normal. No abnormal inguinal nodes palpated Neurologic:  Grossly normal  Pelvic: External genitalia:  no lesions.  3 mm left labial sebaceous cyst.               Urethra:  normal appearing urethra with no masses, tenderness or lesions              Bartholins and Skenes: normal                 Vagina: normal appearing vagina with normal color and discharge, no lesions              Cervix: no lesions.  Slight bleeding with pap.              Pap taken: Yes.   Bimanual Exam:  Uterus:  normal size, contour, position, consistency, mobility, non-tender              Adnexa: no mass, fullness, tenderness              Rectal exam: Yes.  .  Confirms.              Anus:  normal sphincter tone, no lesions  Chaperone was present for exam.  Assessment:   Well woman visit with normal exam. Osteopenia.  Sebaceous cyst of the left labia.  Hx ASCUS and negative HR HPV.  Plan: Yearly mammogram recommended after age 40.  Patient will call to inquire with her insurance about an MRI of breast. She will call office back if she wishes to proceed.  She knows this needs to be done within 3 months of her mammogram. Will schedule BMD at [facility].  We will fax order and patient will call to schedule.  Recommended self breast exam.  Pap and HR HPV as above. Discussed Calcium, Vitamin D, regular exercise program including cardiovascular and weight bearing exercise.   Follow up annually and prn.       After visit summary provided.      

## 2016-02-24 NOTE — Patient Instructions (Signed)

## 2016-02-25 LAB — HM DEXA SCAN

## 2016-02-26 LAB — IPS PAP TEST WITH HPV

## 2016-03-04 ENCOUNTER — Telehealth: Payer: Self-pay | Admitting: Obstetrics and Gynecology

## 2016-03-04 DIAGNOSIS — Z803 Family history of malignant neoplasm of breast: Secondary | ICD-10-CM

## 2016-03-04 NOTE — Telephone Encounter (Signed)
Left message to call Sharee Pimple at [phone].    Patient requesting Breast MRI for [facility]. Last MMG 02/24/16; AEX 02/24/16. Order placed.       Cc: Lerry Liner

## 2016-03-04 NOTE — Telephone Encounter (Signed)
Return call to Jill. °

## 2016-03-04 NOTE — Telephone Encounter (Signed)
Spoke with patient. Advised patient order placed for Breast MRI at [facility]. [facility] will call to schedule appt. Patient verbalizes understanding and is agreeable.    Routing to provider for final review. Patient is agreeable to disposition. Will close encounter.

## 2016-03-04 NOTE — Telephone Encounter (Signed)
Patient left voicemail at lunch saying she needs and order for a Breast MRI sent to [facility].

## 2016-03-17 ENCOUNTER — Telehealth: Payer: Self-pay

## 2016-03-17 NOTE — Telephone Encounter (Signed)
Spoke with patient and notified of BMD results. Made appointment with Dr. Quincy Simmonds for 03-24-16 at 4:00pm. Patient to arrive at 3:45 to register.

## 2016-03-22 ENCOUNTER — Ambulatory Visit (INDEPENDENT_AMBULATORY_CARE_PROVIDER_SITE_OTHER): Payer: BLUE CROSS/BLUE SHIELD | Admitting: Internal Medicine

## 2016-03-22 ENCOUNTER — Other Ambulatory Visit (INDEPENDENT_AMBULATORY_CARE_PROVIDER_SITE_OTHER): Payer: BLUE CROSS/BLUE SHIELD

## 2016-03-22 ENCOUNTER — Ambulatory Visit
Admission: RE | Admit: 2016-03-22 | Discharge: 2016-03-22 | Disposition: A | Discharge status: Home / Self Care | Payer: BLUE CROSS/BLUE SHIELD | Source: Ambulatory Visit | Attending: Obstetrics and Gynecology | Admitting: Obstetrics and Gynecology

## 2016-03-22 ENCOUNTER — Encounter: Payer: Self-pay | Admitting: Internal Medicine

## 2016-03-22 VITALS — BP 118/78 | HR 79 | Temp 98.1°F | Resp 16 | Ht 66.5 in | Wt 125.5 lb

## 2016-03-22 DIAGNOSIS — Z0000 Encounter for general adult medical examination without abnormal findings: Secondary | ICD-10-CM | POA: Diagnosis not present

## 2016-03-22 DIAGNOSIS — Z803 Family history of malignant neoplasm of breast: Secondary | ICD-10-CM

## 2016-03-22 DIAGNOSIS — Z23 Encounter for immunization: Secondary | ICD-10-CM | POA: Diagnosis not present

## 2016-03-22 DIAGNOSIS — I10 Essential (primary) hypertension: Secondary | ICD-10-CM | POA: Diagnosis not present

## 2016-03-22 LAB — CBC WITH DIFFERENTIAL/PLATELET
BASOS PCT: 0.7 % (ref 0.0–3.0)
Basophils Absolute: 0.1 10*3/uL (ref 0.0–0.1)
EOS PCT: 0.3 % (ref 0.0–5.0)
Eosinophils Absolute: 0 10*3/uL (ref 0.0–0.7)
HEMATOCRIT: 41.8 % (ref 36.0–46.0)
Hemoglobin: 14.1 g/dL (ref 12.0–15.0)
LYMPHS PCT: 27.5 % (ref 12.0–46.0)
Lymphs Abs: 2.2 10*3/uL (ref 0.7–4.0)
MCHC: 33.7 g/dL (ref 30.0–36.0)
MCV: 88.8 fl (ref 78.0–100.0)
MONOS PCT: 6.6 % (ref 3.0–12.0)
Monocytes Absolute: 0.5 10*3/uL (ref 0.1–1.0)
NEUTROS ABS: 5.1 10*3/uL (ref 1.4–7.7)
Neutrophils Relative %: 64.9 % (ref 43.0–77.0)
PLATELETS: 226 10*3/uL (ref 150.0–400.0)
RBC: 4.71 Mil/uL (ref 3.87–5.11)
RDW: 14.1 % (ref 11.5–15.5)
WBC: 7.9 10*3/uL (ref 4.0–10.5)

## 2016-03-22 LAB — COMPREHENSIVE METABOLIC PANEL
ALT: 17 U/L (ref 0–35)
AST: 19 U/L (ref 0–37)
Albumin: 4.7 g/dL (ref 3.5–5.2)
Alkaline Phosphatase: 56 U/L (ref 39–117)
BUN: 9 mg/dL (ref 6–23)
CALCIUM: 10 mg/dL (ref 8.4–10.5)
CHLORIDE: 106 meq/L (ref 96–112)
CO2: 30 meq/L (ref 19–32)
CREATININE: 0.82 mg/dL (ref 0.40–1.20)
GFR: 74.85 mL/min (ref >60.00)
Glucose, Bld: 92 mg/dL (ref 70–99)
Potassium: 4.8 mEq/L (ref 3.5–5.1)
Sodium: 142 mEq/L (ref 135–145)
Total Bilirubin: 0.7 mg/dL (ref 0.2–1.2)
Total Protein: 6.7 g/dL (ref 6.0–8.3)

## 2016-03-22 LAB — LIPID PANEL
CHOL/HDL RATIO: 2
Cholesterol: 198 mg/dL (ref 0–200)
HDL: 90.6 mg/dL (ref >39.00)
LDL CALC: 96 mg/dL (ref 0–99)
NonHDL: 107.09
TRIGLYCERIDES: 55 mg/dL (ref 0.0–149.0)
VLDL: 11 mg/dL (ref 0.0–40.0)

## 2016-03-22 LAB — TSH: TSH: 1.16 u[IU]/mL (ref 0.35–4.50)

## 2016-03-22 MED ORDER — GADOBENATE DIMEGLUMINE 529 MG/ML IV SOLN
11.0000 mL | Freq: Once | INTRAVENOUS | Status: AC | PRN
  Administered 2016-03-22: 11 mL via INTRAVENOUS

## 2016-03-22 NOTE — Progress Notes (Signed)
Pre visit review using our clinic review tool, if applicable. No additional management support is needed unless otherwise documented below in the visit note. 

## 2016-03-22 NOTE — Patient Instructions (Signed)
Hypertension Hypertension, commonly called high blood pressure, is when the force of blood pumping through your arteries is too strong. Your arteries are the blood vessels that carry blood from your heart throughout your body. A blood pressure reading consists of a higher number over a lower number, such as 110/72. The higher number (systolic) is the pressure inside your arteries when your heart pumps. The lower number (diastolic) is the pressure inside your arteries when your heart relaxes. Ideally you want your blood pressure below 120/80. Hypertension forces your heart to work harder to pump blood. Your arteries may become narrow or stiff. Having untreated or uncontrolled hypertension can cause heart attack, stroke, kidney disease, and other problems. RISK FACTORS Some risk factors for high blood pressure are controllable. Others are not.  Risk factors you cannot control include:   Race. You may be at higher risk if you are African American.  Age. Risk increases with age.  Gender. Men are at higher risk than women before age 45 years. After age 65, women are at higher risk than men. Risk factors you can control include:  Not getting enough exercise or physical activity.  Being overweight.  Getting too much fat, sugar, calories, or salt in your diet.  Drinking too much alcohol. SIGNS AND SYMPTOMS Hypertension does not usually cause signs or symptoms. Extremely high blood pressure (hypertensive crisis) may cause headache, anxiety, shortness of breath, and nosebleed. DIAGNOSIS To check if you have hypertension, your health care provider will measure your blood pressure while you are seated, with your arm held at the level of your heart. It should be measured at least twice using the same arm. Certain conditions can cause a difference in blood pressure between your right and left arms. A blood pressure reading that is higher than normal on one occasion does not mean that you need treatment. If  it is not clear whether you have high blood pressure, you may be asked to return on a different day to have your blood pressure checked again. Or, you may be asked to monitor your blood pressure at home for 1 or more weeks. TREATMENT Treating high blood pressure includes making lifestyle changes and possibly taking medicine. Living a healthy lifestyle can help lower high blood pressure. You may need to change some of your habits. Lifestyle changes may include:  Following the DASH diet. This diet is high in fruits, vegetables, and whole grains. It is low in salt, red meat, and added sugars.  Keep your sodium intake below 2,300 mg per day.  Getting at least 30-45 minutes of aerobic exercise at least 4 times per week.  Losing weight if necessary.  Not smoking.  Limiting alcoholic beverages.  Learning ways to reduce stress. Your health care provider may prescribe medicine if lifestyle changes are not enough to get your blood pressure under control, and if one of the following is true:  You are 18-59 years of age and your systolic blood pressure is above 140.  You are 60 years of age or older, and your systolic blood pressure is above 150.  Your diastolic blood pressure is above 90.  You have diabetes, and your systolic blood pressure is over 140 or your diastolic blood pressure is over 90.  You have kidney disease and your blood pressure is above 140/90.  You have heart disease and your blood pressure is above 140/90. Your personal target blood pressure may vary depending on your medical conditions, your age, and other factors. HOME CARE INSTRUCTIONS    Have your blood pressure rechecked as directed by your health care provider.   Take medicines only as directed by your health care provider. Follow the directions carefully. Blood pressure medicines must be taken as prescribed. The medicine does not work as well when you skip doses. Skipping doses also puts you at risk for  problems.  Do not smoke.   Monitor your blood pressure at home as directed by your health care provider. SEEK MEDICAL CARE IF:   You think you are having a reaction to medicines taken.  You have recurrent headaches or feel dizzy.  You have swelling in your ankles.  You have trouble with your vision. SEEK IMMEDIATE MEDICAL CARE IF:  You develop a severe headache or confusion.  You have unusual weakness, numbness, or feel faint.  You have severe chest or abdominal pain.  You vomit repeatedly.  You have trouble breathing. MAKE SURE YOU:   Understand these instructions.  Will watch your condition.  Will get help right away if you are not doing well or get worse.   This information is not intended to replace advice given to you by your health care provider. Make sure you discuss any questions you have with your health care provider.   Document Released: 05/02/2005 Document Revised: 09/16/2014 Document Reviewed: 02/22/2013 Elsevier Interactive Patient Education 2016 Elsevier Inc.  

## 2016-03-22 NOTE — Progress Notes (Signed)
Subjective:  Patient ID: Diane Day, female    DOB: [birth year]  Age: 65 y.o. MRN: [id number]  CC: Hypertension and Annual Exam   HPI AMIAYA ARVIZU presents for a CPX and BP check. She recently saw her gynecologist and had her Pap smear, mammogram, and bone density performed. She tells me her blood pressure has been well controlled. She has had no episodes of headache/blurred vision/chest pain/shortness of breath/palpitations/edema/fatigue.  Outpatient Medications Prior to Visit  Medication Sig Dispense Refill  . aspirin 81 MG tablet Take 81 mg by mouth daily.    . Azilsartan Medoxomil (EDARBI) 40 MG TABS Take 1 tablet by mouth daily. 30 tablet 11  . calcium carbonate (TUMS - DOSED IN MG ELEMENTAL CALCIUM) 500 MG chewable tablet Chew 1 tablet by mouth as needed.     . Cholecalciferol (VITAMIN D3) 2000 UNITS TABS Take by mouth every morning.      . Probiotic Product (PROBIOTIC PO) Take by mouth daily.     . Multiple Vitamin (MULTIVITAMIN) tablet Take 1 tablet by mouth daily.       No facility-administered medications prior to visit.     ROS Review of Systems  Constitutional: Negative.  Negative for activity change, appetite change, diaphoresis and fatigue.  HENT: Negative.   Eyes: Negative.  Negative for visual disturbance.  Respiratory: Negative.  Negative for cough, choking, chest tightness, shortness of breath and stridor.   Cardiovascular: Negative.  Negative for chest pain, palpitations and leg swelling.  Gastrointestinal: Negative.  Negative for abdominal pain, constipation, diarrhea, nausea and vomiting.  Endocrine: Negative.   Genitourinary: Negative.  Negative for difficulty urinating and dysuria.  Musculoskeletal: Negative.  Negative for arthralgias, back pain, myalgias and neck pain.  Skin: Negative.   Allergic/Immunologic: Negative.   Neurological: Negative.  Negative for dizziness, tremors, weakness, numbness and headaches.  Hematological: Negative for  adenopathy. Does not bruise/bleed easily.  Psychiatric/Behavioral: Negative.     Objective:  BP 118/78 (BP Location: Left Arm, Patient Position: Sitting, Cuff Size: Normal)   Pulse 79   Temp 98.1 F (36.7 C) (Oral)   Resp 16   Ht 5' 6.5" (1.689 m)   Wt 125 lb 8 oz (56.9 kg)   LMP 05/16/2006   SpO2 98%   BMI 19.95 kg/m   BP Readings from Last 3 Encounters:  03/22/16 118/78  02/24/16 130/90  02/11/15 (!) 150/100    Wt Readings from Last 3 Encounters:  03/22/16 125 lb 8 oz (56.9 kg)  02/24/16 124 lb (56.2 kg)  02/11/15 127 lb (57.6 kg)    Physical Exam  Constitutional: She is oriented to person, place, and time. No distress.  HENT:  Mouth/Throat: Oropharynx is clear and moist. No oropharyngeal exudate.  Eyes: Conjunctivae are normal. Right eye exhibits no discharge. Left eye exhibits no discharge. No scleral icterus.  Neck: Normal range of motion. Neck supple. No JVD present. No tracheal deviation present. No thyromegaly present.  Cardiovascular: Normal rate, regular rhythm, normal heart sounds and intact distal pulses.  Exam reveals no gallop and no friction rub.   No murmur heard. Pulmonary/Chest: Effort normal and breath sounds normal. No stridor. No respiratory distress. She has no wheezes. She has no rales. She exhibits no tenderness.  Abdominal: Soft. Bowel sounds are normal. She exhibits no distension and no mass. There is no tenderness. There is no rebound and no guarding.  Musculoskeletal: Normal range of motion. She exhibits no edema, tenderness or deformity.  Lymphadenopathy:    She  has no cervical adenopathy.  Neurological: She is oriented to person, place, and time.  Skin: Skin is warm and dry. No rash noted. She is not diaphoretic. No erythema. No pallor.  Psychiatric: She has a normal mood and affect. Her behavior is normal. Judgment and thought content normal.  Vitals reviewed.   Lab Results  Component Value Date   WBC 7.0 11/20/2014   HGB 14.2  11/20/2014   HCT 42.6 11/20/2014   PLT 263.0 11/20/2014   GLUCOSE 92 11/20/2014   CHOL 200 11/20/2014   TRIG 40.0 11/20/2014   HDL 77.80 11/20/2014   LDLCALC 114 (H) 11/20/2014   ALT 27 11/20/2014   AST 20 11/20/2014   NA 139 11/20/2014   K 4.0 11/20/2014   CL 103 11/20/2014   CREATININE 0.83 11/20/2014   BUN 15 11/20/2014   CO2 29 11/20/2014   TSH 0.78 11/20/2014   HGBA1C 5.6 11/20/2014    No results found.  Assessment & Plan:   Rebecah was seen today for hypertension and annual exam.  Diagnoses and all orders for this visit:  Routine general medical examination at a health care facility- exam completed, labs ordered and reviewed, vaccines reviewed and updated, patient education material was given. -     Lipid panel; Future -     Comprehensive metabolic panel; Future -     CBC with Differential/Platelet; Future -     TSH; Future -     Hepatitis C antibody; Future -     HIV antibody; Future  Essential hypertension- her blood pressures adequately well-controlled, electrolytes and renal function are stable. -     Azilsartan Medoxomil (EDARBI) 40 MG TABS; Take 1 tablet by mouth daily.  Need for prophylactic vaccination and inoculation against influenza -     Flu Vaccine QUAD 36+ mos IM   I have discontinued Ms. Nitchman multivitamin. I am also having her maintain her calcium carbonate, Vitamin D3, Probiotic Product (PROBIOTIC PO), aspirin, and Azilsartan Medoxomil.  No orders of the defined types were placed in this encounter.    Follow-up: No Follow-up on file.  Scarlette Calico, MD

## 2016-03-23 ENCOUNTER — Encounter: Payer: Self-pay | Admitting: Obstetrics and Gynecology

## 2016-03-23 ENCOUNTER — Ambulatory Visit (INDEPENDENT_AMBULATORY_CARE_PROVIDER_SITE_OTHER): Payer: BLUE CROSS/BLUE SHIELD | Admitting: Obstetrics and Gynecology

## 2016-03-23 ENCOUNTER — Encounter: Payer: Self-pay | Admitting: Internal Medicine

## 2016-03-23 ENCOUNTER — Telehealth: Payer: Self-pay | Admitting: Obstetrics and Gynecology

## 2016-03-23 VITALS — BP 122/74 | HR 70 | Ht 66.5 in | Wt 125.6 lb

## 2016-03-23 DIAGNOSIS — M810 Age-related osteoporosis without current pathological fracture: Secondary | ICD-10-CM | POA: Insufficient documentation

## 2016-03-23 LAB — HIV ANTIBODY (ROUTINE TESTING W REFLEX): HIV: NONREACTIVE

## 2016-03-23 LAB — HEPATITIS C ANTIBODY: HCV Ab: NEGATIVE

## 2016-03-23 NOTE — Telephone Encounter (Signed)
Spoke with patient, advised as seen below. Patient verbalizes understanding and is agreeable.   Notes Recorded by Nunzio Cobbs, MD on 03/22/2016 at 4:26 PM EST Please inform patient of her normal breast MRI.   Due to family history, recommendation is for yearly mammogram and breast MRI.

## 2016-03-23 NOTE — Telephone Encounter (Signed)
Patient states she is returning a call to [place] but there is no open phone note. She said there were no details left with the voicemail.

## 2016-03-23 NOTE — Progress Notes (Signed)
GYNECOLOGY  VISIT   HPI: 65 y.o.   Unknown  Caucasian  female   G46P3003 with [name] was 05/16/2006.   here for bone density results.   [facility] BMD 03/08/16 R femur neck -2.7 (no comparison) L femur neck -2.8 ( was -2.4) Spine total -2.3 (was -1.1)  Not a smoker.  Not taking steroids.  No history of malabsorption. Wine - 2 beverages per day.  Exercise - swimming. Lots of walking at work. Calcium - vit D 2000 IU daily, TUMs.  Hx of fracture of right arm and wrist in 2008.   Lost footing and fell on outstretched arm.   Mother had osteoporosis.  FH of breast cancer.  Just had a normal breast MRI.   GYNECOLOGIC HISTORY: [name] was 05/16/2006. Contraception:  Postmenopausal Menopausal hormone therapy:  none Last mammogram:03-22-16 MRI Neg/BiRads2: 01-28-16 3D/Density C/Neg/BiRads1:[facility]  Last pap smear:  02-24-16 Neg:Neg HR HPV        OB History    Gravida Para Term Preterm AB Living   _0 SAB TAB Ectopic Multiple Live Births                     Patient Active Problem List   Diagnosis Date Noted  . Family history of breast cancer 01/01/2014  . Snoring 09/19/2013  . Routine general medical examination at a health care facility 09/17/2012  . Osteopenia 09/17/2012  . Hyperglycemia 12/02/2011  . Pure hypercholesterolemia 12/02/2011  . Back pain, thoracic 11/03/2011  . Essential hypertension 09/19/2008    Past Medical History:  Diagnosis Date  . Cancer ([place])    basal cell, face  . Hypertension   . Osteopenia   . Rectal bleeding     Past Surgical History:  Procedure Laterality Date  . BREAST SURGERY Left 1998   remove fibroadenoma  . KNEE ARTHROSCOPY Left   . repair broken arm  2008  . TUBAL LIGATION  1994    Current Outpatient Prescriptions  Medication Sig Dispense Refill  . aspirin 81 MG tablet Take 81 mg by mouth daily.    . Azilsartan Medoxomil (EDARBI) 40 MG TABS Take 1 tablet by  mouth daily. 30 tablet 11  . calcium carbonate (TUMS - DOSED IN MG ELEMENTAL CALCIUM) 500 MG chewable tablet Chew 1 tablet by mouth as needed.     . Cholecalciferol (VITAMIN D3) 2000 UNITS TABS Take by mouth every morning.      . Probiotic Product (PROBIOTIC PO) Take by mouth daily.      No current facility-administered medications for this visit.      ALLERGIES: Patient has no known allergies.  Family History  Problem Relation Age of Onset  . Diabetes Father   . Heart disease Father   . Hypertension Father   . Esophageal cancer Father 83  . Breast cancer Mother 68  . Hypertension Mother   . Osteoporosis Mother   . Breast cancer Sister 75    BRCA negative  . Diabetes Sister   . Breast cancer Sister 58  . Cancer Paternal Aunt     unknown form of cancer over the age of 58  . Thyroid cancer Cousin     maternal first cousin  . Breast cancer Cousin     died in her 53s; paternal cousin  . Breast cancer Cousin     paternal first cousin  . Osteoporosis Brother   . Colon  cancer Neg Hx   . Stomach cancer Neg Hx   . Rectal cancer Neg Hx     Social History   Social History  . Marital status: Unknown    Spouse name: N/A  . Number of children: 3  . Years of education: N/A   Occupational History  .  [employer]   Social History Main Topics  . Smoking status: Former Smoker    Packs/day: 0.50    Years: 5.00    Types: Cigarettes    Quit date: 12/02/1974  . Smokeless tobacco: Never Used  . Alcohol use 4.2 oz/week    7 Glasses of wine per week     Comment: occ glass of wine, drink of choice beer  . Drug use: No  . Sexual activity: Yes    Partners: Male    Birth control/ protection: Post-menopausal   Other Topics Concern  . Not on file   Social History Narrative   Daily caffeine use    ROS:  Pertinent items are noted in HPI.  PHYSICAL EXAMINATION:    BP 122/74 (BP Location: Right Arm, Patient Position: Sitting, Cuff Size: Normal)   Pulse 70   Ht 5' 6.5"  (1.689 m)   Wt 125 lb 9.6 oz (57 kg)   LMP 05/16/2006   BMI 19.97 kg/m     General appearance: alert, cooperative and appears stated age  ASSESSMENT  Osteoporosis of bilateral hips.  FH breast cancer.   PLAN  Discussion of osteoporosis - risk factors, risk of fracture and complications related to this, and treatment options.  Reviewed bisphosphonates, Evista, and Prolia.  Discussed reducing risk of fall.  Patient chooses to do weight bearing exercise, increased exercise, and repeat BMD in 2 years. We discussed recommendations from radiology for yearly MRI of breast.  Patient may choose to do this every other year.  Follow up for annual exam and prn.     An After Visit Summary was printed and given to the patient.  ___25___ minutes face to face time of which over 50% was spent in counseling.

## 2016-03-24 ENCOUNTER — Institutional Professional Consult (permissible substitution): Payer: BLUE CROSS/BLUE SHIELD | Admitting: Obstetrics and Gynecology

## 2016-03-27 MED ORDER — AZILSARTAN MEDOXOMIL 40 MG PO TABS: 1.0000 | ORAL_TABLET | Freq: Every day | ORAL | 11 refills | Status: DC

## 2016-11-24 ENCOUNTER — Ambulatory Visit (INDEPENDENT_AMBULATORY_CARE_PROVIDER_SITE_OTHER): Payer: BLUE CROSS/BLUE SHIELD | Admitting: Adult Health

## 2016-11-24 ENCOUNTER — Encounter: Payer: Self-pay | Admitting: Adult Health

## 2016-11-24 VITALS — BP 118/80 | HR 70 | Temp 98.5°F | Ht 66.5 in | Wt 126.6 lb

## 2016-11-24 DIAGNOSIS — M62838 Other muscle spasm: Secondary | ICD-10-CM

## 2016-11-24 MED ORDER — CYCLOBENZAPRINE HCL 10 MG PO TABS: 10.0000 mg | ORAL_TABLET | Freq: Three times a day (TID) | ORAL | 0 refills | Status: DC | PRN

## 2016-11-24 NOTE — Progress Notes (Signed)
Subjective:    Patient ID: Diane Day, female    DOB: March 30, 1951, 66 y.o.   MRN: [id number]  HPI  66 year old female who  has a past medical history of Cancer ([place]); Hypertension; Osteopenia; and Rectal bleeding. She is a patient of Dr. Ronnald Ramp who I am seeing for an acute issue of low back pain. She reports that since July 4th she has been suffering from a muscle spasm under her left shoulder blade. She finds relief with heating pads, stretching, and laying on the floor.   She denies any numbness or tingling down her left arm. Has not had any abdominal pain   Review of Systems See HPI   Past Medical History:  Diagnosis Date  . Cancer ([place])    basal cell, face  . Hypertension   . Osteopenia   . Rectal bleeding     Social History   Social History  . Marital status: Unknown    Spouse name: N/A  . Number of children: 3  . Years of education: N/A   Occupational History  .  [employer]   Social History Main Topics  . Smoking status: Former Smoker    Packs/day: 0.50    Years: 5.00    Types: Cigarettes    Quit date: 12/02/1974  . Smokeless tobacco: Never Used  . Alcohol use 4.2 oz/week    7 Glasses of wine per week     Comment: occ glass of wine, drink of choice beer  . Drug use: No  . Sexual activity: Yes    Partners: Male    Birth control/ protection: Post-menopausal   Other Topics Concern  . Not on file   Social History Narrative   Daily caffeine use    Past Surgical History:  Procedure Laterality Date  . BREAST SURGERY Left 1998   remove fibroadenoma  . KNEE ARTHROSCOPY Left   . repair broken arm  2008  . TUBAL LIGATION  1994    Family History  Problem Relation Age of Onset  . Diabetes Father   . Heart disease Father   . Hypertension Father   . Esophageal cancer Father 51  . Breast cancer Mother 64  . Hypertension Mother   . Osteoporosis Mother   . Breast cancer Sister 21       BRCA negative  . Diabetes Sister   . Breast cancer  Sister 6  . Cancer Paternal Aunt        unknown form of cancer over the age of 57  . Thyroid cancer Cousin        maternal first cousin  . Breast cancer Cousin        died in her 21s; paternal cousin  . Breast cancer Cousin        paternal first cousin  . Osteoporosis Brother   . Colon cancer Neg Hx   . Stomach cancer Neg Hx   . Rectal cancer Neg Hx     No Known Allergies  Current Outpatient Prescriptions on File Prior to Visit  Medication Sig Dispense Refill  . aspirin 81 MG tablet Take 81 mg by mouth daily.    . Azilsartan Medoxomil (EDARBI) 40 MG TABS Take 1 tablet by mouth daily. 30 tablet 11  . calcium carbonate (TUMS - DOSED IN MG ELEMENTAL CALCIUM) 500 MG chewable tablet Chew 1 tablet by mouth as needed.     . Cholecalciferol (VITAMIN D3) 2000 UNITS TABS Take by mouth every morning.      Marland Kitchen  Probiotic Product (PROBIOTIC PO) Take by mouth daily.      No current facility-administered medications on file prior to visit.     BP 118/80   Pulse 70   Temp 98.5 F (36.9 C) (Oral)   Ht 5' 6.5" (1.689 m)   Wt 126 lb 9.6 oz (57.4 kg)   LMP 05/16/2006   BMI 20.13 kg/m       Objective:   Physical Exam  Constitutional: She is oriented to person, place, and time. She appears well-developed and well-nourished. No distress.  Cardiovascular: Normal rate, regular rhythm, normal heart sounds and intact distal pulses.  Exam reveals no gallop and no friction rub.   No murmur heard. Pulmonary/Chest: Effort normal and breath sounds normal. No respiratory distress. She has no wheezes. She has no rales. She exhibits no tenderness.  Musculoskeletal: Normal range of motion. She exhibits tenderness. She exhibits no edema or deformity.  Pain with palpation along medial edge of left shoulder blade. Spasm ing muscle felt   Neurological: She is alert and oriented to person, place, and time.  Skin: Skin is warm and dry. No rash noted. She is not diaphoretic. No erythema. No pallor.    Psychiatric: She has a normal mood and affect. Her behavior is normal. Judgment and thought content normal.  Nursing note and vitals reviewed.      Assessment & Plan:  1. Muscle spasm - cyclobenzaprine (FLEXERIL) 10 MG tablet; Take 1 tablet (10 mg total) by mouth 3 (three) times daily as needed for muscle spasms.  Dispense: 20 tablet; Refill: 0 - Motrin 662m Q 6 H - Continue with heating pad and stretching exercises - Follow up if not resolved in 2-3 days   CDorothyann Peng NP

## 2016-12-22 ENCOUNTER — Telehealth: Payer: Self-pay | Admitting: Internal Medicine

## 2016-12-22 NOTE — Telephone Encounter (Signed)
Pt called yesterday while phone systems were down, calling for an appt for a appointment for a skin breakout  LVM notifying systems are back up and to call back

## 2017-03-01 ENCOUNTER — Ambulatory Visit: Payer: BLUE CROSS/BLUE SHIELD | Admitting: Obstetrics and Gynecology

## 2017-03-01 ENCOUNTER — Encounter: Payer: Self-pay | Admitting: Obstetrics and Gynecology

## 2017-03-01 VITALS — BP 122/64 | HR 84 | Resp 16 | Ht 67.0 in | Wt 129.0 lb

## 2017-03-01 DIAGNOSIS — Z01419 Encounter for gynecological examination (general) (routine) without abnormal findings: Secondary | ICD-10-CM

## 2017-03-01 DIAGNOSIS — M810 Age-related osteoporosis without current pathological fracture: Secondary | ICD-10-CM | POA: Diagnosis not present

## 2017-03-01 MED ORDER — RALOXIFENE HCL 60 MG PO TABS: 60.0000 mg | ORAL_TABLET | Freq: Every day | ORAL | 2 refills | Status: DC

## 2017-03-01 NOTE — Patient Instructions (Signed)
EXERCISE AND DIET:  We recommended that you start or continue a regular exercise program for good health. Regular exercise means any activity that makes your heart beat faster and makes you sweat.  We recommend exercising at least 30 minutes per day at least 3 days a week, preferably 4 or 5.  We also recommend a diet low in fat and sugar.  Inactivity, poor dietary choices and obesity can cause diabetes, heart attack, stroke, and kidney damage, among others.    ALCOHOL AND SMOKING:  Women should limit their alcohol intake to no more than 7 drinks/beers/glasses of wine (combined, not each!) per week. Moderation of alcohol intake to this level decreases your risk of breast cancer and liver damage. And of course, no recreational drugs are part of a healthy lifestyle.  And absolutely no smoking or even second hand smoke. Most people know smoking can cause heart and lung diseases, but did you know it also contributes to weakening of your bones? Aging of your skin?  Yellowing of your teeth and nails?  CALCIUM AND VITAMIN D:  Adequate intake of calcium and Vitamin D are recommended.  The recommendations for exact amounts of these supplements seem to change often, but generally speaking 600 mg of calcium (either carbonate or citrate) and 800 units of Vitamin D per day seems prudent. Certain women may benefit from higher intake of Vitamin D.  If you are among these women, your doctor will have told you during your visit.    PAP SMEARS:  Pap smears, to check for cervical cancer or precancers,  have traditionally been done yearly, although recent scientific advances have shown that most women can have pap smears less often.  However, every woman still should have a physical exam from her gynecologist every year. It will include a breast check, inspection of the vulva and vagina to check for abnormal growths or skin changes, a visual exam of the cervix, and then an exam to evaluate the size and shape of the uterus and  ovaries.  And after 64 years of age, a rectal exam is indicated to check for rectal cancers. We will also provide age appropriate advice regarding health maintenance, like when you should have certain vaccines, screening for sexually transmitted diseases, bone density testing, colonoscopy, mammograms, etc.   MAMMOGRAMS:  All women over 40 years old should have a yearly mammogram. Many facilities now offer a "3D" mammogram, which may cost around $50 extra out of pocket. If possible,  we recommend you accept the option to have the 3D mammogram performed.  It both reduces the number of women who will be called back for extra views which then turn out to be normal, and it is better than the routine mammogram at detecting truly abnormal areas.    COLONOSCOPY:  Colonoscopy to screen for colon cancer is recommended for all women at age 50.  We know, you hate the idea of the prep.  We agree, BUT, having colon cancer and not knowing it is worse!!  Colon cancer so often starts as a polyp that can be seen and removed at colonscopy, which can quite literally save your life!  And if your first colonoscopy is normal and you have no family history of colon cancer, most women don't have to have it again for 10 years.  Once every ten years, you can do something that may end up saving your life, right?  We will be happy to help you get it scheduled when you are ready.    Be sure to check your insurance coverage so you understand how much it will cost.  It may be covered as a preventative service at no cost, but you should check your particular policy.     Raloxifene tablets What is this medicine? RALOXIFENE (ral OX i feen) reduces the amount of calcium lost from bones. It is used to treat and prevent osteoporosis in women who have experienced menopause. It may also help prevent invasive breast cancer in certain women who have a high risk for breast cancer. This medicine may be used for other purposes; ask your health care  provider or pharmacist if you have questions. COMMON BRAND NAME(S): Evista What should I tell my health care provider before I take this medicine? They need to know if you have any of these conditions: -a history of blood clots -cancer -heart disease or recent heart attack -high levels of triglycerides (blood fat) in the blood -history of stroke -kidney disease -liver disease -premenopausal -smoke tobacco -an unusual or allergic reaction to raloxifene, other medicines, foods, dyes, or preservatives -pregnant or trying to get pregnant -breast-feeding How should I use this medicine? Take this medicine by mouth with a glass of water. Follow the directions on the prescription label. The tablets can be taken with or without food. Take your doses at regular intervals. Do not take your medicine more often than directed. A special MedGuide will be given to you by the pharmacist with each prescription and refill. Be sure to read this information carefully each time. Talk to your pediatrician regarding the use of this medicine in children. Special care may be needed. Overdosage: If you think you have taken too much of this medicine contact a poison control center or emergency room at once. NOTE: This medicine is only for you. Do not share this medicine with others. What if I miss a dose? If you miss a dose, take it as soon as you can. If it is almost time for your next dose, take only that dose. Do not take double or extra doses. What may interact with this medicine? -cholestyramine -female hormones, like estrogens -warfarin This list may not describe all possible interactions. Give your health care provider a list of all the medicines, herbs, non-prescription drugs, or dietary supplements you use. Also tell them if you smoke, drink alcohol, or use illegal drugs. Some items may interact with your medicine. What should I watch for while using this medicine? Visit your doctor or health care  professional for regular checks on your progress. Do not stop taking this medicine except on the advice of your doctor or health care professional. If you are taking this medicine to reduce your risk of getting breast cancer, you should know that this medicine does not prevent all types of breast cancer. Talk to your doctor if you have questions. This medicine does not prevent hot flashes. It may cause hot flashes in some patients at the start of therapy. You should make sure that you get enough calcium and vitamin D while you are taking this medicine. Discuss the foods you eat and the vitamins you take with your health care professional. Exercise may help to prevent bone loss. Discuss your exercise needs with your doctor or health care professional. This medicine can rarely cause blood clots. If you are going to have surgery, tell your doctor or health care professional that you are taking this medicine. This medicine should be stopped at least 3 days before surgery. After surgery, it should be  restarted only after you are walking again. It should not be restarted while you still need long periods of bed rest. You should not smoke while taking this medicine. Smoking may increase your risk of blood clots or stroke. If you have any reason to think you are pregnant; stop taking this medicine at once and contact your doctor or health care professional. Do not breast feed while taking this medicine. What side effects may I notice from receiving this medicine? Side effects that you should report to your doctor or health care professional as soon as possible: -allergic reactions like skin rash, itching or hives, swelling of the face, lips, or tongue) -breast tissue changes or discharge -signs and symptoms of a blood clot such as breathing problems; changes in vision; chest pain; severe, sudden headache; pain, swelling, warmth in the leg; trouble speaking; sudden numbness or weakness of the face, arm or  leg -signs and symptoms of a stroke like changes in vision; confusion; trouble speaking or understanding; severe headaches; sudden numbness or weakness of the face, arm or leg; trouble walking; dizziness; loss of balance or coordination -vaginal discharge that is bloody, brown, or rust Side effects that usually do not require medical attention (report to your doctor or health care professional if they continue or are bothersome): -hot flashes -joint pain -leg cramps -sweating -swelling of the ankles, feet, hands This list may not describe all possible side effects. Call your doctor for medical advice about side effects. You may report side effects to FDA at 1-800-FDA-1088. Where should I keep my medicine? Keep out of the reach of children. Store at room temperature between 15 and 30 degrees C (59 and 86 degrees F). Throw away any unused medicine after the expiration date. NOTE: This sheet is a summary. It may not cover all possible information. If you have questions about this medicine, talk to your doctor, pharmacist, or health care provider.  2018 Elsevier/Gold Standard (2016-06-08 17:15:34)

## 2017-03-01 NOTE — Progress Notes (Signed)
66 y.o. G19P3003 Unknown Caucasian female here for annual exam.    Strong FH of breast cancer.  One sister did genetic testing and it was negative.  Patient had genetic counseling but did not do testing.   Felt very anxious with the breast MRI she did.  She felt claustrophobic and declines doing another breast MRI at this time.  Labs with PCP.   PCP:   Dr. Scarlette Calico  [name] was 05/16/2006.           Sexually active: Yes.    [name]    Exercising: Yes.    swimming, walking, and weights Smoker:  no  Health Maintenance: Pap: 02/14/16 Neg:Neg HR HPV  11/30/12 ASCUS. HR HPV: Neg History of abnormal Pap:  yes MMG:  02/14/17 BIRADS 1 negative/density c.  Normal breast MRI 03/22/16. Colonoscopy:  08/29/12 Mild diverticulosis. Repeat 10 years  BMD: 03/08/16    Result  Osteoporosis.  Observational management.  TDaP:  2014. HIV and Hep C: 03/22/16 Negative Screening Labs: PCP   reports that she quit smoking about 42 years ago. Her smoking use included Cigarettes. She has a 2.50 pack-year smoking history. She has never used smokeless tobacco. She reports that she drinks about 4.2 oz of alcohol per week . She reports that she does not use drugs.  Past Medical History:  Diagnosis Date  . Cancer ([place])    basal cell, face  . Hypertension   . Osteopenia   . Osteoporosis   . Rectal bleeding     Past Surgical History:  Procedure Laterality Date  . BREAST SURGERY Left 1998   remove fibroadenoma  . KNEE ARTHROSCOPY Left   . repair broken arm  2008  . TUBAL LIGATION  1994    Current Outpatient Prescriptions  Medication Sig Dispense Refill  . aspirin 81 MG tablet Take 81 mg by mouth daily.    . Azilsartan Medoxomil (EDARBI) 40 MG TABS Take 1 tablet by mouth daily. 30 tablet 11  . calcium carbonate (TUMS - DOSED IN MG ELEMENTAL CALCIUM) 500 MG chewable tablet Chew 1 tablet by mouth as needed.     .  Cholecalciferol (VITAMIN D3) 2000 UNITS TABS Take by mouth every morning.      . Multiple Vitamin (MULTIVITAMIN) tablet Take 1 tablet by mouth daily.    . Probiotic Product (PROBIOTIC PO) Take by mouth daily.      No current facility-administered medications for this visit.     Family History  Problem Relation Age of Onset  . Diabetes Father   . Heart disease Father   . Hypertension Father   . Esophageal cancer Father 69  . Breast cancer Mother 19  . Hypertension Mother   . Osteoporosis Mother   . Breast cancer Sister 43       BRCA negative  . Diabetes Sister   . Breast cancer Sister 59  . Cancer Paternal Aunt        unknown form of cancer over the age of 83  . Thyroid cancer Cousin        maternal first cousin  . Breast cancer Cousin        died in her 7s; paternal cousin  . Breast cancer Cousin        paternal first cousin  . Osteoporosis Brother   . Colon cancer Neg Hx   . Stomach cancer Neg Hx   . Rectal cancer Neg Hx  ROS:  Pertinent items are noted in HPI.  Otherwise, a comprehensive ROS was negative.  Exam:   BP 122/64 (BP Location: Right Arm, Patient Position: Sitting, Cuff Size: Normal)   Pulse 84   Resp 16   Ht 5' 7"  (1.702 m)   Wt 129 lb (58.5 kg)   LMP 05/16/2006   BMI 20.20 kg/m     General appearance: alert, cooperative and appears stated age Head: Normocephalic, without obvious abnormality, atraumatic Neck: no adenopathy, supple, symmetrical, trachea midline and thyroid normal to inspection and palpation Lungs: clear to auscultation bilaterally Breasts: normal appearance, no masses or tenderness, No nipple retraction or dimpling, No nipple discharge or bleeding, No axillary or supraclavicular adenopathy Heart: regular rate and rhythm Abdomen: soft, non-tender; no masses, no organomegaly Extremities: extremities normal, atraumatic, no cyanosis or edema Skin: Skin color, texture, turgor normal. No rashes or lesions Lymph nodes: Cervical,  supraclavicular, and axillary nodes normal. No abnormal inguinal nodes palpated Neurologic: Grossly normal  Pelvic: External genitalia:  no lesions              Urethra:  normal appearing urethra with no masses, tenderness or lesions              Bartholins and Skenes: normal                 Vagina: normal appearing vagina with normal color and discharge, no lesions              Cervix: no lesions              Pap taken: No. Bimanual Exam:  Uterus:  normal size, contour, position, consistency, mobility, non-tender              Adnexa: no mass, fullness, tenderness              Rectal exam: Yes.  .  Confirms.              Anus:  normal sphincter tone, no lesions  Chaperone was present for exam.  Assessment:   Well woman visit with normal exam. FH breast cancer.  Osteoporosis.  Plan: Mammogram screening discussed.  She will do 3D yearly.  Recommended self breast awareness. Pap and HR HPV as above. Guidelines for Calcium, Vitamin D, regular exercise program including cardiovascular and weight bearing exercise. We reviewed Evista for reduction of breast cancer risk and tx of osteoporosis.  I reviewed side effects as well as risk of fatal stroke.  I have prescribed 3 months of Evista and she will return in 6 - 8 weeks for a recheck if she ultimately takes it.  She wants to do a check on cost at her pharmacy. BMD in one year.  Labs with PCP.  Follow up annually and prn.    After visit summary provided.

## 2017-03-08 ENCOUNTER — Encounter: Payer: Self-pay | Admitting: Obstetrics and Gynecology

## 2017-03-23 ENCOUNTER — Encounter: Payer: BLUE CROSS/BLUE SHIELD | Admitting: Internal Medicine

## 2017-03-29 ENCOUNTER — Ambulatory Visit (INDEPENDENT_AMBULATORY_CARE_PROVIDER_SITE_OTHER): Payer: BLUE CROSS/BLUE SHIELD | Admitting: Internal Medicine

## 2017-03-29 ENCOUNTER — Other Ambulatory Visit: Payer: BLUE CROSS/BLUE SHIELD

## 2017-03-29 ENCOUNTER — Encounter: Payer: Self-pay | Admitting: Internal Medicine

## 2017-03-29 VITALS — BP 124/84 | HR 68 | Temp 97.6°F | Resp 16 | Ht 66.5 in | Wt 129.1 lb

## 2017-03-29 DIAGNOSIS — R739 Hyperglycemia, unspecified: Secondary | ICD-10-CM | POA: Diagnosis not present

## 2017-03-29 DIAGNOSIS — Z0000 Encounter for general adult medical examination without abnormal findings: Secondary | ICD-10-CM

## 2017-03-29 DIAGNOSIS — Z23 Encounter for immunization: Secondary | ICD-10-CM

## 2017-03-29 DIAGNOSIS — E7800 Pure hypercholesterolemia, unspecified: Secondary | ICD-10-CM

## 2017-03-29 DIAGNOSIS — I10 Essential (primary) hypertension: Secondary | ICD-10-CM | POA: Diagnosis not present

## 2017-03-29 NOTE — Progress Notes (Signed)
Subjective:  Patient ID: Diane Day, female    DOB: 07/21/1950  Age: 66 y.o. MRN: [id number]  CC: Annual Exam and Hypertension   HPI JAMIEE MILHOLLAND presents for a CPX.  She tells me her blood pressure has been well controlled.  She is very active with a swimming routine and denies chest pain, shortness of breath, or fatigue.  Outpatient Medications Prior to Visit  Medication Sig Dispense Refill  . aspirin 81 MG tablet Take 81 mg by mouth daily.    . Azilsartan Medoxomil (EDARBI) 40 MG TABS Take 1 tablet by mouth daily. 30 tablet 11  . calcium carbonate (TUMS - DOSED IN MG ELEMENTAL CALCIUM) 500 MG chewable tablet Chew 1 tablet by mouth as needed.     . Cholecalciferol (VITAMIN D3) 2000 UNITS TABS Take by mouth every morning.      . Multiple Vitamin (MULTIVITAMIN) tablet Take 1 tablet by mouth daily.    . Probiotic Product (PROBIOTIC PO) Take by mouth daily.     . raloxifene (EVISTA) 60 MG tablet Take 1 tablet (60 mg total) by mouth daily. (Patient not taking: Reported on 03/29/2017) 30 tablet 2   No facility-administered medications prior to visit.     ROS Review of Systems  Constitutional: Negative for appetite change, diaphoresis, fatigue and unexpected weight change.  HENT: Negative.  Negative for sinus pressure and trouble swallowing.   Eyes: Negative for visual disturbance.  Respiratory: Negative for cough, chest tightness, shortness of breath and wheezing.   Cardiovascular: Negative.  Negative for chest pain, palpitations and leg swelling.  Gastrointestinal: Negative for constipation and diarrhea.  Endocrine: Negative.   Genitourinary: Negative.  Negative for difficulty urinating.  Musculoskeletal: Negative.  Negative for back pain, myalgias and neck pain.  Skin: Negative.   Allergic/Immunologic: Negative.   Neurological: Negative.  Negative for dizziness, weakness and light-headedness.  Hematological: Negative for adenopathy. Does not bruise/bleed easily.    Psychiatric/Behavioral: Negative.     Objective:  BP 124/84 (BP Location: Right Arm, Patient Position: Sitting, Cuff Size: Normal)   Pulse 68   Temp 97.6 F (36.4 C) (Oral)   Resp 16   Ht 5' 6.5" (1.689 m)   Wt 129 lb 1.3 oz (58.6 kg)   LMP 05/16/2006   SpO2 99%   BMI 20.52 kg/m   BP Readings from Last 3 Encounters:  03/29/17 124/84  03/01/17 122/64  11/24/16 118/80    Wt Readings from Last 3 Encounters:  03/29/17 129 lb 1.3 oz (58.6 kg)  03/01/17 129 lb (58.5 kg)  11/24/16 126 lb 9.6 oz (57.4 kg)    Physical Exam  Constitutional: She is oriented to person, place, and time. No distress.  HENT:  Mouth/Throat: Oropharynx is clear and moist. No oropharyngeal exudate.  Eyes: Conjunctivae are normal. Right eye exhibits no discharge. Left eye exhibits no discharge. No scleral icterus.  Neck: Normal range of motion. Neck supple. No JVD present. No thyromegaly present.  Cardiovascular: Normal rate, regular rhythm and intact distal pulses. Exam reveals no gallop and no friction rub.  No murmur heard. Pulmonary/Chest: Effort normal and breath sounds normal. No respiratory distress. She has no wheezes. She has no rales. She exhibits no tenderness.  Abdominal: Soft. Bowel sounds are normal. She exhibits no distension and no mass. There is no tenderness. There is no rebound and no guarding.  Musculoskeletal: Normal range of motion. She exhibits no edema, tenderness or deformity.  Lymphadenopathy:    She has no cervical adenopathy.  Neurological: She is alert and oriented to person, place, and time.  Skin: Skin is warm and dry. No rash noted. She is not diaphoretic. No erythema. No pallor.  Psychiatric: She has a normal mood and affect. Her behavior is normal. Judgment and thought content normal.  Vitals reviewed.   Lab Results  Component Value Date   WBC 7.9 03/22/2016   HGB 14.1 03/22/2016   HCT 41.8 03/22/2016   PLT 226.0 03/22/2016   GLUCOSE 92 03/22/2016   CHOL 198  03/22/2016   TRIG 55.0 03/22/2016   HDL 90.60 03/22/2016   LDLCALC 96 03/22/2016   ALT 17 03/22/2016   AST 19 03/22/2016   NA 142 03/22/2016   K 4.8 03/22/2016   CL 106 03/22/2016   CREATININE 0.82 03/22/2016   BUN 9 03/22/2016   CO2 30 03/22/2016   TSH 1.16 03/22/2016   HGBA1C 5.6 11/20/2014    Mr Breast Bilateral W Wo Contrast  Result Date: 03/22/2016 CLINICAL DATA:  Strong family history of breast cancer LABS:  Creatinine 0.8, GFR 73 EXAM: BILATERAL BREAST MRI WITH AND WITHOUT CONTRAST TECHNIQUE: Multiplanar, multisequence MR images of both breasts were obtained prior to and following the intravenous administration of 11 ml of MultiHance. THREE-DIMENSIONAL MR IMAGE RENDERING ON INDEPENDENT WORKSTATION: Three-dimensional MR images were rendered by post-processing of the original MR data on an independent workstation. The three-dimensional MR images were interpreted, and findings are reported in the following complete MRI report for this study. Three dimensional images were evaluated at the independent DynaCad workstation COMPARISON:  Previous exam(s). FINDINGS: Breast composition: b. Scattered fibroglandular tissue. Background parenchymal enhancement: Mild Right breast: No mass or abnormal enhancement. Left breast: No mass or abnormal enhancement. Lymph nodes: No abnormal appearing lymph nodes. Ancillary findings:  None. IMPRESSION: No MRI evidence of malignancy. RECOMMENDATION: Annual mammography and breast MRI for further surveillance. BI-RADS CATEGORY  2: Benign. Electronically Signed   By: Dorise Bullion III M.D   On: 03/22/2016 14:49    Assessment & Plan:   Kaja was seen today for annual exam and hypertension.  Diagnoses and all orders for this visit:  Need for influenza vaccination -     Flu Vaccine QUAD 6+ mos PF IM (Fluarix Quad PF)  Essential hypertension- Her blood pressure is well controlled.  Electrolytes and renal function are normal. -     Comprehensive metabolic  panel; Future -     CBC with Differential/Platelet; Future  Pure hypercholesterolemia- She has a low ASCVD risk score so I do not recommend that she take a statin for CV risk reduction. -     TSH; Future -     Urinalysis, Routine w reflex microscopic; Future  Hyperglycemia- Her blood sugar is normal now. -     Comprehensive metabolic panel; Future  Routine general medical examination at a health care facility- Exam completed, labs reviewed, vaccines reviewed and updated, screening for colon cancer is up-to-date, mammogram and Pap smear are up-to-date, patient education material was given. -     Lipid panel; Future   I am having Diane Day "Kathlee Nations" maintain her calcium carbonate, Vitamin D3, Probiotic Product (PROBIOTIC PO), aspirin, Azilsartan Medoxomil, multivitamin, and raloxifene.  No orders of the defined types were placed in this encounter.    Follow-up: Return in about 6 months (around 09/26/2017).  Scarlette Calico, MD

## 2017-03-29 NOTE — Patient Instructions (Signed)

## 2017-04-05 ENCOUNTER — Other Ambulatory Visit: Payer: Self-pay | Admitting: Internal Medicine

## 2017-04-05 DIAGNOSIS — I10 Essential (primary) hypertension: Secondary | ICD-10-CM

## 2017-04-11 ENCOUNTER — Other Ambulatory Visit (INDEPENDENT_AMBULATORY_CARE_PROVIDER_SITE_OTHER): Payer: BLUE CROSS/BLUE SHIELD

## 2017-04-11 DIAGNOSIS — E7800 Pure hypercholesterolemia, unspecified: Secondary | ICD-10-CM | POA: Diagnosis not present

## 2017-04-11 DIAGNOSIS — I10 Essential (primary) hypertension: Secondary | ICD-10-CM

## 2017-04-11 DIAGNOSIS — R739 Hyperglycemia, unspecified: Secondary | ICD-10-CM | POA: Diagnosis not present

## 2017-04-11 DIAGNOSIS — Z0000 Encounter for general adult medical examination without abnormal findings: Secondary | ICD-10-CM

## 2017-04-11 LAB — CBC WITH DIFFERENTIAL/PLATELET
BASOS ABS: 0.1 10*3/uL (ref 0.0–0.1)
Basophils Relative: 1.7 % (ref 0.0–3.0)
Eosinophils Absolute: 0 10*3/uL (ref 0.0–0.7)
Eosinophils Relative: 1 % (ref 0.0–5.0)
HCT: 43.9 % (ref 36.0–46.0)
Hemoglobin: 14.5 g/dL (ref 12.0–15.0)
LYMPHS ABS: 1.9 10*3/uL (ref 0.7–4.0)
LYMPHS PCT: 41.1 % (ref 12.0–46.0)
MCHC: 33 g/dL (ref 30.0–36.0)
MCV: 92.4 fl (ref 78.0–100.0)
MONOS PCT: 9.3 % (ref 3.0–12.0)
Monocytes Absolute: 0.4 10*3/uL (ref 0.1–1.0)
NEUTROS PCT: 46.9 % (ref 43.0–77.0)
Neutro Abs: 2.2 10*3/uL (ref 1.4–7.7)
Platelets: 221 10*3/uL (ref 150.0–400.0)
RBC: 4.76 Mil/uL (ref 3.87–5.11)
RDW: 13.4 % (ref 11.5–15.5)
WBC: 4.6 10*3/uL (ref 4.0–10.5)

## 2017-04-11 LAB — COMPREHENSIVE METABOLIC PANEL
ALK PHOS: 57 U/L (ref 39–117)
ALT: 14 U/L (ref 0–35)
AST: 17 U/L (ref 0–37)
Albumin: 4.3 g/dL (ref 3.5–5.2)
BILIRUBIN TOTAL: 1.1 mg/dL (ref 0.2–1.2)
BUN: 15 mg/dL (ref 6–23)
CO2: 27 mEq/L (ref 19–32)
CREATININE: 0.85 mg/dL (ref 0.40–1.20)
Calcium: 9.3 mg/dL (ref 8.4–10.5)
Chloride: 107 mEq/L (ref 96–112)
GFR: 71.57 mL/min (ref >60.00)
GLUCOSE: 100 mg/dL — AB (ref 70–99)
Potassium: 4.3 mEq/L (ref 3.5–5.1)
Sodium: 142 mEq/L (ref 135–145)
TOTAL PROTEIN: 6.2 g/dL (ref 6.0–8.3)

## 2017-04-11 LAB — URINALYSIS, ROUTINE W REFLEX MICROSCOPIC
BILIRUBIN URINE: NEGATIVE
HGB URINE DIPSTICK: NEGATIVE
Ketones, ur: NEGATIVE
NITRITE: NEGATIVE
RBC / HPF: NONE SEEN (ref 0–?)
Specific Gravity, Urine: 1.02 (ref 1.000–1.030)
TOTAL PROTEIN, URINE-UPE24: NEGATIVE
URINE GLUCOSE: NEGATIVE
Urobilinogen, UA: 0.2 (ref 0.0–1.0)
pH: 7 (ref 5.0–8.0)

## 2017-04-11 LAB — LIPID PANEL
CHOL/HDL RATIO: 2
Cholesterol: 198 mg/dL (ref 0–200)
HDL: 85.3 mg/dL (ref >39.00)
LDL Cholesterol: 102 mg/dL — ABNORMAL HIGH (ref 0–99)
NONHDL: 112.2
Triglycerides: 53 mg/dL (ref 0.0–149.0)
VLDL: 10.6 mg/dL (ref 0.0–40.0)

## 2017-04-11 LAB — TSH: TSH: 1.48 u[IU]/mL (ref 0.35–4.50)

## 2017-04-12 ENCOUNTER — Encounter: Payer: Self-pay | Admitting: Internal Medicine

## 2017-04-12 ENCOUNTER — Ambulatory Visit: Payer: BLUE CROSS/BLUE SHIELD | Admitting: Obstetrics and Gynecology

## 2017-05-17 ENCOUNTER — Ambulatory Visit: Payer: BLUE CROSS/BLUE SHIELD | Admitting: Obstetrics and Gynecology

## 2017-06-02 ENCOUNTER — Telehealth: Payer: Self-pay | Admitting: Obstetrics and Gynecology

## 2017-06-02 NOTE — Telephone Encounter (Signed)
Left message on voicemail regarding cancelled appointment for 06/07/17.

## 2017-06-07 ENCOUNTER — Ambulatory Visit: Payer: BLUE CROSS/BLUE SHIELD | Admitting: Obstetrics and Gynecology

## 2017-06-08 NOTE — Progress Notes (Signed)
GYNECOLOGY  VISIT   HPI: 66 y.o.   Unknown  Caucasian  female   [id number] with [name] was 05/16/2006.   here for 3 month follow up after starting Evista for osteoporosis.   Calcium 9.3 on 04/11/17.  Having some sinus type headaches at night.  No headache.  Taking ibuprofen to treat symptoms.  Personal hx of HTN controlled on medication.  Has low cholesterol.   Strong FH of breast cancer.  New grand daughter born on 05/16/17.  Another daughter is pregnant as well.  GYNECOLOGIC HISTORY: [name] was 05/16/2006. Contraception:  Postmenopausal Menopausal hormone therapy: none Last mammogram:  02/14/17 BIRADS 1 negative/density c.  Normal breast MRI 03/22/16.  Last pap smear:  02/14/16 Neg:Neg HR HPV             11/30/12 ASCUS. HR HPV: Neg        OB History    Gravida Para Term Preterm AB Living   3 3 3     3   SAB TAB Ectopic Multiple Live Births                     Patient Active Problem List   Diagnosis Date Noted  . Osteoporosis without current pathological fracture 03/23/2016  . Family history of breast cancer 01/01/2014  . Snoring 09/19/2013  . Routine general medical examination at a health care facility 09/17/2012  . Hyperglycemia 12/02/2011  . Pure hypercholesterolemia 12/02/2011  . Back pain, thoracic 11/03/2011  . Essential hypertension 09/19/2008    Past Medical History:  Diagnosis Date  . Cancer ([facility])    basal cell, face  . Hypertension   . Osteopenia   . Osteoporosis   . Rectal bleeding     Past Surgical History:  Procedure Laterality Date  . BREAST SURGERY Left 1998   remove fibroadenoma  . KNEE ARTHROSCOPY Left   . repair broken arm  2008  . TUBAL LIGATION  1994    Current Outpatient Medications  Medication Sig Dispense Refill  . aspirin 81 MG tablet Take 81 mg by mouth daily.    . calcium carbonate (TUMS - DOSED IN MG ELEMENTAL CALCIUM) 500 MG chewable tablet Chew 1 tablet by mouth as needed.      . Cholecalciferol (VITAMIN D3) 2000 UNITS TABS Take by mouth every morning.      . EDARBI 40 MG TABS TAKE 1 TABLET EACH DAY. 30 tablet 11  . Multiple Vitamin (MULTIVITAMIN) tablet Take 1 tablet by mouth daily.    . Probiotic Product (PROBIOTIC PO) Take by mouth daily.     . raloxifene (EVISTA) 60 MG tablet Take 1 tablet (60 mg total) by mouth daily. 30 tablet 8   No current facility-administered medications for this visit.      ALLERGIES: Patient has no known allergies.  Family History  Problem Relation Age of Onset  . Diabetes Father   . Heart disease Father   . Hypertension Father   . Esophageal cancer Father 81  . Breast cancer Mother 87  . Hypertension Mother   . Osteoporosis Mother   . Breast cancer Sister 60       BRCA negative  . Diabetes Sister   . Breast cancer Sister 57  . Cancer Paternal Aunt        unknown form of cancer over the age of 50  . Thyroid cancer Cousin        maternal first cousin  . Breast   cancer Cousin        died in her 39s; paternal cousin  . Breast cancer Cousin        paternal first cousin  . Osteoporosis Brother   . Colon cancer Neg Hx   . Stomach cancer Neg Hx   . Rectal cancer Neg Hx     Social History   Socioeconomic History  . Marital status: Unknown    Spouse name: Not on file  . Number of children: 3  . Years of education: Not on file  . Highest education level: Not on file  Social Needs  . Financial resource strain: Not on file  . Food insecurity - worry: Not on file  . Food insecurity - inability: Not on file  . Transportation needs - medical: Not on file  . Transportation needs - non-medical: Not on file  Occupational History    Employer: [employer]  Tobacco Use  . Smoking status: Former Smoker    Packs/day: 0.50    Years: 5.00    Pack years: 2.50    Types: Cigarettes    Last attempt to quit: 12/02/1974    Years since quitting: 42.5  . Smokeless tobacco: Never Used  Substance and Sexual Activity  . Alcohol  use: Yes    Alcohol/week: 4.2 oz    Types: 7 Glasses of wine per week    Comment: occ glass of wine, drink of choice beer  . Drug use: No  . Sexual activity: Yes    Partners: Male    Birth control/protection: Post-menopausal  Other Topics Concern  . Not on file  Social History Narrative   Daily caffeine use    ROS:  Pertinent items are noted in HPI.  PHYSICAL EXAMINATION:    BP 116/62 (BP Location: Right Arm, Patient Position: Sitting, Cuff Size: Normal)   Pulse 76   Resp 14   Wt 128 lb (58.1 kg)   LMP 05/16/2006   BMI 20.35 kg/m     General appearance: alert, cooperative and appears stated age  ASSESSMENT  Osteoporosis. On Evista.  FH breast cancer. HTN well controlled on medication.   PLAN  We reviewed Evista risks and benefits again. We did discuss risk of thromboembolic events including fatal stroke and retinal thrombosis.  Patient wishes to continue Evista.  Refills given until annual exam is due.  Plan for BMD at end of 2019.   An After Visit Summary was printed and given to the patient.  __15____ minutes face to face time of which over 50% was spent in counseling.

## 2017-06-12 ENCOUNTER — Encounter: Payer: Self-pay | Admitting: Obstetrics and Gynecology

## 2017-06-12 ENCOUNTER — Ambulatory Visit: Payer: BLUE CROSS/BLUE SHIELD | Admitting: Obstetrics and Gynecology

## 2017-06-12 ENCOUNTER — Other Ambulatory Visit: Payer: Self-pay

## 2017-06-12 VITALS — BP 116/62 | HR 76 | Resp 14 | Wt 128.0 lb

## 2017-06-12 DIAGNOSIS — M810 Age-related osteoporosis without current pathological fracture: Secondary | ICD-10-CM

## 2017-06-12 MED ORDER — RALOXIFENE HCL 60 MG PO TABS: 60.0000 mg | ORAL_TABLET | Freq: Every day | ORAL | 8 refills | Status: DC

## 2017-08-07 ENCOUNTER — Telehealth: Payer: Self-pay | Admitting: Internal Medicine

## 2017-08-07 NOTE — Telephone Encounter (Signed)
Copied from [place] [id number]. Topic: Quick Communication - See Telephone Encounter >> Aug 07, 2017  8:41 AM Ahmed Prima L wrote: CRM for notification. See Telephone encounter for: 08/07/17.  Patient states that she has switched insurances and her EDARBI 40 MG TABS is going to be $50 a month now. She wants to know if Dr Ronnald Ramp could look and see if there is something else that she could take in place that is cheaper? Please advise

## 2017-08-11 ENCOUNTER — Ambulatory Visit: Payer: Self-pay | Admitting: *Deleted

## 2017-08-11 NOTE — Telephone Encounter (Signed)
Pt is returning call - she thinks it is about this medication please call back

## 2017-08-11 NOTE — Telephone Encounter (Signed)
Patient is returning the call from [place]- she is upset that she has not heard back in a more timely manner. She is going to talk to the pharmacist about her options- she thinks there should be a cheaper BP medication alternative for her to take. She is headed to work now and will not be able to talk soon.

## 2017-08-11 NOTE — Telephone Encounter (Signed)
Is there an alternative to [place] that would be less expensive for patient?   I did leave a message for pt to see if she is interested in copay cards.

## 2017-08-12 ENCOUNTER — Other Ambulatory Visit: Payer: Self-pay | Admitting: Internal Medicine

## 2017-08-12 DIAGNOSIS — I10 Essential (primary) hypertension: Secondary | ICD-10-CM

## 2017-08-12 MED ORDER — TELMISARTAN 40 MG PO TABS: 40.0000 mg | ORAL_TABLET | Freq: Every day | ORAL | 1 refills | Status: DC

## 2017-08-13 NOTE — Telephone Encounter (Signed)
New RX sent

## 2017-08-14 NOTE — Telephone Encounter (Signed)
Left detailed message for pt and informed that new rx was sent in.

## 2017-10-10 ENCOUNTER — Telehealth: Payer: Self-pay | Admitting: Obstetrics and Gynecology

## 2017-10-10 NOTE — Telephone Encounter (Signed)
How about appointment at 8:15 or 8:45 as a work in?

## 2017-10-10 NOTE — Telephone Encounter (Signed)
Spoke with patient. Patient states that on 5/24 she noticed a sore on the inside of her thigh because it was bleeding. Reports it appear to be a thickening of the skin. Not currently bleeding. Denies any pain, fever, or chills. Patient is unable to see the area well. Requesting to see Dr.Silva for further evaluation tomorrow. Declines appointment today with another MD. Declines appointment for Thursday. Advised will review with Dr.Silva and return call.  Dr.Silva please advise scheduling. No open slots for tomorrow 5/29.

## 2017-10-10 NOTE — Telephone Encounter (Signed)
Patient calling requesting an appointment with Dr.Silva for a "sore on in the inside of her leg that has been bleeding".

## 2017-10-11 ENCOUNTER — Ambulatory Visit: Payer: BLUE CROSS/BLUE SHIELD | Admitting: Obstetrics and Gynecology

## 2017-10-11 ENCOUNTER — Other Ambulatory Visit: Payer: Self-pay

## 2017-10-11 ENCOUNTER — Encounter: Payer: Self-pay | Admitting: Obstetrics and Gynecology

## 2017-10-11 VITALS — BP 126/80 | HR 68 | Resp 16 | Ht 67.0 in | Wt 124.0 lb

## 2017-10-11 DIAGNOSIS — L739 Follicular disorder, unspecified: Secondary | ICD-10-CM

## 2017-10-11 NOTE — Telephone Encounter (Signed)
Spoke with patient. Appointment scheduled for today at 8:45 am with Dr.Silva. Patient is agreeable to date and time. Encounter closed.

## 2017-10-11 NOTE — Patient Instructions (Signed)

## 2017-10-11 NOTE — Progress Notes (Signed)
GYNECOLOGY  VISIT   HPI: 67 y.o.   Married  Caucasian  female   [phone] with [name] was 05/16/2006.   here for sore inner left thigh noted 5 days ago.   Feels rough and it was bleeding.  She noticed the bleeding 5 days ago with wiping.  No pain.   Denies blood from the vagina or rectum.   No known insect bites.   Not bicycle riding.   GYNECOLOGIC HISTORY: [name] was 05/16/2006. Contraception:  Postmenopausal Menopausal hormone therapy:  none Last mammogram:  02/14/17 BIRADS 1 negative/density c. Normal breast MRI 03/22/16.  Last pap smear:   02/14/16 Neg:Neg HR HPV 11/30/12 ASCUS. HR HPV: Neg        OB History    Gravida  3   Para  3   Term  3   Preterm      AB      Living  3     SAB      TAB      Ectopic      Multiple      Live Births                 Patient Active Problem List   Diagnosis Date Noted  . Osteoporosis without current pathological fracture 03/23/2016  . Family history of breast cancer 01/01/2014  . Snoring 09/19/2013  . Routine general medical examination at a health care facility 09/17/2012  . Hyperglycemia 12/02/2011  . Pure hypercholesterolemia 12/02/2011  . Back pain, thoracic 11/03/2011  . Essential hypertension 09/19/2008    Past Medical History:  Diagnosis Date  . Cancer ([facility])    basal cell, face  . Hypertension   . Osteopenia   . Osteoporosis   . Rectal bleeding     Past Surgical History:  Procedure Laterality Date  . BREAST SURGERY Left 1998   remove fibroadenoma  . KNEE ARTHROSCOPY Left   . repair broken arm  2008  . TUBAL LIGATION  1994    Current Outpatient Medications  Medication Sig Dispense Refill  . aspirin 81 MG tablet Take 81 mg by mouth daily.    . calcium carbonate (TUMS - DOSED IN MG ELEMENTAL CALCIUM) 500 MG chewable tablet Chew 1 tablet by mouth as needed.     . Cholecalciferol (VITAMIN D3) 2000 UNITS TABS Take by mouth every  morning.      . Multiple Vitamin (MULTIVITAMIN) tablet Take 1 tablet by mouth daily.    . Probiotic Product (PROBIOTIC PO) Take by mouth daily.     . raloxifene (EVISTA) 60 MG tablet Take 1 tablet (60 mg total) by mouth daily. 30 tablet 8  . telmisartan (MICARDIS) 40 MG tablet Take 1 tablet (40 mg total) by mouth daily. 90 tablet 1   No current facility-administered medications for this visit.      ALLERGIES: Patient has no known allergies.  Family History  Problem Relation Age of Onset  . Diabetes Father   . Heart disease Father   . Hypertension Father   . Esophageal cancer Father 74  . Breast cancer Mother 77  . Hypertension Mother   . Osteoporosis Mother   . Breast cancer Sister 13       BRCA negative  . Diabetes Sister   . Breast cancer Sister 5  . Cancer Paternal Aunt        unknown form of cancer over the age of 37  . Thyroid cancer Cousin  maternal first cousin  . Breast cancer Cousin        died in her 24s; paternal cousin  . Breast cancer Cousin        paternal first cousin  . Osteoporosis Brother   . Colon cancer Neg Hx   . Stomach cancer Neg Hx   . Rectal cancer Neg Hx     Social History   Socioeconomic History  . Marital status: Married    Spouse name: Not on file  . Number of children: 3  . Years of education: Not on file  . Highest education level: Not on file  Occupational History    Employer: [employer]  Social Needs  . Financial resource strain: Not on file  . Food insecurity:    Worry: Not on file    Inability: Not on file  . Transportation needs:    Medical: Not on file    Non-medical: Not on file  Tobacco Use  . Smoking status: Former Smoker    Packs/day: 0.50    Years: 5.00    Pack years: 2.50    Types: Cigarettes    Last attempt to quit: 12/02/1974    Years since quitting: 42.8  . Smokeless tobacco: Never Used  Substance and Sexual Activity  . Alcohol use: Yes    Alcohol/week: 4.2 oz    Types: 7 Glasses of wine per  week    Comment: occ glass of wine, drink of choice beer  . Drug use: No  . Sexual activity: Yes    Partners: Male    Birth control/protection: Post-menopausal  Lifestyle  . Physical activity:    Days per week: Not on file    Minutes per session: Not on file  . Stress: Not on file  Relationships  . Social connections:    Talks on phone: Not on file    Gets together: Not on file    Attends religious service: Not on file    Active member of club or organization: Not on file    Attends meetings of clubs or organizations: Not on file    Relationship status: Not on file  . Intimate partner violence:    Fear of current or ex partner: Not on file    Emotionally abused: Not on file    Physically abused: Not on file    Forced sexual activity: Not on file  Other Topics Concern  . Not on file  Social History Narrative   Daily caffeine use    Review of Systems  Constitutional: Negative.   HENT: Negative.   Eyes: Negative.   Respiratory: Negative.   Cardiovascular: Negative.   Gastrointestinal: Negative.   Endocrine: Negative.   Genitourinary:       Vulvar lump  Musculoskeletal: Negative.   Skin: Negative.   Allergic/Immunologic: Negative.   Neurological: Negative.   Hematological: Negative.   Psychiatric/Behavioral: Negative.     PHYSICAL EXAMINATION:    BP 126/80 (BP Location: Right Arm, Patient Position: Sitting, Cuff Size: Normal)   Pulse 68   Resp 16   Ht 5' 7" (1.702 m)   Wt 124 lb (56.2 kg)   LMP 05/16/2006   BMI 19.42 kg/m     General appearance: alert, cooperative and appears stated age    Pelvic: External genitalia:  Left inferior labia majora/minora junction.  1 mm red dot with slight induration around hair follicle.               Urethra:  normal appearing urethra  with no masses, tenderness or lesions              Bartholins and Skenes: normal                Chaperone was present for exam.  ASSESSMENT  Folliculitis.   No concerning skin lesion  seen.   PLAN  We talked about irritation of hair follicles.  No biopsy or abx needed. Return for recurrence and prn.    An After Visit Summary was printed and given to the patient.  __15____ minutes face to face time of which over 50% was spent in counseling.

## 2017-11-06 ENCOUNTER — Other Ambulatory Visit: Payer: Self-pay | Admitting: Internal Medicine

## 2017-11-06 DIAGNOSIS — I10 Essential (primary) hypertension: Secondary | ICD-10-CM

## 2017-11-06 MED ORDER — AZILSARTAN MEDOXOMIL 40 MG PO TABS: 1.0000 | ORAL_TABLET | Freq: Every day | ORAL | 1 refills | Status: DC

## 2018-03-23 ENCOUNTER — Ambulatory Visit: Payer: BLUE CROSS/BLUE SHIELD | Admitting: Obstetrics and Gynecology

## 2018-03-23 NOTE — Progress Notes (Addendum)
67 y.o. G86P3003 Married Caucasian female here for annual exam.    Doing well on Actonel.   2 new grandchildren.   PCP:  Scarlette Calico, MD   [name] was 05/16/2006.           Sexually active: Yes.   female [name]    Exercising: Yes.    swimming and running Smoker:  no  Health Maintenance: Pap:  02/14/16 Neg:Neg HR HPV             11/30/12 ASCUS. HR HPV: Neg History of abnormal Pap: No MMG: 10/4//2019 [facility] - BI-RADS2, cat C density.  Colonoscopy: 08/29/12 Mild diverticulosis. Repeat 10 years  BMD:  03-08-16  Result :Osteoporosis-- Evista.  TDaP: 2014 Gardasil:   no HIV: 03-22-16 NR Hep C: 03-22-16 Neg Screening Labs:  Hb today: PCP  Flu vaccine:  Needed.    reports that she quit smoking about 43 years ago. Her smoking use included cigarettes. She has a 2.50 pack-year smoking history. She has never used smokeless tobacco. She reports that she drinks about 7.0 standard drinks of alcohol per week. She reports that she does not use drugs.  Past Medical History:  Diagnosis Date  . Cancer ([place])    basal cell, face  . Hypertension   . Osteopenia   . Osteoporosis   . Rectal bleeding     Past Surgical History:  Procedure Laterality Date  . BREAST SURGERY Left 1998   remove fibroadenoma  . KNEE ARTHROSCOPY Left   . repair broken arm  2008  . TUBAL LIGATION  1994    Current Outpatient Medications  Medication Sig Dispense Refill  . aspirin 81 MG tablet Take 81 mg by mouth daily.    . Azilsartan Medoxomil (EDARBI) 40 MG TABS Take 1 tablet by mouth daily. 90 tablet 1  . calcium carbonate (TUMS - DOSED IN MG ELEMENTAL CALCIUM) 500 MG chewable tablet Chew 1 tablet by mouth as needed.     . Cholecalciferol (VITAMIN D3) 2000 UNITS TABS Take by mouth every morning.      . Multiple Vitamin (MULTIVITAMIN) tablet Take 1 tablet by mouth daily.    . Probiotic Product (PROBIOTIC PO) Take by mouth daily.     .  raloxifene (EVISTA) 60 MG tablet Take 1 tablet (60 mg total) by mouth daily. 30 tablet 8   No current facility-administered medications for this visit.     Family History  Problem Relation Age of Onset  . Diabetes Father   . Heart disease Father   . Hypertension Father   . Esophageal cancer Father 57  . Breast cancer Mother 24  . Hypertension Mother   . Osteoporosis Mother   . Breast cancer Sister 54       BRCA negative  . Diabetes Sister   . Breast cancer Sister 60  . Cancer Paternal Aunt        unknown form of cancer over the age of 24  . Thyroid cancer Cousin        maternal first cousin  . Breast cancer Cousin        died in her 36s; paternal cousin  . Breast cancer Cousin        paternal first cousin  . Osteoporosis Brother   . Colon cancer Neg Hx   . Stomach cancer Neg Hx   . Rectal cancer Neg Hx     Review of Systems  All other  systems reviewed and are negative.   Exam:   BP 104/76 (BP Location: Right Arm, Patient Position: Sitting, Cuff Size: Normal)   Pulse 70   Resp 16   Ht 5' 6.5" (1.689 m)   Wt 123 lb (55.8 kg)   LMP 05/16/2006   BMI 19.56 kg/m     General appearance: alert, cooperative and appears stated age Head: Normocephalic, without obvious abnormality, atraumatic Neck: no adenopathy, supple, symmetrical, trachea midline and thyroid normal to inspection and palpation Lungs: clear to auscultation bilaterally Breasts: normal appearance, no masses or tenderness, No nipple retraction or dimpling, No nipple discharge or bleeding, No axillary or supraclavicular adenopathy Heart: regular rate and rhythm Abdomen: soft, non-tender; no masses, no organomegaly Extremities: extremities normal, atraumatic, no cyanosis or edema Skin: Skin color, texture, turgor normal. No rashes or lesions Lymph nodes: Cervical, supraclavicular, and axillary nodes normal. No abnormal inguinal nodes palpated Neurologic: Grossly normal  Pelvic: External genitalia:  no  lesions              Urethra:  normal appearing urethra with no masses, tenderness or lesions              Bartholins and Skenes: normal                 Vagina: normal appearing vagina with normal color and discharge, no lesions              Cervix: no lesions              Pap taken: No. Bimanual Exam:  Uterus:  normal size, contour, position, consistency, mobility, non-tender              Adnexa: no mass, fullness, tenderness              Rectal exam: Yes.  .  Confirms.              Anus:  normal sphincter tone,  Small sebaceous cyst at 8:00.    Chaperone was present for exam.  Assessment:   Well woman visit with normal exam. FH breast cancer.  Osteoporosis.  On Evista.  Plan: Mammogram screening. Recommended self breast awareness. Pap and HR HPV as above. Guidelines for Calcium, Vitamin D, regular exercise program including cardiovascular and weight bearing exercise. BMD ordered.  Refill of Evista for one year.  BMD at [facility].  WIll fax order.  Check vit D, calcium, and PTH levels. Labs with PCP.  Follow up annually and prn.   After visit summary provided.

## 2018-03-26 ENCOUNTER — Ambulatory Visit: Payer: BLUE CROSS/BLUE SHIELD | Admitting: Obstetrics and Gynecology

## 2018-03-26 ENCOUNTER — Other Ambulatory Visit: Payer: Self-pay

## 2018-03-26 ENCOUNTER — Encounter: Payer: Self-pay | Admitting: Obstetrics and Gynecology

## 2018-03-26 VITALS — BP 104/76 | HR 70 | Resp 16 | Ht 66.5 in | Wt 123.0 lb

## 2018-03-26 DIAGNOSIS — M810 Age-related osteoporosis without current pathological fracture: Secondary | ICD-10-CM

## 2018-03-26 DIAGNOSIS — Z01419 Encounter for gynecological examination (general) (routine) without abnormal findings: Secondary | ICD-10-CM | POA: Diagnosis not present

## 2018-03-26 MED ORDER — RALOXIFENE HCL 60 MG PO TABS: 60.0000 mg | ORAL_TABLET | Freq: Every day | ORAL | 11 refills | Status: DC

## 2018-03-26 NOTE — Patient Instructions (Signed)

## 2018-03-27 LAB — PTH, INTACT AND CALCIUM
Calcium: 9.6 mg/dL (ref 8.7–10.3)
PTH: 27 pg/mL (ref 15–65)

## 2018-03-27 LAB — VITAMIN D 25 HYDROXY (VIT D DEFICIENCY, FRACTURES): VIT D 25 HYDROXY: 42.2 ng/mL (ref 30.0–100.0)

## 2018-04-09 ENCOUNTER — Ambulatory Visit (INDEPENDENT_AMBULATORY_CARE_PROVIDER_SITE_OTHER): Payer: BLUE CROSS/BLUE SHIELD | Admitting: Internal Medicine

## 2018-04-09 ENCOUNTER — Encounter: Payer: Self-pay | Admitting: Internal Medicine

## 2018-04-09 ENCOUNTER — Other Ambulatory Visit (INDEPENDENT_AMBULATORY_CARE_PROVIDER_SITE_OTHER): Payer: BLUE CROSS/BLUE SHIELD

## 2018-04-09 VITALS — BP 128/88 | HR 65 | Temp 98.4°F | Resp 16 | Ht 66.5 in | Wt 125.2 lb

## 2018-04-09 DIAGNOSIS — R945 Abnormal results of liver function studies: Secondary | ICD-10-CM | POA: Diagnosis not present

## 2018-04-09 DIAGNOSIS — Z23 Encounter for immunization: Secondary | ICD-10-CM

## 2018-04-09 DIAGNOSIS — R7989 Other specified abnormal findings of blood chemistry: Secondary | ICD-10-CM

## 2018-04-09 DIAGNOSIS — E7800 Pure hypercholesterolemia, unspecified: Secondary | ICD-10-CM

## 2018-04-09 DIAGNOSIS — I10 Essential (primary) hypertension: Secondary | ICD-10-CM

## 2018-04-09 DIAGNOSIS — Z0000 Encounter for general adult medical examination without abnormal findings: Secondary | ICD-10-CM

## 2018-04-09 LAB — COMPREHENSIVE METABOLIC PANEL
ALT: 40 U/L — ABNORMAL HIGH (ref 0–35)
AST: 30 U/L (ref 0–37)
Albumin: 4.5 g/dL (ref 3.5–5.2)
Alkaline Phosphatase: 69 U/L (ref 39–117)
BUN: 13 mg/dL (ref 6–23)
CHLORIDE: 106 meq/L (ref 96–112)
CO2: 26 meq/L (ref 19–32)
Calcium: 9.5 mg/dL (ref 8.4–10.5)
Creatinine, Ser: 0.83 mg/dL (ref 0.40–1.20)
GFR: 73.34 mL/min (ref >60.00)
Glucose, Bld: 85 mg/dL (ref 70–99)
Potassium: 4.3 mEq/L (ref 3.5–5.1)
Sodium: 141 mEq/L (ref 135–145)
Total Bilirubin: 0.6 mg/dL (ref 0.2–1.2)
Total Protein: 6.4 g/dL (ref 6.0–8.3)

## 2018-04-09 LAB — LIPID PANEL
CHOL/HDL RATIO: 2
Cholesterol: 182 mg/dL (ref 0–200)
HDL: 92.8 mg/dL (ref >39.00)
LDL CALC: 79 mg/dL (ref 0–99)
NonHDL: 89.34
Triglycerides: 52 mg/dL (ref 0.0–149.0)
VLDL: 10.4 mg/dL (ref 0.0–40.0)

## 2018-04-09 LAB — CBC WITH DIFFERENTIAL/PLATELET
BASOS ABS: 0.1 10*3/uL (ref 0.0–0.1)
Basophils Relative: 1.4 % (ref 0.0–3.0)
Eosinophils Absolute: 0 10*3/uL (ref 0.0–0.7)
Eosinophils Relative: 0.3 % (ref 0.0–5.0)
HCT: 42.1 % (ref 36.0–46.0)
Hemoglobin: 14 g/dL (ref 12.0–15.0)
LYMPHS ABS: 2.6 10*3/uL (ref 0.7–4.0)
Lymphocytes Relative: 37.6 % (ref 12.0–46.0)
MCHC: 33.3 g/dL (ref 30.0–36.0)
MCV: 92 fl (ref 78.0–100.0)
Monocytes Absolute: 0.5 10*3/uL (ref 0.1–1.0)
Monocytes Relative: 7.7 % (ref 3.0–12.0)
NEUTROS ABS: 3.6 10*3/uL (ref 1.4–7.7)
NEUTROS PCT: 53 % (ref 43.0–77.0)
PLATELETS: 208 10*3/uL (ref 150.0–400.0)
RBC: 4.58 Mil/uL (ref 3.87–5.11)
RDW: 13.9 % (ref 11.5–15.5)
WBC: 6.8 10*3/uL (ref 4.0–10.5)

## 2018-04-09 LAB — TSH: TSH: 0.87 u[IU]/mL (ref 0.35–4.50)

## 2018-04-09 MED ORDER — ZOSTER VAC RECOMB ADJUVANTED 50 MCG/0.5ML IM SUSR: 0.5000 mL | Freq: Once | INTRAMUSCULAR | 1 refills | Status: AC

## 2018-04-09 NOTE — Patient Instructions (Signed)

## 2018-04-09 NOTE — Progress Notes (Signed)
Subjective:  Patient ID: Diane Day, female    DOB: February 22, 1951  Age: 67 y.o. MRN: [id number]  CC: Annual Exam; Hyperlipidemia; and Hypertension   HPI Diane Day presents for a CPX.  Her blood pressure has been well controlled.  She has felt well recently and offers no complaints.  Outpatient Medications Prior to Visit  Medication Sig Dispense Refill  . Azilsartan Medoxomil (EDARBI) 40 MG TABS Take 1 tablet by mouth daily. 90 tablet 1  . calcium carbonate (TUMS - DOSED IN MG ELEMENTAL CALCIUM) 500 MG chewable tablet Chew 1 tablet by mouth as needed.     . Cholecalciferol (VITAMIN D3) 2000 UNITS TABS Take by mouth every morning.      . Multiple Vitamin (MULTIVITAMIN) tablet Take 1 tablet by mouth daily.    . Probiotic Product (PROBIOTIC PO) Take by mouth daily.     . raloxifene (EVISTA) 60 MG tablet Take 1 tablet (60 mg total) by mouth daily. 30 tablet 11  . aspirin 81 MG tablet Take 81 mg by mouth daily.     No facility-administered medications prior to visit.     ROS Review of Systems  Constitutional: Negative for appetite change, diaphoresis, fatigue and unexpected weight change.  HENT: Negative.   Eyes: Negative.   Respiratory: Negative.  Negative for cough, chest tightness, shortness of breath and wheezing.   Cardiovascular: Negative for chest pain, palpitations and leg swelling.  Gastrointestinal: Negative for abdominal pain, constipation, diarrhea, nausea and vomiting.  Genitourinary: Negative.  Negative for difficulty urinating and dysuria.  Musculoskeletal: Negative.  Negative for arthralgias.  Skin: Negative.   Neurological: Negative.  Negative for dizziness, light-headedness and headaches.  Hematological: Negative for adenopathy. Does not bruise/bleed easily.    Objective:  BP 128/88 (BP Location: Left Arm, Patient Position: Sitting, Cuff Size: Normal)   Pulse 65   Temp 98.4 F (36.9 C) (Oral)   Resp 16   Ht 5' 6.5" (1.689 m)   Wt 125 lb 4  oz (56.8 kg)   LMP 05/16/2006   SpO2 97%   BMI 19.91 kg/m   BP Readings from Last 3 Encounters:  04/09/18 128/88  03/26/18 104/76  10/11/17 126/80    Wt Readings from Last 3 Encounters:  04/09/18 125 lb 4 oz (56.8 kg)  03/26/18 123 lb (55.8 kg)  10/11/17 124 lb (56.2 kg)    Physical Exam  Constitutional: She is oriented to person, place, and time. No distress.  HENT:  Mouth/Throat: Oropharynx is clear and moist. No oropharyngeal exudate.  Eyes: Conjunctivae are normal. No scleral icterus.  Neck: Normal range of motion. Neck supple. No JVD present. No thyromegaly present.  Cardiovascular: Normal rate, regular rhythm and normal heart sounds. Exam reveals no gallop.  No murmur heard. Pulmonary/Chest: Effort normal and breath sounds normal. No respiratory distress. She has no wheezes. She has no rales.  Abdominal: Soft. Bowel sounds are normal. She exhibits no mass. There is no hepatosplenomegaly. There is no tenderness.  Musculoskeletal: Normal range of motion. She exhibits no edema, tenderness or deformity.  Lymphadenopathy:    She has no cervical adenopathy.  Neurological: She is alert and oriented to person, place, and time.  Skin: Skin is warm and dry. She is not diaphoretic. No pallor.  Vitals reviewed.   Lab Results  Component Value Date   WBC 6.8 04/09/2018   HGB 14.0 04/09/2018   HCT 42.1 04/09/2018   PLT 208.0 04/09/2018   GLUCOSE 85 04/09/2018   CHOL 182  04/09/2018   TRIG 52.0 04/09/2018   HDL 92.80 04/09/2018   LDLCALC 79 04/09/2018   ALT 40 (H) 04/09/2018   AST 30 04/09/2018   NA 141 04/09/2018   K 4.3 04/09/2018   CL 106 04/09/2018   CREATININE 0.83 04/09/2018   BUN 13 04/09/2018   CO2 26 04/09/2018   TSH 0.87 04/09/2018   HGBA1C 5.6 11/20/2014    Mr Breast Bilateral W Wo Contrast  Result Date: 03/22/2016 CLINICAL DATA:  Strong family history of breast cancer LABS:  Creatinine 0.8, GFR 73 EXAM: BILATERAL BREAST MRI WITH AND WITHOUT CONTRAST  TECHNIQUE: Multiplanar, multisequence MR images of both breasts were obtained prior to and following the intravenous administration of 11 ml of MultiHance. THREE-DIMENSIONAL MR IMAGE RENDERING ON INDEPENDENT WORKSTATION: Three-dimensional MR images were rendered by post-processing of the original MR data on an independent workstation. The three-dimensional MR images were interpreted, and findings are reported in the following complete MRI report for this study. Three dimensional images were evaluated at the independent DynaCad workstation COMPARISON:  Previous exam(s). FINDINGS: Breast composition: b. Scattered fibroglandular tissue. Background parenchymal enhancement: Mild Right breast: No mass or abnormal enhancement. Left breast: No mass or abnormal enhancement. Lymph nodes: No abnormal appearing lymph nodes. Ancillary findings:  None. IMPRESSION: No MRI evidence of malignancy. RECOMMENDATION: Annual mammography and breast MRI for further surveillance. BI-RADS CATEGORY  2: Benign. Electronically Signed   By: Dorise Bullion III M.D   On: 03/22/2016 14:49    Assessment & Plan:   Diane Day was seen today for annual exam, hyperlipidemia and hypertension.  Diagnoses and all orders for this visit:  Need for influenza vaccination -     Flu Vaccine QUAD 36+ mos IM  Essential hypertension- Her blood pressure is well controlled.  Labs are negative for secondary causes or endorgan damage.  Will continue the ARB at the current dose. -     CBC with Differential/Platelet; Future -     Comprehensive metabolic panel; Future -     TSH; Future  Pure hypercholesterolemia-she has a low ASCVD risk score so I do not recommend a statin for CV risk reduction. -     Lipid panel; Future -     Comprehensive metabolic panel; Future -     TSH; Future  Elevated LFTs- Her ALT is mildly elevated.  Her hep C antibody was -2 years ago.  I have asked her to return in the next few months to have this evaluated.  My thoughts  are this disease related to alcohol intake or fatty liver disease.    Routine general medical examination at a health care facility- Exam completed, labs reviewed, vaccines reviewed and updated, her Pap/mammogram/screening for colon cancer are all up-to-date.  Patient education material was given.  Other orders -     Zoster Vaccine Adjuvanted [facility]) injection; Inject 0.5 mLs into the muscle once for 1 dose.   I have discontinued Diane Day "Diane Day"'s aspirin. I am also having her start on Zoster Vaccine Adjuvanted. Additionally, I am having her maintain her calcium carbonate, Vitamin D3, Probiotic Product (PROBIOTIC PO), multivitamin, Azilsartan Medoxomil, and raloxifene.  Meds ordered this encounter  Medications  . Zoster Vaccine Adjuvanted [facility]) injection    Sig: Inject 0.5 mLs into the muscle once for 1 dose.    Dispense:  0.5 mL    Refill:  1     Follow-up: Return in about 6 months (around 10/08/2018).  Scarlette Calico, MD

## 2018-04-11 DIAGNOSIS — R945 Abnormal results of liver function studies: Secondary | ICD-10-CM

## 2018-04-11 DIAGNOSIS — R7989 Other specified abnormal findings of blood chemistry: Secondary | ICD-10-CM | POA: Insufficient documentation

## 2018-04-30 ENCOUNTER — Encounter: Payer: Self-pay | Admitting: Obstetrics and Gynecology

## 2018-05-15 DIAGNOSIS — H524 Presbyopia: Secondary | ICD-10-CM | POA: Diagnosis not present

## 2018-05-15 DIAGNOSIS — H2513 Age-related nuclear cataract, bilateral: Secondary | ICD-10-CM | POA: Diagnosis not present

## 2018-05-25 ENCOUNTER — Encounter: Payer: Self-pay | Admitting: Obstetrics and Gynecology

## 2018-05-25 DIAGNOSIS — Z8262 Family history of osteoporosis: Secondary | ICD-10-CM | POA: Diagnosis not present

## 2018-05-25 DIAGNOSIS — J019 Acute sinusitis, unspecified: Secondary | ICD-10-CM | POA: Diagnosis not present

## 2018-05-25 DIAGNOSIS — M810 Age-related osteoporosis without current pathological fracture: Secondary | ICD-10-CM | POA: Diagnosis not present

## 2018-06-05 ENCOUNTER — Telehealth: Payer: Self-pay | Admitting: Obstetrics and Gynecology

## 2018-06-05 NOTE — Telephone Encounter (Signed)
Please contact patient regarding BMD form [facility] for 05/25/08.   She has osteoporosis of the hips and spine.  Her spine had an increase in bone density and her hips are stable on the final report.   She can continue the Evista, calcium, vit D, and weight bearing exercise.  Next BMD is due in 2 years.

## 2018-06-05 NOTE — Telephone Encounter (Signed)
Left message to call Kijuana Ruppel, RN at [facility] [phone].   

## 2018-06-06 NOTE — Telephone Encounter (Signed)
Spoke with patient, advised as seen below per Dr. Silva.  Patient verbalizes understanding and is agreeable.  Encounter closed.  

## 2018-07-04 ENCOUNTER — Other Ambulatory Visit: Payer: Self-pay | Admitting: Internal Medicine

## 2018-07-05 DIAGNOSIS — R69 Illness, unspecified: Secondary | ICD-10-CM | POA: Diagnosis not present

## 2018-12-26 ENCOUNTER — Other Ambulatory Visit: Payer: Self-pay | Admitting: Internal Medicine

## 2019-01-29 DIAGNOSIS — R69 Illness, unspecified: Secondary | ICD-10-CM | POA: Diagnosis not present

## 2019-01-30 ENCOUNTER — Other Ambulatory Visit: Payer: Self-pay

## 2019-01-30 DIAGNOSIS — Z20822 Contact with and (suspected) exposure to covid-19: Secondary | ICD-10-CM

## 2019-01-31 LAB — NOVEL CORONAVIRUS, NAA: SARS-CoV-2, NAA: NOT DETECTED

## 2019-02-07 DIAGNOSIS — R69 Illness, unspecified: Secondary | ICD-10-CM | POA: Diagnosis not present

## 2019-02-14 DIAGNOSIS — L905 Scar conditions and fibrosis of skin: Secondary | ICD-10-CM | POA: Diagnosis not present

## 2019-02-14 DIAGNOSIS — L819 Disorder of pigmentation, unspecified: Secondary | ICD-10-CM | POA: Diagnosis not present

## 2019-02-14 DIAGNOSIS — Z85828 Personal history of other malignant neoplasm of skin: Secondary | ICD-10-CM | POA: Diagnosis not present

## 2019-02-27 DIAGNOSIS — R69 Illness, unspecified: Secondary | ICD-10-CM | POA: Diagnosis not present

## 2019-03-06 ENCOUNTER — Encounter: Payer: Self-pay | Admitting: Obstetrics and Gynecology

## 2019-03-06 DIAGNOSIS — Z1231 Encounter for screening mammogram for malignant neoplasm of breast: Secondary | ICD-10-CM | POA: Diagnosis not present

## 2019-03-06 DIAGNOSIS — Z803 Family history of malignant neoplasm of breast: Secondary | ICD-10-CM | POA: Diagnosis not present

## 2019-04-10 NOTE — Progress Notes (Signed)
68 y.o. G85P3003 Married Caucasian female here for annual exam.    Noting dry skin.  Intercourse is dry.  She is on Evista since October, 2018.  No prior other tx for osteoporosis.   Lab with PCP next week.   PCP: Scarlette Calico, MD    [name] was 05/16/2006.           Sexually active: Yes.    [name]    Exercising: Yes.    swims daily Smoker:  no  Health Maintenance: Pap: 02/24/16 Neg:Neg HR HPV, 11-30-12 ASCUS:Neg HR HPV History of abnormal Pap:  ASCUS:Neg HR HPV MMG: 02-14-19 3D/Neg/density C/Birads1 Colonoscopy:08/29/12 Mild diverticulosis. Repeat 10 years  BMD: 06-04-18 Result :Osteoporosis of hips and spine--on Evista TDaP:  2014 Gardasil:   no HIV:03-22-16 NR Hep C:03-22-16 NEG Screening Labs:  PCP. Flu vaccine:  Completed.    reports that she quit smoking about 44 years ago. Her smoking use included cigarettes. She has a 2.50 pack-year smoking history. She has never used smokeless tobacco. She reports current alcohol use of about 7.0 standard drinks of alcohol per week. She reports that she does not use drugs.  Past Medical History:  Diagnosis Date  . Cancer ([place])    basal cell, face  . Hypertension   . Osteopenia   . Osteoporosis   . Rectal bleeding     Past Surgical History:  Procedure Laterality Date  . BREAST SURGERY Left 1998   remove fibroadenoma  . KNEE ARTHROSCOPY Left   . repair broken arm  2008  . TUBAL LIGATION  1994    Current Outpatient Medications  Medication Sig Dispense Refill  . aspirin (ASPIRIN 81) 81 MG EC tablet Take 1 tablet by mouth daily.    . calcium carbonate (TUMS - DOSED IN MG ELEMENTAL CALCIUM) 500 MG chewable tablet Chew 1 tablet by mouth as needed.     . Cholecalciferol (VITAMIN D3) 2000 UNITS TABS Take by mouth every morning.      . Multiple Vitamin (MULTIVITAMIN) tablet Take 1 tablet by mouth daily.    . psyllium (METAMUCIL) 58.6 % powder Take 1  packet by mouth daily.    . raloxifene (EVISTA) 60 MG tablet Take 1 tablet (60 mg total) by mouth daily. 30 tablet 11  . telmisartan (MICARDIS) 40 MG tablet TAKE 1 TABLET ONCE DAILY. 90 tablet 0  . Probiotic Product (PROBIOTIC PO) Take by mouth daily.      No current facility-administered medications for this visit.     Family History  Problem Relation Age of Onset  . Diabetes Father   . Heart disease Father   . Hypertension Father   . Esophageal cancer Father 30  . Breast cancer Mother 50  . Hypertension Mother   . Osteoporosis Mother   . Breast cancer Sister 60       BRCA negative  . Diabetes Sister   . Breast cancer Sister 75  . Cancer Paternal Aunt        unknown form of cancer over the age of 68  . Thyroid cancer Cousin        maternal first cousin  . Breast cancer Cousin        died in her 39s; paternal cousin  . Breast cancer Cousin        paternal first cousin  . Osteoporosis Brother   . Colon cancer Neg Hx   . Stomach cancer Neg Hx   .  Rectal cancer Neg Hx     Review of Systems  All other systems reviewed and are negative.   Exam:   BP 140/84   Pulse 70   Temp (!) 97.2 F (36.2 C) (Temporal)   Resp 14   Ht 5' 6.5" (1.689 m)   Wt 132 lb (59.9 kg)   LMP 05/16/2006   BMI 20.99 kg/m     General appearance: alert, cooperative and appears stated age Head: normocephalic, without obvious abnormality, atraumatic Neck: no adenopathy, supple, symmetrical, trachea midline and thyroid normal to inspection and palpation Lungs: clear to auscultation bilaterally Breasts: normal appearance, no masses or tenderness, No nipple retraction or dimpling, No nipple discharge or bleeding, No axillary adenopathy Heart: regular rate and rhythm Abdomen: soft, non-tender; no masses, no organomegaly Extremities: extremities normal, atraumatic, no cyanosis or edema Skin: skin color, texture, turgor normal. No rashes or lesions Lymph nodes: cervical, supraclavicular, and axillary  nodes normal. Neurologic: grossly normal  Pelvic: External genitalia:  no lesions              No abnormal inguinal nodes palpated.              Urethra:  normal appearing urethra with no masses, tenderness or lesions              Bartholins and Skenes: normal                 Vagina: normal appearing vagina with normal color and discharge, no lesions              Cervix: no lesions              Pap taken: No. Bimanual Exam:  Uterus:  normal size, contour, position, consistency, mobility, non-tender              Adnexa: no mass, fullness, tenderness              Rectal exam: Yes.  .  Confirms.              Anus:  normal sphincter tone, no lesions  Chaperone was present for exam.  Assessment:   Well woman visit with normal exam. FH breast cancer.  Increased risk of breast cancer.  31 - 37%.   Has metal rod in her right arm.  Osteoporosis.  Stable. On Evista.  Plan: Mammogram screening discussed. Breast MRI with contrast.  Self breast awareness reviewed. Pap and HR HPV as above.  Cervical cancer screening due in 2022.  5 year risk of CIN 3 is 0.14%. Guidelines for Calcium, Vitamin D, regular exercise program including cardiovascular and weight bearing exercise. We discussed hydration through Replens, cooking oils, and vit E suppositories. Refill of Evista for one year.  BMD in Jan. 2022. Follow up annually and prn.    After visit summary offered.

## 2019-04-13 ENCOUNTER — Encounter: Payer: Self-pay | Admitting: Internal Medicine

## 2019-04-14 ENCOUNTER — Other Ambulatory Visit: Payer: Self-pay | Admitting: Obstetrics and Gynecology

## 2019-04-14 ENCOUNTER — Encounter: Payer: Self-pay | Admitting: Internal Medicine

## 2019-04-14 ENCOUNTER — Other Ambulatory Visit: Payer: Self-pay | Admitting: Internal Medicine

## 2019-04-15 ENCOUNTER — Other Ambulatory Visit: Payer: Self-pay

## 2019-04-15 ENCOUNTER — Ambulatory Visit (INDEPENDENT_AMBULATORY_CARE_PROVIDER_SITE_OTHER): Payer: Medicare HMO | Admitting: Obstetrics and Gynecology

## 2019-04-15 ENCOUNTER — Encounter: Payer: Self-pay | Admitting: Obstetrics and Gynecology

## 2019-04-15 ENCOUNTER — Other Ambulatory Visit: Payer: Self-pay | Admitting: Internal Medicine

## 2019-04-15 VITALS — BP 140/84 | HR 70 | Temp 97.2°F | Resp 14 | Ht 66.5 in | Wt 132.0 lb

## 2019-04-15 DIAGNOSIS — Z01419 Encounter for gynecological examination (general) (routine) without abnormal findings: Secondary | ICD-10-CM | POA: Diagnosis not present

## 2019-04-15 DIAGNOSIS — Z1231 Encounter for screening mammogram for malignant neoplasm of breast: Secondary | ICD-10-CM | POA: Diagnosis not present

## 2019-04-15 MED ORDER — RALOXIFENE HCL 60 MG PO TABS: 60.0000 mg | ORAL_TABLET | Freq: Every day | ORAL | 11 refills | Status: DC

## 2019-04-15 NOTE — Patient Instructions (Signed)

## 2019-04-23 ENCOUNTER — Encounter: Payer: Self-pay | Admitting: Internal Medicine

## 2019-04-23 ENCOUNTER — Ambulatory Visit (INDEPENDENT_AMBULATORY_CARE_PROVIDER_SITE_OTHER): Payer: Medicare HMO | Admitting: Internal Medicine

## 2019-04-23 ENCOUNTER — Other Ambulatory Visit (INDEPENDENT_AMBULATORY_CARE_PROVIDER_SITE_OTHER): Payer: Medicare HMO

## 2019-04-23 ENCOUNTER — Other Ambulatory Visit: Payer: Self-pay

## 2019-04-23 VITALS — BP 154/100 | HR 63 | Temp 98.0°F | Ht 66.5 in | Wt 129.0 lb

## 2019-04-23 DIAGNOSIS — M810 Age-related osteoporosis without current pathological fracture: Secondary | ICD-10-CM | POA: Diagnosis not present

## 2019-04-23 DIAGNOSIS — Z23 Encounter for immunization: Secondary | ICD-10-CM

## 2019-04-23 DIAGNOSIS — M7661 Achilles tendinitis, right leg: Secondary | ICD-10-CM | POA: Diagnosis not present

## 2019-04-23 DIAGNOSIS — Z0000 Encounter for general adult medical examination without abnormal findings: Secondary | ICD-10-CM

## 2019-04-23 DIAGNOSIS — R7989 Other specified abnormal findings of blood chemistry: Secondary | ICD-10-CM

## 2019-04-23 DIAGNOSIS — I10 Essential (primary) hypertension: Secondary | ICD-10-CM

## 2019-04-23 DIAGNOSIS — E7800 Pure hypercholesterolemia, unspecified: Secondary | ICD-10-CM

## 2019-04-23 DIAGNOSIS — M7662 Achilles tendinitis, left leg: Secondary | ICD-10-CM

## 2019-04-23 LAB — VITAMIN D 25 HYDROXY (VIT D DEFICIENCY, FRACTURES): VITD: 56.07 ng/mL (ref 30.00–100.00)

## 2019-04-23 LAB — CBC WITH DIFFERENTIAL/PLATELET
Basophils Absolute: 0.1 10*3/uL (ref 0.0–0.1)
Basophils Relative: 1.4 % (ref 0.0–3.0)
Eosinophils Absolute: 0 10*3/uL (ref 0.0–0.7)
Eosinophils Relative: 0.6 % (ref 0.0–5.0)
HCT: 40.8 % (ref 36.0–46.0)
Hemoglobin: 13.8 g/dL (ref 12.0–15.0)
Lymphocytes Relative: 44.1 % (ref 12.0–46.0)
Lymphs Abs: 1.8 10*3/uL (ref 0.7–4.0)
MCHC: 33.7 g/dL (ref 30.0–36.0)
MCV: 91.4 fl (ref 78.0–100.0)
Monocytes Absolute: 0.4 10*3/uL (ref 0.1–1.0)
Monocytes Relative: 8.7 % (ref 3.0–12.0)
Neutro Abs: 1.8 10*3/uL (ref 1.4–7.7)
Neutrophils Relative %: 45.2 % (ref 43.0–77.0)
Platelets: 207 10*3/uL (ref 150.0–400.0)
RBC: 4.46 Mil/uL (ref 3.87–5.11)
RDW: 13.7 % (ref 11.5–15.5)
WBC: 4.1 10*3/uL (ref 4.0–10.5)

## 2019-04-23 LAB — HEPATIC FUNCTION PANEL
ALT: 16 U/L (ref 0–35)
AST: 20 U/L (ref 0–37)
Albumin: 4.3 g/dL (ref 3.5–5.2)
Alkaline Phosphatase: 56 U/L (ref 39–117)
Bilirubin, Direct: 0.1 mg/dL (ref 0.0–0.3)
Total Bilirubin: 0.8 mg/dL (ref 0.2–1.2)
Total Protein: 6.2 g/dL (ref 6.0–8.3)

## 2019-04-23 LAB — LIPID PANEL
Cholesterol: 181 mg/dL (ref 0–200)
HDL: 83.9 mg/dL (ref >39.00)
LDL Cholesterol: 88 mg/dL (ref 0–99)
NonHDL: 97.4
Total CHOL/HDL Ratio: 2
Triglycerides: 45 mg/dL (ref 0.0–149.0)
VLDL: 9 mg/dL (ref 0.0–40.0)

## 2019-04-23 LAB — BASIC METABOLIC PANEL
BUN: 12 mg/dL (ref 6–23)
CO2: 29 mEq/L (ref 19–32)
Calcium: 9.4 mg/dL (ref 8.4–10.5)
Chloride: 106 mEq/L (ref 96–112)
Creatinine, Ser: 0.87 mg/dL (ref 0.40–1.20)
GFR: 65.14 mL/min (ref >60.00)
Glucose, Bld: 108 mg/dL — ABNORMAL HIGH (ref 70–99)
Potassium: 3.9 mEq/L (ref 3.5–5.1)
Sodium: 141 mEq/L (ref 135–145)

## 2019-04-23 LAB — TSH: TSH: 1.09 u[IU]/mL (ref 0.35–4.50)

## 2019-04-23 LAB — PROTIME-INR
INR: 1.1 ratio — ABNORMAL HIGH (ref 0.8–1.0)
Prothrombin Time: 12.9 s (ref 9.6–13.1)

## 2019-04-23 MED ORDER — INDAPAMIDE 1.25 MG PO TABS: 1.2500 mg | ORAL_TABLET | Freq: Every day | ORAL | 0 refills | Status: DC

## 2019-04-23 NOTE — Patient Instructions (Signed)

## 2019-04-23 NOTE — Progress Notes (Signed)
Subjective:  Patient ID: Diane Day, female    DOB: November 26, 1950  Age: 68 y.o. MRN: [id number]  CC: Annual Exam and Hypertension   This visit occurred during the SARS-CoV-2 public health emergency.  Safety protocols were in place, including screening questions prior to the visit, additional usage of staff PPE, and extensive cleaning of exam room while observing appropriate contact time as indicated for disinfecting solutions.    HPI Diane Day presents for a CPX.  She does not monitor her blood pressure.  She has been hiking a lot this year and denies any recent episodes of CP, DOE, diaphoresis, palpitations, edema, or fatigue.  She tells me she has developed Achilles tendinitis and has been intermittently taking Motrin.  She wants to see a specialist about the Achilles tendinitis.  She tells me she drinks about 2 beers a day.  Outpatient Medications Prior to Visit  Medication Sig Dispense Refill  . aspirin (ASPIRIN 81) 81 MG EC tablet Take 1 tablet by mouth daily.    . calcium carbonate (TUMS - DOSED IN MG ELEMENTAL CALCIUM) 500 MG chewable tablet Chew 1 tablet by mouth as needed.     . Cholecalciferol (VITAMIN D3) 2000 UNITS TABS Take by mouth every morning.      . Multiple Vitamin (MULTIVITAMIN) tablet Take 1 tablet by mouth daily.    . psyllium (METAMUCIL) 58.6 % powder Take 1 packet by mouth daily.    . raloxifene (EVISTA) 60 MG tablet Take 1 tablet (60 mg total) by mouth daily. 30 tablet 11  . telmisartan (MICARDIS) 40 MG tablet TAKE 1 TABLET ONCE DAILY. 90 tablet 0  . Probiotic Product (PROBIOTIC PO) Take by mouth daily.      No facility-administered medications prior to visit.    ROS Review of Systems  Constitutional: Negative for appetite change, diaphoresis and fatigue.  HENT: Negative.   Eyes: Negative for visual disturbance.  Respiratory: Negative for cough, chest tightness, shortness of breath and wheezing.   Cardiovascular: Negative for chest pain,  palpitations and leg swelling.  Gastrointestinal: Negative for abdominal pain, constipation, diarrhea, nausea and vomiting.  Endocrine: Negative.   Genitourinary: Negative.  Negative for difficulty urinating and hematuria.  Musculoskeletal: Negative.  Negative for arthralgias and myalgias.  Skin: Negative.  Negative for color change.  Neurological: Negative.  Negative for dizziness, weakness, light-headedness and headaches.  Hematological: Negative for adenopathy. Does not bruise/bleed easily.  Psychiatric/Behavioral: Negative.     Objective:  BP (!) 154/100 (BP Location: Left Arm, Patient Position: Sitting, Cuff Size: Normal) Comment: BP (R) 154/96  Pulse 63   Temp 98 F (36.7 C) (Oral)   Ht 5' 6.5" (1.689 m)   Wt 129 lb (58.5 kg)   LMP 05/16/2006   SpO2 99%   BMI 20.51 kg/m   BP Readings from Last 3 Encounters:  04/23/19 (!) 154/100  04/15/19 140/84  04/09/18 128/88    Wt Readings from Last 3 Encounters:  04/23/19 129 lb (58.5 kg)  04/15/19 132 lb (59.9 kg)  04/09/18 125 lb 4 oz (56.8 kg)    Physical Exam Vitals reviewed.  Constitutional:      Appearance: Normal appearance.  HENT:     Nose: Nose normal.     Mouth/Throat:     Mouth: Mucous membranes are moist.  Eyes:     General: No scleral icterus.    Conjunctiva/sclera: Conjunctivae normal.  Cardiovascular:     Rate and Rhythm: Normal rate and regular rhythm.  Heart sounds: No murmur.     Comments: EKG ---  Sinus  Rhythm  WITHIN NORMAL LIMITS Pulmonary:     Effort: Pulmonary effort is normal.     Breath sounds: No stridor. No wheezing, rhonchi or rales.  Abdominal:     General: Abdomen is flat. Bowel sounds are normal. There is no distension.     Palpations: Abdomen is soft. There is no hepatomegaly, splenomegaly or mass.     Tenderness: There is no abdominal tenderness.     Hernia: No hernia is present.  Musculoskeletal:     Cervical back: Neck supple.     Right lower leg: Edema (trace) present.      Left lower leg: Edema (trace) present.     Right ankle: Normal.     Left ankle: Normal.  Lymphadenopathy:     Cervical: No cervical adenopathy.  Skin:    General: Skin is warm and dry.  Neurological:     General: No focal deficit present.     Mental Status: She is alert.  Psychiatric:        Mood and Affect: Mood normal.        Behavior: Behavior normal.     Lab Results  Component Value Date   WBC 4.1 04/23/2019   HGB 13.8 04/23/2019   HCT 40.8 04/23/2019   PLT 207.0 04/23/2019   GLUCOSE 108 (H) 04/23/2019   CHOL 181 04/23/2019   TRIG 45.0 04/23/2019   HDL 83.90 04/23/2019   LDLCALC 88 04/23/2019   ALT 16 04/23/2019   AST 20 04/23/2019   NA 141 04/23/2019   K 3.9 04/23/2019   CL 106 04/23/2019   CREATININE 0.87 04/23/2019   BUN 12 04/23/2019   CO2 29 04/23/2019   TSH 1.09 04/23/2019   INR 1.1 (H) 04/23/2019   HGBA1C 5.6 11/20/2014    Mr Breast Bilateral W Wo Contrast  Result Date: 03/22/2016 CLINICAL DATA:  Strong family history of breast cancer LABS:  Creatinine 0.8, GFR 73 EXAM: BILATERAL BREAST MRI WITH AND WITHOUT CONTRAST TECHNIQUE: Multiplanar, multisequence MR images of both breasts were obtained prior to and following the intravenous administration of 11 ml of MultiHance. THREE-DIMENSIONAL MR IMAGE RENDERING ON INDEPENDENT WORKSTATION: Three-dimensional MR images were rendered by post-processing of the original MR data on an independent workstation. The three-dimensional MR images were interpreted, and findings are reported in the following complete MRI report for this study. Three dimensional images were evaluated at the independent DynaCad workstation COMPARISON:  Previous exam(s). FINDINGS: Breast composition: b. Scattered fibroglandular tissue. Background parenchymal enhancement: Mild Right breast: No mass or abnormal enhancement. Left breast: No mass or abnormal enhancement. Lymph nodes: No abnormal appearing lymph nodes. Ancillary findings:  None.  IMPRESSION: No MRI evidence of malignancy. RECOMMENDATION: Annual mammography and breast MRI for further surveillance. BI-RADS CATEGORY  2: Benign. Electronically Signed   By: Dorise Bullion III M.D   On: 03/22/2016 14:49    Assessment & Plan:   Diane Day was seen today for annual exam and hypertension.  Diagnoses and all orders for this visit:  Essential hypertension- Her blood pressure does not appear to be adequately well controlled on the ARB but this could be whitecoat phenomenon.  Unfortunately, she does not monitor her blood pressure at home so I do not have anything to compare this with.  I have asked her to add indapamide to the ARB and to monitor her blood pressure at home.  If she feels like her blood pressure is being  over controlled then she will stop taking the indapamide. -     CBC with Differential; Future -     Basic metabolic panel; Future -     indapamide (LOZOL) 1.25 MG tablet; Take 1 tablet (1.25 mg total) by mouth daily. -     EKG 12-Lead  Osteoporosis without current pathological fracture, unspecified osteoporosis type- Her vitamin D level is normal now. -     Vitamin D 25 hydroxy; Future  Elevated LFTs- Her LFTs are normal now. She has a low meld score.  Screening for viral hepatitis is negative.  I recommended that she get vaccinated against hepatitis A and B. -     Hepatitis B surface antigen; Future -     Hepatitis A antibody, total; Future -     Hepatitis B core antibody, total; Future -     Hepatitis B surface antibody; Future -     TSH; Future -     Hepatic function panel; Future -     Protime-INR; Future  Pure hypercholesterolemia- Her ASCVD risk score is less than 10 so I did not recommend a statin for CV risk reduction. -     Lipid panel; Future  Need for pneumococcal vaccination -     Pneumococcal conjugate vaccine 13-valent  Achilles tendonitis, bilateral -     Ambulatory referral to Sports Medicine   I have discontinued Diane Day  "Liz"'s Probiotic Product (PROBIOTIC PO). I am also having her start on indapamide. Additionally, I am having her maintain her calcium carbonate, Vitamin D3, multivitamin, telmisartan, aspirin, psyllium, and raloxifene.  Meds ordered this encounter  Medications  . indapamide (LOZOL) 1.25 MG tablet    Sig: Take 1 tablet (1.25 mg total) by mouth daily.    Dispense:  90 tablet    Refill:  0     Follow-up: Return in about 3 months (around 07/22/2019).  Scarlette Calico, MD

## 2019-04-24 ENCOUNTER — Encounter: Payer: Self-pay | Admitting: Internal Medicine

## 2019-04-24 LAB — HEPATITIS B SURFACE ANTIGEN: Hepatitis B Surface Ag: NONREACTIVE

## 2019-04-24 LAB — HEPATITIS B CORE ANTIBODY, TOTAL: Hep B Core Total Ab: NONREACTIVE

## 2019-04-24 LAB — HEPATITIS B SURFACE ANTIBODY,QUALITATIVE: Hep B S Ab: NONREACTIVE

## 2019-04-24 LAB — HEPATITIS A ANTIBODY, TOTAL: Hepatitis A AB,Total: NONREACTIVE

## 2019-04-25 NOTE — Assessment & Plan Note (Signed)
Exam completed Labs reviewed Vaccines reviewed and updated Cancer screenings are all up-to-date Patient education was given.

## 2019-05-21 ENCOUNTER — Other Ambulatory Visit: Payer: Self-pay | Admitting: Obstetrics and Gynecology

## 2019-05-21 ENCOUNTER — Telehealth: Payer: Self-pay | Admitting: Obstetrics and Gynecology

## 2019-05-21 MED ORDER — LORAZEPAM 1 MG PO TABS: ORAL_TABLET | ORAL | 0 refills | Status: DC

## 2019-05-21 NOTE — Telephone Encounter (Signed)
Dr. Quincy Simmonds -please review and advise on Rx.

## 2019-05-21 NOTE — Telephone Encounter (Signed)
Spoke with Manus Gunning at [facility]. Was advised ok to proceed with MRI of breast with metal rod in her arm.    Routing to Dr. Quincy Simmonds

## 2019-05-21 NOTE — Telephone Encounter (Signed)
Ok for Ativan 1 mg orally one hour prior to procedure. She will need a driver.

## 2019-05-21 NOTE — Telephone Encounter (Signed)
Patient is having breast MRI done tomorrow 05/22/19, states was told by Dr. Quincy Simmonds that if needed, she could call in something for anxiety for patient prior to MRI. Patient believes she does need this. [employer] on file.

## 2019-05-21 NOTE — Telephone Encounter (Signed)
Please check to see if she can proceed with the MRI.  I think I just saw something in her chart about a metal rod in her arm.  I believe this will preclude her from proceeding with the MRI.

## 2019-05-21 NOTE — Telephone Encounter (Signed)
Spoke with patient, advised per Dr. Silva. Patient verbalizes understanding and is agreeable.  Encounter closed.  

## 2019-05-22 ENCOUNTER — Other Ambulatory Visit: Payer: Self-pay

## 2019-05-22 ENCOUNTER — Ambulatory Visit
Admission: RE | Admit: 2019-05-22 | Discharge: 2019-05-22 | Disposition: A | Discharge status: Home / Self Care | Payer: Medicare HMO | Source: Ambulatory Visit | Attending: Obstetrics and Gynecology | Admitting: Obstetrics and Gynecology

## 2019-05-22 DIAGNOSIS — Z1231 Encounter for screening mammogram for malignant neoplasm of breast: Secondary | ICD-10-CM

## 2019-05-22 DIAGNOSIS — Z803 Family history of malignant neoplasm of breast: Secondary | ICD-10-CM | POA: Diagnosis not present

## 2019-05-22 MED ORDER — GADOBUTROL 1 MMOL/ML IV SOLN
5.0000 mL | Freq: Once | INTRAVENOUS | Status: AC | PRN
  Administered 2019-05-22: 5 mL via INTRAVENOUS

## 2019-06-04 ENCOUNTER — Ambulatory Visit: Payer: Medicare HMO | Admitting: Family Medicine

## 2019-06-04 ENCOUNTER — Encounter: Payer: Self-pay | Admitting: Family Medicine

## 2019-06-04 ENCOUNTER — Other Ambulatory Visit: Payer: Self-pay

## 2019-06-04 ENCOUNTER — Ambulatory Visit: Payer: Self-pay

## 2019-06-04 VITALS — BP 124/78 | HR 86 | Ht 66.5 in | Wt 128.0 lb

## 2019-06-04 DIAGNOSIS — M6788 Other specified disorders of synovium and tendon, other site: Secondary | ICD-10-CM

## 2019-06-04 MED ORDER — NITROGLYCERIN 0.2 MG/HR TD PT24: MEDICATED_PATCH | TRANSDERMAL | 1 refills | Status: DC

## 2019-06-04 NOTE — Progress Notes (Signed)
Subjective:    I'm seeing this patient as a consultation for:  Dr. Ronnald Ramp. Note will be routed back to referring provider/PCP.  CC: B Achille's tendon pain  I, Molly Weber, LAT, ATC, am serving as scribe for Dr. Lynne Leader.  HPI: Pt is a 69 y/o female presenting w/ c/o B Achille's tendon pain x years that has progressively worsened this year due to increased walking.  Pt rates her pain at a 3-4/10 and describes her pain as aching tenderness .  She denies any injury.  Radiating pain: No Swelling: Yes.  Visual bump noted at distal Achille's Numbness/tingling: No Aggravating factors: Prolonged walking Treatments tried: Mg rub (Body Balm); Voltaren gel; ice; rolls her feet; foam rolling  Past medical history, Surgical history, Family history, Social history, Allergies, and medications have been entered into the medical record, reviewed.   Review of Systems: No new headache, visual changes, nausea, vomiting, diarrhea, constipation, dizziness, abdominal pain, skin rash, fevers, chills, night sweats, weight loss, swollen lymph nodes, body aches, joint swelling, muscle aches, chest pain, shortness of breath, mood changes, visual or auditory hallucinations.   Objective:    Vitals:   06/04/19 1446  BP: 124/78  Pulse: 86  SpO2: 97%   General: Well Developed, well nourished, and in no acute distress.  Neuro/Psych: Alert and oriented x3, extra-ocular muscles intact, able to move all 4 extremities, sensation grossly intact. Skin: Warm and dry, no rashes noted.  Respiratory: Not using accessory muscles, speaking in full sentences, trachea midline.  Cardiovascular: Pulses palpable, no extremity edema. Abdomen: Does not appear distended. MSK:  Right ankle: Visible nodule at distal Achilles tendon.  Otherwise foot ankle normal-appearing. Normal foot and ankle motion.  Pulses cap refill sensation intact distally.  Normal strength.  Left ankle: Visible nodule at distal Achilles tendon.   Otherwise foot and ankle normal-appearing.  Normal Flamenco motion.  Pulses capillary refill and sensation intact distally.  Normal strength.  Lab and Radiology Results  Limited musculoskeletal ultrasound right Achilles tendon. Increased tendon thickness of the distal Achilles tendon approximately 2 cm proximal to insertion of calcaneus.  Thickness reaches 1 cm at maximum diameter.  Achilles tendon measures 0.4 cm at insertion on calcaneus.  No visible tearing seen.  No increased vascular activity.  Slight hypoechoic change diffusely through Achilles tendon.  Limited musculoskeletal ultrasound left Achilles tendon. Increased tendon thickness at distal Achilles tendon approximately 2 cm proximal to insertion on calcaneus.  Diameter reaches maximum of 1 cm.  Achilles tendon measures 0.4 cm at insertion on calcaneus.  No visible tendon defect present.  No increased vascular activity.  Slight hypoechoic change diffusely circulation at maximum diameter area.  Impression: Distal Achilles tendon nodule with tendinosis without tear.  Impression and Recommendations:    Assessment and Plan: 69 y.o. female with chronic Achilles tendinosis bilaterally slightly worsening recently.  Patient has tender nodules as well seen on physical exam and ultrasound examination.  Discussed treatment plan and options.  We will proceed with eccentric exercises and nitroglycerin patch protocol.  Check back in 6 weeks.  Return sooner if needed.  Precautions reviewed.  PDMP not reviewed this encounter. Orders Placed This Encounter  Procedures  . [place] LIMITED JOINT SPACE STRUCTURES LOW BILAT(NO LINKED CHARGES)    Order Specific Question:   Reason for Exam (SYMPTOM  OR DIAGNOSIS REQUIRED)    Answer:   B Achille's pain    Order Specific Question:   Preferred imaging location?    Answer:   [facility]   Meds ordered this encounter  Medications  . nitroGLYCERIN (NITRODUR - DOSED IN MG/24 HR) 0.2 mg/hr  patch    Sig: Apply 1/4 patch daily to Achilles  tendon for tendonitis.    Dispense:  30 patch    Refill:  1    Discussed warning signs or symptoms. Please see discharge instructions. Patient expresses understanding.   The above documentation has been reviewed and is accurate and complete Lynne Leader

## 2019-06-04 NOTE — Patient Instructions (Signed)
Thank you for coming in today.  Start nitropatches with the exercise.  Remember to go from up to down slowly.  30 reps 2-3x daily.  Recheck in 6 weeks.  Return sooner if needed.   Nitroglycerin Protocol   Apply 1/4 nitroglycerin patch to affected area daily.  Change position of patch within the affected area every 24 hours.  You may experience a headache during the first 1-2 weeks of using the patch, these should subside.  If you experience headaches after beginning nitroglycerin patch treatment, you may take your preferred over the counter pain reliever.  Another side effect of the nitroglycerin patch is skin irritation or rash related to patch adhesive.  Please notify our office if you develop more severe headaches or rash, and stop the patch.  Tendon healing with nitroglycerin patch may require 12 to 24 weeks depending on the extent of injury.  Men should not use if taking Viagra, Cialis, or Levitra.   Do not use if you have migraines or rosacea.

## 2019-06-05 DIAGNOSIS — R69 Illness, unspecified: Secondary | ICD-10-CM | POA: Diagnosis not present

## 2019-06-10 ENCOUNTER — Other Ambulatory Visit: Payer: Self-pay | Admitting: Internal Medicine

## 2019-06-19 DIAGNOSIS — L821 Other seborrheic keratosis: Secondary | ICD-10-CM | POA: Diagnosis not present

## 2019-06-19 DIAGNOSIS — L570 Actinic keratosis: Secondary | ICD-10-CM | POA: Diagnosis not present

## 2019-06-19 DIAGNOSIS — L814 Other melanin hyperpigmentation: Secondary | ICD-10-CM | POA: Diagnosis not present

## 2019-06-19 DIAGNOSIS — D2262 Melanocytic nevi of left upper limb, including shoulder: Secondary | ICD-10-CM | POA: Diagnosis not present

## 2019-06-19 DIAGNOSIS — D485 Neoplasm of uncertain behavior of skin: Secondary | ICD-10-CM | POA: Diagnosis not present

## 2019-06-19 DIAGNOSIS — C44612 Basal cell carcinoma of skin of right upper limb, including shoulder: Secondary | ICD-10-CM | POA: Diagnosis not present

## 2019-06-19 DIAGNOSIS — D225 Melanocytic nevi of trunk: Secondary | ICD-10-CM | POA: Diagnosis not present

## 2019-06-19 DIAGNOSIS — D2271 Melanocytic nevi of right lower limb, including hip: Secondary | ICD-10-CM | POA: Diagnosis not present

## 2019-06-19 DIAGNOSIS — D2272 Melanocytic nevi of left lower limb, including hip: Secondary | ICD-10-CM | POA: Diagnosis not present

## 2019-06-19 DIAGNOSIS — Z85828 Personal history of other malignant neoplasm of skin: Secondary | ICD-10-CM | POA: Diagnosis not present

## 2019-06-19 DIAGNOSIS — D2261 Melanocytic nevi of right upper limb, including shoulder: Secondary | ICD-10-CM | POA: Diagnosis not present

## 2019-06-21 DIAGNOSIS — H524 Presbyopia: Secondary | ICD-10-CM | POA: Diagnosis not present

## 2019-06-21 DIAGNOSIS — H2513 Age-related nuclear cataract, bilateral: Secondary | ICD-10-CM | POA: Diagnosis not present

## 2019-06-25 ENCOUNTER — Other Ambulatory Visit: Payer: Self-pay

## 2019-06-25 ENCOUNTER — Other Ambulatory Visit: Payer: Medicare HMO

## 2019-06-25 ENCOUNTER — Encounter: Payer: Self-pay | Admitting: Internal Medicine

## 2019-06-25 ENCOUNTER — Ambulatory Visit (INDEPENDENT_AMBULATORY_CARE_PROVIDER_SITE_OTHER): Payer: Medicare HMO | Admitting: Internal Medicine

## 2019-06-25 VITALS — BP 126/78 | HR 92 | Temp 98.5°F | Resp 16 | Ht 66.5 in | Wt 128.1 lb

## 2019-06-25 DIAGNOSIS — I10 Essential (primary) hypertension: Secondary | ICD-10-CM

## 2019-06-25 LAB — BASIC METABOLIC PANEL
BUN: 12 mg/dL (ref 6–23)
CO2: 29 mEq/L (ref 19–32)
Calcium: 9.3 mg/dL (ref 8.4–10.5)
Chloride: 105 mEq/L (ref 96–112)
Creatinine, Ser: 0.83 mg/dL (ref 0.40–1.20)
GFR: 68.74 mL/min (ref >60.00)
Glucose, Bld: 110 mg/dL — ABNORMAL HIGH (ref 70–99)
Potassium: 4.2 mEq/L (ref 3.5–5.1)
Sodium: 140 mEq/L (ref 135–145)

## 2019-06-25 NOTE — Progress Notes (Signed)
Subjective:  Patient ID: Diane Day, female    DOB: 04/02/1951  Age: 69 y.o. MRN: [id number]  CC: Hypertension   This visit occurred during the SARS-CoV-2 public health emergency.  Safety protocols were in place, including screening questions prior to the visit, additional usage of staff PPE, and extensive cleaning of exam room while observing appropriate contact time as indicated for disinfecting solutions.    HPI ANGELROSE FAGUNDO presents for f/up - She tells me her blood pressure has been well controlled.  She is very active and denies any recent episodes of dizziness, lightheadedness, chest pain, shortness of breath, or edema.  Outpatient Medications Prior to Visit  Medication Sig Dispense Refill  . aspirin (ASPIRIN 81) 81 MG EC tablet Take 1 tablet by mouth daily.    . calcium carbonate (TUMS - DOSED IN MG ELEMENTAL CALCIUM) 500 MG chewable tablet Chew 1 tablet by mouth as needed.     . Cholecalciferol (VITAMIN D3) 2000 UNITS TABS Take by mouth every morning.      . indapamide (LOZOL) 1.25 MG tablet Take 1 tablet (1.25 mg total) by mouth daily. 90 tablet 0  . LORazepam (ATIVAN) 1 MG tablet Take 1 tablet one hour prior to procedure. 1 tablet 0  . Multiple Vitamin (MULTIVITAMIN) tablet Take 1 tablet by mouth daily.    . nitroGLYCERIN (NITRODUR - DOSED IN MG/24 HR) 0.2 mg/hr patch Apply 1/4 patch daily to Achilles  tendon for tendonitis. 30 patch 1  . psyllium (METAMUCIL) 58.6 % powder Take 1 packet by mouth daily.    . raloxifene (EVISTA) 60 MG tablet Take 1 tablet (60 mg total) by mouth daily. 30 tablet 11  . telmisartan (MICARDIS) 40 MG tablet TAKE 1 TABLET ONCE DAILY. 90 tablet 0   No facility-administered medications prior to visit.    ROS Review of Systems  All other systems reviewed and are negative.   Objective:  BP 126/78 (BP Location: Left Arm, Patient Position: Sitting, Cuff Size: Normal)   Pulse 92   Temp 98.5 F (36.9 C) (Oral)   Resp 16   Ht 5'  6.5" (1.689 m)   Wt 128 lb 2 oz (58.1 kg)   LMP 05/16/2006   SpO2 97%   BMI 20.37 kg/m   BP Readings from Last 3 Encounters:  06/25/19 126/78  06/04/19 124/78  04/23/19 (!) 154/100    Wt Readings from Last 3 Encounters:  06/25/19 128 lb 2 oz (58.1 kg)  06/04/19 128 lb (58.1 kg)  04/23/19 129 lb (58.5 kg)    Physical Exam Vitals reviewed.  Constitutional:      Appearance: Normal appearance.  HENT:     Mouth/Throat:     Mouth: Mucous membranes are moist.  Eyes:     General: No scleral icterus. Cardiovascular:     Rate and Rhythm: Normal rate and regular rhythm.     Heart sounds: No murmur.  Pulmonary:     Effort: Pulmonary effort is normal.     Breath sounds: No stridor. No wheezing, rhonchi or rales.  Abdominal:     General: Abdomen is flat. Bowel sounds are normal. There is no distension.     Palpations: Abdomen is soft. There is no hepatomegaly or splenomegaly.     Tenderness: There is no abdominal tenderness.  Musculoskeletal:        General: Normal range of motion.     Cervical back: Neck supple.     Right lower leg: No edema.  Left lower leg: No edema.  Lymphadenopathy:     Cervical: No cervical adenopathy.  Skin:    General: Skin is warm and dry.  Neurological:     General: No focal deficit present.     Mental Status: She is alert.  Psychiatric:        Mood and Affect: Mood normal.        Behavior: Behavior normal.     Lab Results  Component Value Date   WBC 4.1 04/23/2019   HGB 13.8 04/23/2019   HCT 40.8 04/23/2019   PLT 207.0 04/23/2019   GLUCOSE 110 (H) 06/25/2019   CHOL 181 04/23/2019   TRIG 45.0 04/23/2019   HDL 83.90 04/23/2019   LDLCALC 88 04/23/2019   ALT 16 04/23/2019   AST 20 04/23/2019   NA 140 06/25/2019   K 4.2 06/25/2019   CL 105 06/25/2019   CREATININE 0.83 06/25/2019   BUN 12 06/25/2019   CO2 29 06/25/2019   TSH 1.09 04/23/2019   INR 1.1 (H) 04/23/2019   HGBA1C 5.6 11/20/2014    MR BREAST BILATERAL W WO CONTRAST  INC CAD  Result Date: 05/22/2019 CLINICAL DATA:  69 year old female presenting for high-risk screening MRI. Strong family history of breast cancer. LABS:  None performed on site. EXAM: BILATERAL BREAST MRI WITH AND WITHOUT CONTRAST TECHNIQUE: Multiplanar, multisequence MR images of both breasts were obtained prior to and following the intravenous administration of 5 ml of Gadavist. Three-dimensional MR images were rendered by post-processing of the original MR data on an independent workstation. The three-dimensional MR images were interpreted, and findings are reported in the following complete MRI report for this study. Three dimensional images were evaluated at the independent DynaCad workstation COMPARISON:  Previous exam(s). FINDINGS: Breast composition: b. Scattered fibroglandular tissue. Background parenchymal enhancement: Mild. Right breast: No mass or abnormal enhancement. Left breast: No mass or abnormal enhancement. Lymph nodes: No abnormal appearing lymph nodes. Ancillary findings:  None. IMPRESSION: No MRI evidence of malignancy in either breast. RECOMMENDATION: Routine annual screening with mammography and breast MRI. The patient is due for her next screening mammogram in October 2021. BI-RADS CATEGORY  1: Negative. Electronically Signed   By: Kristopher Oppenheim M.D.   On: 05/22/2019 11:10    Assessment & Plan:   Britt was seen today for hypertension.  Diagnoses and all orders for this visit:  Essential hypertension- Her blood pressure is well controlled.  Electrolytes and renal function are normal.  Will continue the current combination of indapamide and telmisartan. -     Basic metabolic panel; Future   I am having Diane Day "Kathlee Nations" maintain her calcium carbonate, Vitamin D3, multivitamin, aspirin, psyllium, raloxifene, indapamide, LORazepam, nitroGLYCERIN, and telmisartan.  No orders of the defined types were placed in this encounter.    Follow-up: Return in about 6  months (around 12/23/2019).  Scarlette Calico, MD

## 2019-06-25 NOTE — Patient Instructions (Signed)

## 2019-07-14 ENCOUNTER — Ambulatory Visit: Payer: Medicare HMO

## 2019-07-15 ENCOUNTER — Other Ambulatory Visit: Payer: Self-pay | Admitting: Internal Medicine

## 2019-07-15 DIAGNOSIS — I10 Essential (primary) hypertension: Secondary | ICD-10-CM

## 2019-07-23 ENCOUNTER — Encounter: Payer: Self-pay | Admitting: Family Medicine

## 2019-07-23 ENCOUNTER — Ambulatory Visit: Payer: Medicare HMO | Admitting: Family Medicine

## 2019-07-23 ENCOUNTER — Other Ambulatory Visit: Payer: Self-pay

## 2019-07-23 ENCOUNTER — Ambulatory Visit: Payer: Self-pay

## 2019-07-23 VITALS — BP 130/78 | HR 74 | Ht 66.5 in | Wt 129.2 lb

## 2019-07-23 DIAGNOSIS — M7661 Achilles tendinitis, right leg: Secondary | ICD-10-CM | POA: Diagnosis not present

## 2019-07-23 DIAGNOSIS — M6788 Other specified disorders of synovium and tendon, other site: Secondary | ICD-10-CM | POA: Diagnosis not present

## 2019-07-23 DIAGNOSIS — M7662 Achilles tendinitis, left leg: Secondary | ICD-10-CM | POA: Diagnosis not present

## 2019-07-23 NOTE — Progress Notes (Signed)
   I, Wendy Poet, LAT, ATC, am serving as scribe for Dr. Lynne Leader.  Diane Day is a 69 y.o. female who presents to [place] at [facility] today for f/u of her B Achille's pain.  She was last seen by Dr. Georgina Snell on 06/04/19 and was prescribed nitroglycerin patches and a HEP consisting of Alfredson's exercises.  She states that her symptoms remain unchanged.  She states that she was not able to tolerate the nitroglycerin patches due to headaches.  She con't to do the Alfredson's exercises daily for 45 reps/day.   Pertinent review of systems: No fevers or chills  Relevant historical information: Hypertension   Exam:  BP 130/78 (BP Location: Left Arm, Patient Position: Sitting, Cuff Size: Normal)   Pulse 74   Ht 5' 6.5" (1.689 m)   Wt 129 lb 3.2 oz (58.6 kg)   LMP 05/16/2006   SpO2 99%   BMI 20.54 kg/m  General: Well Developed, well nourished, and in no acute distress.   MSK: Achilles tendon bilaterally have palpable nodules about 2 to 3 cm proximal to insertion on the calcaneus.  Mildly tender to palpation.  Normal ankle motion and strength.    Lab and Radiology Results  Diagnostic Limited MSK Ultrasound of: Distal Achilles tendon bilaterally Unchanged appearance of Achilles tendon nodules.  Fibers are intact with no significant disruption.  Hypoechoic fluid tracking throughout tendon nodule which measures about 1 cm in diameter. Impression: Achilles tendon nodule     Assessment and Plan: 69 y.o. female with Achilles tendinitis with nodules bilaterally.  Unchanged to slightly improved.  Patient cannot tolerate nitroglycerin patch protocol but is improving slightly with home exercise program.  Plan to continue eccentric exercises and advance activity as tolerated.  Recheck back in 3 months.  Return sooner if needed.  Precautions reviewed.   PDMP not reviewed this encounter. Orders Placed This Encounter  Procedures  . [place] LIMITED JOINT SPACE  STRUCTURES LOW BILAT(NO LINKED CHARGES)    Order Specific Question:   Reason for Exam (SYMPTOM  OR DIAGNOSIS REQUIRED)    Answer:   eval AT    Order Specific Question:   Preferred imaging location?    Answer:   [place]   No orders of the defined types were placed in this encounter.    Discussed warning signs or symptoms. Please see discharge instructions. Patient expresses understanding.   The above documentation has been reviewed and is accurate and complete Lynne Leader

## 2019-07-23 NOTE — Patient Instructions (Addendum)
Thank you for coming in today. Continue the exercises.  Use voltaren gel up to 4x daily. Recheck with me in 3 months.  30 reps 2-3x daily.

## 2019-09-05 DIAGNOSIS — Z85828 Personal history of other malignant neoplasm of skin: Secondary | ICD-10-CM | POA: Diagnosis not present

## 2019-09-05 DIAGNOSIS — L905 Scar conditions and fibrosis of skin: Secondary | ICD-10-CM | POA: Diagnosis not present

## 2019-09-05 DIAGNOSIS — D485 Neoplasm of uncertain behavior of skin: Secondary | ICD-10-CM | POA: Diagnosis not present

## 2019-10-08 ENCOUNTER — Encounter: Payer: Self-pay | Admitting: Internal Medicine

## 2019-10-15 ENCOUNTER — Other Ambulatory Visit: Payer: Self-pay | Admitting: Internal Medicine

## 2019-10-23 ENCOUNTER — Ambulatory Visit: Payer: Medicare HMO | Admitting: Family Medicine

## 2019-10-23 ENCOUNTER — Other Ambulatory Visit: Payer: Self-pay

## 2019-10-23 ENCOUNTER — Encounter: Payer: Self-pay | Admitting: Family Medicine

## 2019-10-23 VITALS — BP 120/78 | HR 93 | Ht 66.5 in | Wt 130.0 lb

## 2019-10-23 DIAGNOSIS — M7661 Achilles tendinitis, right leg: Secondary | ICD-10-CM | POA: Diagnosis not present

## 2019-10-23 DIAGNOSIS — M25511 Pain in right shoulder: Secondary | ICD-10-CM

## 2019-10-23 DIAGNOSIS — M7662 Achilles tendinitis, left leg: Secondary | ICD-10-CM

## 2019-10-23 NOTE — Progress Notes (Signed)
   Rito Ehrlich, am serving as a Education administrator for Dr. Lynne Leader.  Diane Day is a 69 y.o. female who presents to [place] at [facility] today for f/u of B Achille's pain.  She was last seen by Dr. Georgina Snell on 07/23/19 for f/u and was consistently performing her Alfredson's exercises.  She attempted to use the nitroglycerin patches but was unable due to side effect of HA.  Since her last visit, pt reports the pain is manageable, but not sure it changed much.   Additionally she notes right shoulder pain.  She has been swimming a lot for exercise as she cannot run or walk as much as she would due to her Achilles tendinitis.  She had similar problems in her left shoulder that resolved with physical therapy at [facility] and she like to consider that again.  She denies any acute injury.  Pain is been ongoing for few months now.  Pertinent review of systems: No fevers or chills  Relevant historical information: Hypertension   Exam:  BP 120/78 (BP Location: Left Arm, Patient Position: Sitting, Cuff Size: Normal)   Pulse 93   Ht 5' 6.5" (1.689 m)   Wt 130 lb (59 kg)   LMP 05/16/2006   SpO2 99%   BMI 20.67 kg/m  General: Well Developed, well nourished, and in no acute distress.   MSK: Achilles bilaterally with visible nodules.  Normal motion.  Right shoulder normal-appearing nontender. Full range of motion pain with abduction. Strength 4/5 abduction and external rotation.  5/5 internal rotation. Positive empty can test. Negative Hawkins and Neer's test. Negative Yergason's and speeds test.      Assessment and Plan: 69 y.o. female with right shoulder pain rotator cuff tendinopathy due to swimming.  Plan to modify swimming a bit and proceed with physical therapy.  New referral placed today.  Recheck back in 6 weeks if not improved.  Bilateral Achilles tendon nodules.  Discussed that there is not much to do at this point which is going to be more helpful  than it is obnoxious.  There is certainly reasonable to consider PRP injections or even surgery consultation however her symptoms are not bad enough to warrant it.  Plan to continue activity modification and home exercise program and recheck for this issue as needed.    Orders Placed This Encounter  Procedures  . Ambulatory referral to Physical Therapy    Referral Priority:   Routine    Referral Type:   Physical Medicine    Referral Reason:   Specialty Services Required    Requested Specialty:   Physical Therapy    Number of Visits Requested:   1   No orders of the defined types were placed in this encounter.    Discussed warning signs or symptoms. Please see discharge instructions. Patient expresses understanding.   The above documentation has been reviewed and is accurate and complete Lynne Leader, M.D.

## 2019-10-23 NOTE — Patient Instructions (Signed)
Thank you for coming in today. Let me know if you have a problem.  Plan for PT shoulder  Recheck in about 6 weeks if not better.

## 2019-10-24 ENCOUNTER — Encounter: Payer: Self-pay | Admitting: Family Medicine

## 2019-11-01 DIAGNOSIS — M25511 Pain in right shoulder: Secondary | ICD-10-CM | POA: Diagnosis not present

## 2019-11-15 DIAGNOSIS — M25511 Pain in right shoulder: Secondary | ICD-10-CM | POA: Diagnosis not present

## 2019-12-05 DIAGNOSIS — R69 Illness, unspecified: Secondary | ICD-10-CM | POA: Diagnosis not present

## 2019-12-12 DIAGNOSIS — R69 Illness, unspecified: Secondary | ICD-10-CM | POA: Diagnosis not present

## 2019-12-23 DIAGNOSIS — Z791 Long term (current) use of non-steroidal anti-inflammatories (NSAID): Secondary | ICD-10-CM | POA: Diagnosis not present

## 2019-12-23 DIAGNOSIS — Z803 Family history of malignant neoplasm of breast: Secondary | ICD-10-CM | POA: Diagnosis not present

## 2019-12-23 DIAGNOSIS — Z833 Family history of diabetes mellitus: Secondary | ICD-10-CM | POA: Diagnosis not present

## 2019-12-23 DIAGNOSIS — Z87891 Personal history of nicotine dependence: Secondary | ICD-10-CM | POA: Diagnosis not present

## 2019-12-23 DIAGNOSIS — R32 Unspecified urinary incontinence: Secondary | ICD-10-CM | POA: Diagnosis not present

## 2019-12-23 DIAGNOSIS — I10 Essential (primary) hypertension: Secondary | ICD-10-CM | POA: Diagnosis not present

## 2019-12-23 DIAGNOSIS — Z85828 Personal history of other malignant neoplasm of skin: Secondary | ICD-10-CM | POA: Diagnosis not present

## 2019-12-23 DIAGNOSIS — Z79810 Long term (current) use of selective estrogen receptor modulators (SERMs): Secondary | ICD-10-CM | POA: Diagnosis not present

## 2019-12-23 DIAGNOSIS — M810 Age-related osteoporosis without current pathological fracture: Secondary | ICD-10-CM | POA: Diagnosis not present

## 2019-12-23 DIAGNOSIS — Z8249 Family history of ischemic heart disease and other diseases of the circulatory system: Secondary | ICD-10-CM | POA: Diagnosis not present

## 2019-12-23 DIAGNOSIS — Z008 Encounter for other general examination: Secondary | ICD-10-CM | POA: Diagnosis not present

## 2019-12-24 ENCOUNTER — Other Ambulatory Visit: Payer: Self-pay

## 2019-12-24 ENCOUNTER — Encounter: Payer: Self-pay | Admitting: Internal Medicine

## 2019-12-24 ENCOUNTER — Ambulatory Visit (INDEPENDENT_AMBULATORY_CARE_PROVIDER_SITE_OTHER): Payer: Medicare HMO | Admitting: Internal Medicine

## 2019-12-24 VITALS — BP 134/78 | HR 67 | Temp 98.1°F | Resp 16 | Ht 66.5 in | Wt 128.0 lb

## 2019-12-24 DIAGNOSIS — I10 Essential (primary) hypertension: Secondary | ICD-10-CM | POA: Diagnosis not present

## 2019-12-24 NOTE — Patient Instructions (Signed)

## 2019-12-24 NOTE — Progress Notes (Signed)
Subjective:  Patient ID: Diane Day, female    DOB: [birth year]  Age: 69 y.o. MRN: [id number]  CC: Hypertension  This visit occurred during the SARS-CoV-2 public health emergency.  Safety protocols were in place, including screening questions prior to the visit, additional usage of staff PPE, and extensive cleaning of exam room while observing appropriate contact time as indicated for disinfecting solutions.    HPI Diane Day presents for f/up -She is very active and denies any recent episodes of chest pain, shortness of breath, palpitations, edema, or fatigue.  She tells me her blood pressure has been well controlled.  Outpatient Medications Prior to Visit  Medication Sig Dispense Refill   aspirin (ASPIRIN 81) 81 MG EC tablet Take 1 tablet by mouth daily.     calcium carbonate (TUMS - DOSED IN MG ELEMENTAL CALCIUM) 500 MG chewable tablet Chew 1 tablet by mouth as needed.      Cholecalciferol (VITAMIN D3) 2000 UNITS TABS Take by mouth every morning.       indapamide (LOZOL) 1.25 MG tablet TAKE 1 TABLET ONCE DAILY. 90 tablet 1   Multiple Vitamin (MULTIVITAMIN) tablet Take 1 tablet by mouth daily.     psyllium (METAMUCIL) 58.6 % powder Take 1 packet by mouth daily.     raloxifene (EVISTA) 60 MG tablet Take 1 tablet (60 mg total) by mouth daily. 30 tablet 11   telmisartan (MICARDIS) 40 MG tablet TAKE 1 TABLET ONCE DAILY. 90 tablet 0   LORazepam (ATIVAN) 1 MG tablet Take 1 tablet one hour prior to procedure. 1 tablet 0   nitroGLYCERIN (NITRODUR - DOSED IN MG/24 HR) 0.2 mg/hr patch Apply 1/4 patch daily to Achilles  tendon for tendonitis. (Patient not taking: Reported on 07/23/2019) 30 patch 1   No facility-administered medications prior to visit.    ROS Review of Systems  Constitutional: Negative for diaphoresis and fatigue.  HENT: Negative.   Eyes: Negative.   Respiratory: Negative for cough, chest tightness, shortness of breath and wheezing.     Cardiovascular: Negative for chest pain, palpitations and leg swelling.  Gastrointestinal: Negative for abdominal pain, diarrhea, nausea and vomiting.  Endocrine: Negative.   Genitourinary: Negative.   Musculoskeletal: Negative for arthralgias.  Skin: Negative.   Neurological: Negative for dizziness, weakness, light-headedness and headaches.  Hematological: Negative for adenopathy. Does not bruise/bleed easily.  Psychiatric/Behavioral: Negative.     Objective:  BP 134/78 (BP Location: Left Arm, Patient Position: Sitting, Cuff Size: Normal)    Pulse 67    Temp 98.1 F (36.7 C) (Oral)    Resp 16    Ht 5' 6.5" (1.689 m)    Wt 128 lb (58.1 kg)    LMP 05/16/2006    SpO2 99%    BMI 20.35 kg/m   BP Readings from Last 3 Encounters:  12/24/19 134/78  10/23/19 120/78  07/23/19 130/78    Wt Readings from Last 3 Encounters:  12/24/19 128 lb (58.1 kg)  10/23/19 130 lb (59 kg)  07/23/19 129 lb 3.2 oz (58.6 kg)    Physical Exam Vitals reviewed.  Constitutional:      Appearance: Normal appearance.  HENT:     Nose: Nose normal.     Mouth/Throat:     Mouth: Mucous membranes are moist.  Eyes:     General: No scleral icterus.    Conjunctiva/sclera: Conjunctivae normal.  Cardiovascular:     Rate and Rhythm: Normal rate and regular rhythm.     Heart sounds: No murmur  heard.   Pulmonary:     Effort: Pulmonary effort is normal.     Breath sounds: No stridor. No wheezing, rhonchi or rales.  Abdominal:     General: Abdomen is flat. Bowel sounds are normal. There is no distension.     Palpations: Abdomen is soft. There is no hepatomegaly, splenomegaly or mass.     Tenderness: There is no abdominal tenderness.  Musculoskeletal:        General: Normal range of motion.     Cervical back: Neck supple.     Right lower leg: No edema.     Left lower leg: No edema.  Lymphadenopathy:     Cervical: No cervical adenopathy.  Skin:    General: Skin is warm and dry.  Neurological:     General:  No focal deficit present.     Mental Status: She is alert.     Lab Results  Component Value Date   WBC 4.1 04/23/2019   HGB 13.8 04/23/2019   HCT 40.8 04/23/2019   PLT 207.0 04/23/2019   GLUCOSE 98 12/24/2019   CHOL 181 04/23/2019   TRIG 45.0 04/23/2019   HDL 83.90 04/23/2019   LDLCALC 88 04/23/2019   ALT 16 04/23/2019   AST 20 04/23/2019   NA 141 12/24/2019   K 4.9 12/24/2019   CL 105 12/24/2019   CREATININE 0.86 12/24/2019   BUN 13 12/24/2019   CO2 29 12/24/2019   TSH 1.09 04/23/2019   INR 1.1 (H) 04/23/2019   HGBA1C 5.6 11/20/2014    MR BREAST BILATERAL W WO CONTRAST INC CAD  Result Date: 05/22/2019 CLINICAL DATA:  69 year old female presenting for high-risk screening MRI. Strong family history of breast cancer. LABS:  None performed on site. EXAM: BILATERAL BREAST MRI WITH AND WITHOUT CONTRAST TECHNIQUE: Multiplanar, multisequence MR images of both breasts were obtained prior to and following the intravenous administration of 5 ml of Gadavist. Three-dimensional MR images were rendered by post-processing of the original MR data on an independent workstation. The three-dimensional MR images were interpreted, and findings are reported in the following complete MRI report for this study. Three dimensional images were evaluated at the independent DynaCad workstation COMPARISON:  Previous exam(s). FINDINGS: Breast composition: b. Scattered fibroglandular tissue. Background parenchymal enhancement: Mild. Right breast: No mass or abnormal enhancement. Left breast: No mass or abnormal enhancement. Lymph nodes: No abnormal appearing lymph nodes. Ancillary findings:  None. IMPRESSION: No MRI evidence of malignancy in either breast. RECOMMENDATION: Routine annual screening with mammography and breast MRI. The patient is due for her next screening mammogram in October 2021. BI-RADS CATEGORY  1: Negative. Electronically Signed   By: Kristopher Oppenheim M.D.   On: 05/22/2019 11:10    Assessment &  Plan:   Diane Day was seen today for hypertension.  Diagnoses and all orders for this visit:  Essential hypertension- Her blood pressure is adequately well controlled.  Electrolytes and renal function are normal.  Will continue the combination of an ARB and thiazide diuretic. -     BASIC METABOLIC PANEL WITH GFR; Future -     BASIC METABOLIC PANEL WITH GFR   I have discontinued Diane Day "Diane Day"'s LORazepam and nitroGLYCERIN. I am also having her maintain her calcium carbonate, Vitamin D3, multivitamin, aspirin, psyllium, raloxifene, indapamide, and telmisartan.  No orders of the defined types were placed in this encounter.    Follow-up: Return in about 6 months (around 06/25/2020).  Scarlette Calico, MD

## 2019-12-25 LAB — BASIC METABOLIC PANEL WITH GFR
BUN: 13 mg/dL (ref 7–25)
CO2: 29 mmol/L (ref 20–32)
Calcium: 9.6 mg/dL (ref 8.6–10.4)
Chloride: 105 mmol/L (ref 98–110)
Creat: 0.86 mg/dL (ref 0.50–0.99)
GFR, Est African American: 82 mL/min/{1.73_m2} (ref >=60)
GFR, Est Non African American: 70 mL/min/{1.73_m2} (ref >=60)
Glucose, Bld: 98 mg/dL (ref 65–99)
Potassium: 4.9 mmol/L (ref 3.5–5.3)
Sodium: 141 mmol/L (ref 135–146)

## 2020-01-15 ENCOUNTER — Other Ambulatory Visit: Payer: Self-pay | Admitting: Internal Medicine

## 2020-01-21 ENCOUNTER — Other Ambulatory Visit: Payer: Self-pay | Admitting: Internal Medicine

## 2020-01-21 DIAGNOSIS — I10 Essential (primary) hypertension: Secondary | ICD-10-CM

## 2020-02-03 ENCOUNTER — Telehealth: Payer: Self-pay | Admitting: Obstetrics and Gynecology

## 2020-02-03 NOTE — Telephone Encounter (Signed)
Left message on voicemail to call and reschedule cancelled appointment. °

## 2020-03-09 DIAGNOSIS — Z1231 Encounter for screening mammogram for malignant neoplasm of breast: Secondary | ICD-10-CM | POA: Diagnosis not present

## 2020-03-09 LAB — HM MAMMOGRAPHY

## 2020-03-24 DIAGNOSIS — R69 Illness, unspecified: Secondary | ICD-10-CM | POA: Diagnosis not present

## 2020-04-15 ENCOUNTER — Ambulatory Visit: Payer: Medicare HMO | Admitting: Obstetrics and Gynecology

## 2020-04-16 DIAGNOSIS — Z20822 Contact with and (suspected) exposure to covid-19: Secondary | ICD-10-CM | POA: Diagnosis not present

## 2020-04-16 DIAGNOSIS — Z03818 Encounter for observation for suspected exposure to other biological agents ruled out: Secondary | ICD-10-CM | POA: Diagnosis not present

## 2020-04-17 ENCOUNTER — Other Ambulatory Visit: Payer: Self-pay

## 2020-04-17 ENCOUNTER — Ambulatory Visit (INDEPENDENT_AMBULATORY_CARE_PROVIDER_SITE_OTHER): Payer: Medicare HMO | Admitting: Internal Medicine

## 2020-04-17 ENCOUNTER — Encounter: Payer: Self-pay | Admitting: Internal Medicine

## 2020-04-17 VITALS — BP 136/82 | HR 64 | Temp 98.8°F | Resp 16 | Wt 125.4 lb

## 2020-04-17 DIAGNOSIS — J0110 Acute frontal sinusitis, unspecified: Secondary | ICD-10-CM | POA: Diagnosis not present

## 2020-04-17 MED ORDER — AMOXICILLIN-POT CLAVULANATE 875-125 MG PO TABS: 1.0000 | ORAL_TABLET | Freq: Two times a day (BID) | ORAL | 0 refills | Status: AC

## 2020-04-17 MED ORDER — HYDROCODONE-HOMATROPINE 5-1.5 MG/5ML PO SYRP: 5.0000 mL | ORAL_SOLUTION | Freq: Three times a day (TID) | ORAL | 0 refills | Status: DC | PRN

## 2020-04-17 NOTE — Progress Notes (Signed)
Subjective:  Patient ID: Diane Day, female    DOB: 01/09/1951  Age: 69 y.o. MRN: [id number]  CC: Sinusitis  This visit occurred during the SARS-CoV-2 public health emergency.  Safety protocols were in place, including screening questions prior to the visit, additional usage of staff PPE, and extensive cleaning of exam room while observing appropriate contact time as indicated for disinfecting solutions.    HPI Diane Day presents for the complaint of a 1 week history of pain over her frontal sinuses, low-grade fever to 99.0, mild cough, sore throat, fatigue, and chills.  Her Covid test was -1-day prior to this visit.  Outpatient Medications Prior to Visit  Medication Sig Dispense Refill  . aspirin (ASPIRIN 81) 81 MG EC tablet Take 1 tablet by mouth daily.    . calcium carbonate (TUMS - DOSED IN MG ELEMENTAL CALCIUM) 500 MG chewable tablet Chew 1 tablet by mouth as needed.     . Cholecalciferol (VITAMIN D3) 2000 UNITS TABS Take by mouth every morning.      . indapamide (LOZOL) 1.25 MG tablet TAKE 1 TABLET ONCE DAILY. 90 tablet 1  . Multiple Vitamin (MULTIVITAMIN) tablet Take 1 tablet by mouth daily.    . psyllium (METAMUCIL) 58.6 % powder Take 1 packet by mouth daily.    . raloxifene (EVISTA) 60 MG tablet Take 1 tablet (60 mg total) by mouth daily. 30 tablet 11  . telmisartan (MICARDIS) 40 MG tablet TAKE 1 TABLET ONCE DAILY. 90 tablet 1   No facility-administered medications prior to visit.    ROS Review of Systems  Constitutional: Positive for chills and fever. Negative for diaphoresis and fatigue.  HENT: Positive for congestion, postnasal drip, rhinorrhea, sinus pressure, sinus pain and sore throat. Negative for facial swelling and trouble swallowing.   Respiratory: Positive for cough. Negative for chest tightness, shortness of breath and wheezing.   Cardiovascular: Negative for chest pain, palpitations and leg swelling.  Gastrointestinal: Negative for abdominal  pain, diarrhea, nausea and vomiting.  Genitourinary: Negative.   Musculoskeletal: Negative.  Negative for arthralgias and myalgias.  Skin: Negative for color change, pallor and rash.  Neurological: Negative for dizziness, weakness, light-headedness, numbness and headaches.  Hematological: Negative for adenopathy. Does not bruise/bleed easily.  Psychiatric/Behavioral: Negative.     Objective:  BP 136/82   Pulse 64   Temp 98.8 F (37.1 C) (Oral)   Resp 16   Wt 125 lb 6.4 oz (56.9 kg)   LMP 05/16/2006   SpO2 98%   BMI 19.94 kg/m   BP Readings from Last 3 Encounters:  04/17/20 136/82  12/24/19 134/78  10/23/19 120/78    Wt Readings from Last 3 Encounters:  04/17/20 125 lb 6.4 oz (56.9 kg)  12/24/19 128 lb (58.1 kg)  10/23/19 130 lb (59 kg)    Physical Exam Vitals reviewed.  Constitutional:      General: She is not in acute distress.    Appearance: Normal appearance. She is not ill-appearing, toxic-appearing or diaphoretic.  HENT:     Right Ear: Hearing, tympanic membrane, ear canal and external ear normal.     Left Ear: Hearing, tympanic membrane, ear canal and external ear normal.     Nose: Rhinorrhea present. No mucosal edema. Rhinorrhea is purulent.     Right Nostril: No epistaxis.     Left Nostril: No epistaxis.     Right Sinus: No maxillary sinus tenderness or frontal sinus tenderness.     Left Sinus: No maxillary sinus tenderness or  frontal sinus tenderness.     Mouth/Throat:     Mouth: Mucous membranes are moist.     Pharynx: Posterior oropharyngeal erythema present. No oropharyngeal exudate.  Eyes:     Conjunctiva/sclera: Conjunctivae normal.  Cardiovascular:     Rate and Rhythm: Normal rate and regular rhythm.     Heart sounds: No murmur heard.   Pulmonary:     Effort: Pulmonary effort is normal.     Breath sounds: No stridor. No wheezing, rhonchi or rales.  Abdominal:     General: Abdomen is flat. Bowel sounds are normal. There is no distension.      Palpations: Abdomen is soft. There is no hepatomegaly, splenomegaly or mass.     Tenderness: There is no abdominal tenderness.  Musculoskeletal:        General: Normal range of motion.     Cervical back: Neck supple.     Right lower leg: No edema.     Left lower leg: No edema.  Lymphadenopathy:     Cervical: No cervical adenopathy.  Skin:    General: Skin is warm and dry.     Coloration: Skin is not pale.  Neurological:     General: No focal deficit present.     Mental Status: She is alert.  Psychiatric:        Mood and Affect: Mood normal.        Behavior: Behavior normal.     Lab Results  Component Value Date   WBC 4.1 04/23/2019   HGB 13.8 04/23/2019   HCT 40.8 04/23/2019   PLT 207.0 04/23/2019   GLUCOSE 98 12/24/2019   CHOL 181 04/23/2019   TRIG 45.0 04/23/2019   HDL 83.90 04/23/2019   LDLCALC 88 04/23/2019   ALT 16 04/23/2019   AST 20 04/23/2019   NA 141 12/24/2019   K 4.9 12/24/2019   CL 105 12/24/2019   CREATININE 0.86 12/24/2019   BUN 13 12/24/2019   CO2 29 12/24/2019   TSH 1.09 04/23/2019   INR 1.1 (H) 04/23/2019   HGBA1C 5.6 11/20/2014    MR BREAST BILATERAL W WO CONTRAST INC CAD  Result Date: 05/22/2019 CLINICAL DATA:  69 year old female presenting for high-risk screening MRI. Strong family history of breast cancer. LABS:  None performed on site. EXAM: BILATERAL BREAST MRI WITH AND WITHOUT CONTRAST TECHNIQUE: Multiplanar, multisequence MR images of both breasts were obtained prior to and following the intravenous administration of 5 ml of Gadavist. Three-dimensional MR images were rendered by post-processing of the original MR data on an independent workstation. The three-dimensional MR images were interpreted, and findings are reported in the following complete MRI report for this study. Three dimensional images were evaluated at the independent DynaCad workstation COMPARISON:  Previous exam(s). FINDINGS: Breast composition: b. Scattered fibroglandular  tissue. Background parenchymal enhancement: Mild. Right breast: No mass or abnormal enhancement. Left breast: No mass or abnormal enhancement. Lymph nodes: No abnormal appearing lymph nodes. Ancillary findings:  None. IMPRESSION: No MRI evidence of malignancy in either breast. RECOMMENDATION: Routine annual screening with mammography and breast MRI. The patient is due for her next screening mammogram in October 2021. BI-RADS CATEGORY  1: Negative. Electronically Signed   By: Kristopher Oppenheim M.D.   On: 05/22/2019 11:10    Assessment & Plan:   Diane Day was seen today for sinusitis.  Diagnoses and all orders for this visit:  Acute non-recurrent frontal sinusitis -     amoxicillin-clavulanate (AUGMENTIN) 875-125 MG tablet; Take 1 tablet by mouth 2 (  two) times daily for 10 days. -     HYDROcodone-homatropine (HYCODAN) 5-1.5 MG/5ML syrup; Take 5 mLs by mouth every 8 (eight) hours as needed for cough.   I am having Diane Day "Diane Day" start on amoxicillin-clavulanate and HYDROcodone-homatropine. I am also having her maintain her calcium carbonate, Vitamin D3, multivitamin, aspirin, psyllium, raloxifene, telmisartan, and indapamide.  Meds ordered this encounter  Medications  . amoxicillin-clavulanate (AUGMENTIN) 875-125 MG tablet    Sig: Take 1 tablet by mouth 2 (two) times daily for 10 days.    Dispense:  20 tablet    Refill:  0  . HYDROcodone-homatropine (HYCODAN) 5-1.5 MG/5ML syrup    Sig: Take 5 mLs by mouth every 8 (eight) hours as needed for cough.    Dispense:  120 mL    Refill:  0     Follow-up: Return if symptoms worsen or fail to improve.  Scarlette Calico, MD

## 2020-04-17 NOTE — Patient Instructions (Signed)
Sinusitis, Adult Sinusitis is inflammation of your sinuses. Sinuses are hollow spaces in the bones around your face. Your sinuses are located:  Around your eyes.  In the middle of your forehead.  Behind your nose.  In your cheekbones. Mucus normally drains out of your sinuses. When your nasal tissues become inflamed or swollen, mucus can become trapped or blocked. This allows bacteria, viruses, and fungi to grow, which leads to infection. Most infections of the sinuses are caused by a virus. Sinusitis can develop quickly. It can last for up to 4 weeks (acute) or for more than 12 weeks (chronic). Sinusitis often develops after a cold. What are the causes? This condition is caused by anything that creates swelling in the sinuses or stops mucus from draining. This includes:  Allergies.  Asthma.  Infection from bacteria or viruses.  Deformities or blockages in your nose or sinuses.  Abnormal growths in the nose (nasal polyps).  Pollutants, such as chemicals or irritants in the air.  Infection from fungi (rare). What increases the risk? You are more likely to develop this condition if you:  Have a weak body defense system (immune system).  Do a lot of swimming or diving.  Overuse nasal sprays.  Smoke. What are the signs or symptoms? The main symptoms of this condition are pain and a feeling of pressure around the affected sinuses. Other symptoms include:  Stuffy nose or congestion.  Thick drainage from your nose.  Swelling and warmth over the affected sinuses.  Headache.  Upper toothache.  A cough that may get worse at night.  Extra mucus that collects in the throat or the back of the nose (postnasal drip).  Decreased sense of smell and taste.  Fatigue.  A fever.  Sore throat.  Bad breath. How is this diagnosed? This condition is diagnosed based on:  Your symptoms.  Your medical history.  A physical exam.  Tests to find out if your condition is  acute or chronic. This may include: ? Checking your nose for nasal polyps. ? Viewing your sinuses using a device that has a light (endoscope). ? Testing for allergies or bacteria. ? Imaging tests, such as an MRI or CT scan. In rare cases, a bone biopsy may be done to rule out more serious types of fungal sinus disease. How is this treated? Treatment for sinusitis depends on the cause and whether your condition is chronic or acute.  If caused by a virus, your symptoms should go away on their own within 10 days. You may be given medicines to relieve symptoms. They include: ? Medicines that shrink swollen nasal passages (topical intranasal decongestants). ? Medicines that treat allergies (antihistamines). ? A spray that eases inflammation of the nostrils (topical intranasal corticosteroids). ? Rinses that help get rid of thick mucus in your nose (nasal saline washes).  If caused by bacteria, your health care provider may recommend waiting to see if your symptoms improve. Most bacterial infections will get better without antibiotic medicine. You may be given antibiotics if you have: ? A severe infection. ? A weak immune system.  If caused by narrow nasal passages or nasal polyps, you may need to have surgery. Follow these instructions at home: Medicines  Take, use, or apply over-the-counter and prescription medicines only as told by your health care provider. These may include nasal sprays.  If you were prescribed an antibiotic medicine, take it as told by your health care provider. Do not stop taking the antibiotic even if you start   to feel better. Hydrate and humidify   Drink enough fluid to keep your urine pale yellow. Staying hydrated will help to thin your mucus.  Use a cool mist humidifier to keep the humidity level in your home above 50%.  Inhale steam for 10-15 minutes, 3-4 times a day, or as told by your health care provider. You can do this in the bathroom while a hot shower is  running.  Limit your exposure to cool or dry air. Rest  Rest as much as possible.  Sleep with your head raised (elevated).  Make sure you get enough sleep each night. General instructions   Apply a warm, moist washcloth to your face 3-4 times a day or as told by your health care provider. This will help with discomfort.  Wash your hands often with soap and water to reduce your exposure to germs. If soap and water are not available, use hand sanitizer.  Do not smoke. Avoid being around people who are smoking (secondhand smoke).  Keep all follow-up visits as told by your health care provider. This is important. Contact a health care provider if:  You have a fever.  Your symptoms get worse.  Your symptoms do not improve within 10 days. Get help right away if:  You have a severe headache.  You have persistent vomiting.  You have severe pain or swelling around your face or eyes.  You have vision problems.  You develop confusion.  Your neck is stiff.  You have trouble breathing. Summary  Sinusitis is soreness and inflammation of your sinuses. Sinuses are hollow spaces in the bones around your face.  This condition is caused by nasal tissues that become inflamed or swollen. The swelling traps or blocks the flow of mucus. This allows bacteria, viruses, and fungi to grow, which leads to infection.  If you were prescribed an antibiotic medicine, take it as told by your health care provider. Do not stop taking the antibiotic even if you start to feel better.  Keep all follow-up visits as told by your health care provider. This is important. This information is not intended to replace advice given to you by your health care provider. Make sure you discuss any questions you have with your health care provider. Document Revised: 10/02/2017 Document Reviewed: 10/02/2017 Elsevier Patient Education  2020 [employer]  

## 2020-04-18 MED ORDER — HYDROCOD POLST-CPM POLST ER 10-8 MG/5ML PO SUER: 5.0000 mL | Freq: Two times a day (BID) | ORAL | 0 refills | Status: DC | PRN

## 2020-04-18 NOTE — Addendum Note (Signed)
Addended by: Janith Lima on: 04/18/2020 01:16 PM   Modules accepted: Orders

## 2020-04-24 ENCOUNTER — Encounter: Payer: Self-pay | Admitting: Obstetrics and Gynecology

## 2020-04-24 ENCOUNTER — Other Ambulatory Visit: Payer: Self-pay | Admitting: Obstetrics and Gynecology

## 2020-04-24 ENCOUNTER — Other Ambulatory Visit: Payer: Self-pay

## 2020-04-24 ENCOUNTER — Ambulatory Visit (INDEPENDENT_AMBULATORY_CARE_PROVIDER_SITE_OTHER): Payer: Medicare HMO | Admitting: Obstetrics and Gynecology

## 2020-04-24 ENCOUNTER — Other Ambulatory Visit (HOSPITAL_COMMUNITY)
Admission: RE | Admit: 2020-04-24 | Discharge: 2020-04-24 | Disposition: A | Discharge status: Home / Self Care | Payer: Medicare HMO | Source: Ambulatory Visit | Attending: Obstetrics and Gynecology | Admitting: Obstetrics and Gynecology

## 2020-04-24 VITALS — BP 110/68 | HR 72 | Resp 16 | Ht 66.75 in | Wt 126.0 lb

## 2020-04-24 DIAGNOSIS — Z01419 Encounter for gynecological examination (general) (routine) without abnormal findings: Secondary | ICD-10-CM | POA: Diagnosis not present

## 2020-04-24 DIAGNOSIS — Z803 Family history of malignant neoplasm of breast: Secondary | ICD-10-CM | POA: Diagnosis not present

## 2020-04-24 DIAGNOSIS — Z9189 Other specified personal risk factors, not elsewhere classified: Secondary | ICD-10-CM

## 2020-04-24 LAB — HM PAP SMEAR

## 2020-04-24 NOTE — Progress Notes (Signed)
69 y.o. [id number] Married Caucasian female here for annual exam.    Some urinary incontinence with coughing and exercise.  Declines help with this.   Denies vaginal bleeding, spotting, and discharge.  3 sisters and mother have had breast cancer.  Has had genetic counseling but not testing.   Vaccinated with Covid booster.  Received her flu vaccine.   PCP:   Scarlette Calico, MD  [name] was 05/16/2006.           Sexually active: Yes.    [name]    Exercising: Yes.    daily Smoker:  no  Health Maintenance: Pap:  02/24/16 Neg:Neg HR HPV  11/30/12 ASCUS:Neg HR HPV History of abnormal Pap:  yes MMG:  03/09/20 BIRADS 1 negative/density b Colonoscopy:  08/29/12 Mild diverticulosis. Repeat 10 years BMD:   06/04/18  Result  Osteoporosis of hips and spine -- on Evista TDaP:  2014 Gardasil:   n/a HIV and Hep C: 03/22/16 Neg Screening Labs:  PCP   reports that she quit smoking about 45 years ago. Her smoking use included cigarettes. She has a 2.50 pack-year smoking history. She has never used smokeless tobacco. She reports current alcohol use of about 14.0 standard drinks of alcohol per week. She reports that she does not use drugs.  Past Medical History:  Diagnosis Date  . Cancer ([place])    basal cell, face  . Hypertension   . Osteopenia   . Osteoporosis   . Rectal bleeding     Past Surgical History:  Procedure Laterality Date  . BREAST SURGERY Left 1998   remove fibroadenoma  . KNEE ARTHROSCOPY Left   . repair broken arm  2008  . TUBAL LIGATION  1994    Current Outpatient Medications  Medication Sig Dispense Refill  . amoxicillin-clavulanate (AUGMENTIN) 875-125 MG tablet Take 1 tablet by mouth 2 (two) times daily for 10 days. 20 tablet 0  . aspirin 81 MG EC tablet Take 1 tablet by mouth daily.    . calcium carbonate (TUMS - DOSED IN MG ELEMENTAL CALCIUM) 500 MG chewable tablet Chew 1 tablet by  mouth as needed.    . chlorpheniramine-HYDROcodone (TUSSIONEX PENNKINETIC ER) 10-8 MG/5ML SUER Take 5 mLs by mouth every 12 (twelve) hours as needed for cough. 140 mL 0  . Cholecalciferol (VITAMIN D3) 2000 UNITS TABS Take by mouth every morning.    . indapamide (LOZOL) 1.25 MG tablet TAKE 1 TABLET ONCE DAILY. 90 tablet 1  . Multiple Vitamin (MULTIVITAMIN) tablet Take 1 tablet by mouth daily.    . psyllium (METAMUCIL) 58.6 % powder Take 1 packet by mouth daily.    . raloxifene (EVISTA) 60 MG tablet Take 1 tablet (60 mg total) by mouth daily. 30 tablet 11  . telmisartan (MICARDIS) 40 MG tablet TAKE 1 TABLET ONCE DAILY. 90 tablet 1   No current facility-administered medications for this visit.    Family History  Problem Relation Age of Onset  . Diabetes Father   . Heart disease Father   . Hypertension Father   . Esophageal cancer Father 33  . Breast cancer Mother 37  . Hypertension Mother   . Osteoporosis Mother   . Breast cancer Sister 60       BRCA negative  . Diabetes Sister   . Breast cancer Sister 34  . Cancer Paternal Aunt        unknown form of cancer over the age of 61  .  Thyroid cancer Cousin        maternal first cousin  . Breast cancer Cousin        died in her 18s; paternal cousin  . Breast cancer Cousin        paternal first cousin  . Breast cancer Sister   . Osteoporosis Brother   . Colon cancer Neg Hx   . Stomach cancer Neg Hx   . Rectal cancer Neg Hx     Review of Systems  Constitutional: Negative.   HENT: Negative.   Eyes: Negative.   Respiratory: Negative.   Cardiovascular: Negative.   Gastrointestinal: Negative.   Endocrine: Negative.   Genitourinary: Negative.   Musculoskeletal: Negative.   Skin: Negative.   Allergic/Immunologic: Negative.   Neurological: Negative.   Hematological: Negative.   Psychiatric/Behavioral: Negative.     Exam:   BP 110/68 (BP Location: Left Arm, Patient Position: Sitting, Cuff Size: Normal)   Pulse 72   Resp 16    Ht 5' 6.75" (1.695 m)   Wt 126 lb (57.2 kg)   LMP 05/16/2006   BMI 19.88 kg/m     General appearance: alert, cooperative and appears stated age Head: normocephalic, without obvious abnormality, atraumatic Neck: no adenopathy, supple, symmetrical, trachea midline and thyroid normal to inspection and palpation Lungs: clear to auscultation bilaterally Breasts: normal appearance, no masses or tenderness, No nipple retraction or dimpling, No nipple discharge or bleeding, No axillary adenopathy Heart: regular rate and rhythm Abdomen: soft, non-tender; no masses, no organomegaly Extremities: extremities normal, atraumatic, no cyanosis or edema Skin: skin color, texture, turgor normal. No rashes or lesions Lymph nodes: cervical, supraclavicular, and axillary nodes normal. Neurologic: grossly normal  Pelvic: External genitalia:  no lesions              No abnormal inguinal nodes palpated.              Urethra:  normal appearing urethra with no masses, tenderness or lesions              Bartholins and Skenes: normal                 Vagina: normal appearing vagina with normal color and discharge, no lesions              Cervix: no lesions              Pap taken: Yes.   Bimanual Exam:  Uterus:  normal size, contour, position, consistency, mobility, non-tender              Adnexa: no mass, fullness, tenderness              Rectal exam: Yes.  .  Confirms.              Anus:  normal sphincter tone, no lesions  Chaperone was present for exam.  Assessment:   Well woman visit with normal exam. Strong FH breast cancer.  Increased risk of breast cancer.  31 - 37%.    Has metal rod in her right arm.  Osteoporosis.Stable. On Evista. GSI.  Plan: Mammogram screening discussed. Self breast awareness reviewed. Pap and HR HPV as above. Guidelines for Calcium, Vitamin D, regular exercise program including cardiovascular and weight bearing exercise. BMD in January at [place].  Breast MRI in  January. Refill of Evista for one year.  Referral back to genetic specialist for counseling and testing.  I would recommend referral to medical oncology.  Kegel's.  She declines PT  referral or surgical care at this time. Follow up annually and prn.

## 2020-04-24 NOTE — Patient Instructions (Signed)
EXERCISE AND DIET:  We recommended that you start or continue a regular exercise program for good health. Regular exercise means any activity that makes your heart beat faster and makes you sweat.  We recommend exercising at least 30 minutes per day at least 3 days a week, preferably 4 or 5.  We also recommend a diet low in fat and sugar.  Inactivity, poor dietary choices and obesity can cause diabetes, heart attack, stroke, and kidney damage, among others.    ALCOHOL AND SMOKING:  Women should limit their alcohol intake to no more than 7 drinks/beers/glasses of wine (combined, not each!) per week. Moderation of alcohol intake to this level decreases your risk of breast cancer and liver damage. And of course, no recreational drugs are part of a healthy lifestyle.  And absolutely no smoking or even second hand smoke. Most people know smoking can cause heart and lung diseases, but did you know it also contributes to weakening of your bones? Aging of your skin?  Yellowing of your teeth and nails?  CALCIUM AND VITAMIN D:  Adequate intake of calcium and Vitamin D are recommended.  The recommendations for exact amounts of these supplements seem to change often, but generally speaking 600 mg of calcium (either carbonate or citrate) and 800 units of Vitamin D per day seems prudent. Certain women may benefit from higher intake of Vitamin D.  If you are among these women, your doctor will have told you during your visit.    PAP SMEARS:  Pap smears, to check for cervical cancer or precancers,  have traditionally been done yearly, although recent scientific advances have shown that most women can have pap smears less often.  However, every woman still should have a physical exam from her gynecologist every year. It will include a breast check, inspection of the vulva and vagina to check for abnormal growths or skin changes, a visual exam of the cervix, and then an exam to evaluate the size and shape of the uterus and  ovaries.  And after 67 years of age, a rectal exam is indicated to check for rectal cancers. We will also provide age appropriate advice regarding health maintenance, like when you should have certain vaccines, screening for sexually transmitted diseases, bone density testing, colonoscopy, mammograms, etc.   MAMMOGRAMS:  All women over 40 years old should have a yearly mammogram. Many facilities now offer a "3D" mammogram, which may cost around $50 extra out of pocket. If possible,  we recommend you accept the option to have the 3D mammogram performed.  It both reduces the number of women who will be called back for extra views which then turn out to be normal, and it is better than the routine mammogram at detecting truly abnormal areas.    COLONOSCOPY:  Colonoscopy to screen for colon cancer is recommended for all women at age 50.  We know, you hate the idea of the prep.  We agree, BUT, having colon cancer and not knowing it is worse!!  Colon cancer so often starts as a polyp that can be seen and removed at colonscopy, which can quite literally save your life!  And if your first colonoscopy is normal and you have no family history of colon cancer, most women don't have to have it again for 10 years.  Once every ten years, you can do something that may end up saving your life, right?  We will be happy to help you get it scheduled when you are ready.    Be sure to check your insurance coverage so you understand how much it will cost.  It may be covered as a preventative service at no cost, but you should check your particular policy.      Kegel Exercises  Kegel exercises can help strengthen your pelvic floor muscles. The pelvic floor is a group of muscles that support your rectum, small intestine, and bladder. In females, pelvic floor muscles also help support the womb (uterus). These muscles help you control the flow of urine and stool. Kegel exercises are painless and simple, and they do not require any  equipment. Your provider may suggest Kegel exercises to:  Improve bladder and bowel control.  Improve sexual response.  Improve weak pelvic floor muscles after surgery to remove the uterus (hysterectomy) or pregnancy (females).  Improve weak pelvic floor muscles after prostate gland removal or surgery (males). Kegel exercises involve squeezing your pelvic floor muscles, which are the same muscles you squeeze when you try to stop the flow of urine or keep from passing gas. The exercises can be done while sitting, standing, or lying down, but it is best to vary your position. Exercises How to do Kegel exercises: 1. Squeeze your pelvic floor muscles tight. You should feel a tight lift in your rectal area. If you are a female, you should also feel a tightness in your vaginal area. Keep your stomach, buttocks, and legs relaxed. 2. Hold the muscles tight for up to 10 seconds. 3. Breathe normally. 4. Relax your muscles. 5. Repeat as told by your health care provider. Repeat this exercise daily as told by your health care provider. Continue to do this exercise for at least 4-6 weeks, or for as long as told by your health care provider. You may be referred to a physical therapist who can help you learn more about how to do Kegel exercises. Depending on your condition, your health care provider may recommend:  Varying how long you squeeze your muscles.  Doing several sets of exercises every day.  Doing exercises for several weeks.  Making Kegel exercises a part of your regular exercise routine. This information is not intended to replace advice given to you by your health care provider. Make sure you discuss any questions you have with your health care provider. Document Revised: 12/20/2017 Document Reviewed: 12/20/2017 Elsevier Patient Education  [employer].

## 2020-04-28 ENCOUNTER — Telehealth: Payer: Self-pay | Admitting: Genetic Counselor

## 2020-04-28 LAB — CYTOLOGY - PAP: Diagnosis: NEGATIVE

## 2020-04-28 NOTE — Telephone Encounter (Signed)
Received a genetic counseling referral from Dr. Quincy Simmonds for fhx of brca. Ms. Diane Day returned my call and has been scheduled to see Raquel Sarna on 1/4 at 11am. Pt aware to arrive 30 minutes early.

## 2020-04-30 ENCOUNTER — Other Ambulatory Visit: Payer: Self-pay

## 2020-04-30 ENCOUNTER — Encounter: Payer: Self-pay | Admitting: Internal Medicine

## 2020-04-30 ENCOUNTER — Ambulatory Visit (INDEPENDENT_AMBULATORY_CARE_PROVIDER_SITE_OTHER): Payer: Medicare HMO | Admitting: Internal Medicine

## 2020-04-30 VITALS — BP 124/84 | HR 66 | Temp 98.1°F | Ht 66.75 in | Wt 126.0 lb

## 2020-04-30 DIAGNOSIS — I10 Essential (primary) hypertension: Secondary | ICD-10-CM

## 2020-04-30 DIAGNOSIS — M810 Age-related osteoporosis without current pathological fracture: Secondary | ICD-10-CM | POA: Diagnosis not present

## 2020-04-30 DIAGNOSIS — Z23 Encounter for immunization: Secondary | ICD-10-CM | POA: Diagnosis not present

## 2020-04-30 DIAGNOSIS — E7800 Pure hypercholesterolemia, unspecified: Secondary | ICD-10-CM

## 2020-04-30 DIAGNOSIS — Z0000 Encounter for general adult medical examination without abnormal findings: Secondary | ICD-10-CM | POA: Diagnosis not present

## 2020-04-30 DIAGNOSIS — Z0001 Encounter for general adult medical examination with abnormal findings: Secondary | ICD-10-CM

## 2020-04-30 LAB — CBC WITH DIFFERENTIAL/PLATELET
Basophils Absolute: 0.1 10*3/uL (ref 0.0–0.1)
Basophils Relative: 1.4 % (ref 0.0–3.0)
Eosinophils Absolute: 0.1 10*3/uL (ref 0.0–0.7)
Eosinophils Relative: 1.9 % (ref 0.0–5.0)
HCT: 42.1 % (ref 36.0–46.0)
Hemoglobin: 13.9 g/dL (ref 12.0–15.0)
Lymphocytes Relative: 47 % — ABNORMAL HIGH (ref 12.0–46.0)
Lymphs Abs: 1.8 10*3/uL (ref 0.7–4.0)
MCHC: 32.9 g/dL (ref 30.0–36.0)
MCV: 91.2 fl (ref 78.0–100.0)
Monocytes Absolute: 0.5 10*3/uL (ref 0.1–1.0)
Monocytes Relative: 12.8 % — ABNORMAL HIGH (ref 3.0–12.0)
Neutro Abs: 1.4 10*3/uL (ref 1.4–7.7)
Neutrophils Relative %: 36.9 % — ABNORMAL LOW (ref 43.0–77.0)
Platelets: 304 10*3/uL (ref 150.0–400.0)
RBC: 4.62 Mil/uL (ref 3.87–5.11)
RDW: 13.2 % (ref 11.5–15.5)
WBC: 3.8 10*3/uL — ABNORMAL LOW (ref 4.0–10.5)

## 2020-04-30 LAB — LIPID PANEL
Cholesterol: 184 mg/dL (ref 0–200)
HDL: 78.5 mg/dL (ref >39.00)
LDL Cholesterol: 95 mg/dL (ref 0–99)
NonHDL: 105.28
Total CHOL/HDL Ratio: 2
Triglycerides: 52 mg/dL (ref 0.0–149.0)
VLDL: 10.4 mg/dL (ref 0.0–40.0)

## 2020-04-30 LAB — HEPATIC FUNCTION PANEL
ALT: 18 U/L (ref 0–35)
AST: 21 U/L (ref 0–37)
Albumin: 4.3 g/dL (ref 3.5–5.2)
Alkaline Phosphatase: 73 U/L (ref 39–117)
Bilirubin, Direct: 0.1 mg/dL (ref 0.0–0.3)
Total Bilirubin: 0.5 mg/dL (ref 0.2–1.2)
Total Protein: 6.6 g/dL (ref 6.0–8.3)

## 2020-04-30 LAB — BASIC METABOLIC PANEL
BUN: 12 mg/dL (ref 6–23)
CO2: 30 mEq/L (ref 19–32)
Calcium: 9.5 mg/dL (ref 8.4–10.5)
Chloride: 108 mEq/L (ref 96–112)
Creatinine, Ser: 0.74 mg/dL (ref 0.40–1.20)
GFR: 83.95 mL/min (ref >60.00)
Glucose, Bld: 104 mg/dL — ABNORMAL HIGH (ref 70–99)
Potassium: 4 mEq/L (ref 3.5–5.1)
Sodium: 141 mEq/L (ref 135–145)

## 2020-04-30 NOTE — Progress Notes (Signed)
Subjective:  Patient ID: Diane Day, female    DOB: 04-17-1951  Age: 69 y.o. MRN: [id number]  CC: Hypertension, Annual Exam, and Hyperlipidemia  This visit occurred during the SARS-CoV-2 public health emergency.  Safety protocols were in place, including screening questions prior to the visit, additional usage of staff PPE, and extensive cleaning of exam room while observing appropriate contact time as indicated for disinfecting solutions.    HPI Diane Day presents for a CPX.  She is very active and walks several miles a day.  She does not experience CP, DOE, palpitations, edema, fatigue, dizziness, lightheadedness, or near syncope.  Outpatient Medications Prior to Visit  Medication Sig Dispense Refill   aspirin 81 MG EC tablet Take 1 tablet by mouth daily.     calcium carbonate (TUMS - DOSED IN MG ELEMENTAL CALCIUM) 500 MG chewable tablet Chew 1 tablet by mouth as needed.     Cholecalciferol (VITAMIN D3) 2000 UNITS TABS Take by mouth every morning.     indapamide (LOZOL) 1.25 MG tablet TAKE 1 TABLET ONCE DAILY. 90 tablet 1   Multiple Vitamin (MULTIVITAMIN) tablet Take 1 tablet by mouth daily.     psyllium (METAMUCIL) 58.6 % powder Take 1 packet by mouth daily.     raloxifene (EVISTA) 60 MG tablet TAKE 1 TABLET ONCE DAILY. 90 tablet 3   telmisartan (MICARDIS) 40 MG tablet TAKE 1 TABLET ONCE DAILY. 90 tablet 1   chlorpheniramine-HYDROcodone (TUSSIONEX PENNKINETIC ER) 10-8 MG/5ML SUER Take 5 mLs by mouth every 12 (twelve) hours as needed for cough. 140 mL 0   No facility-administered medications prior to visit.    ROS Review of Systems  Constitutional: Negative for chills, diaphoresis, fatigue and fever.  HENT: Negative.  Negative for sinus pressure and sore throat.   Respiratory: Negative for cough, chest tightness, shortness of breath and wheezing.   Cardiovascular: Negative for chest pain, palpitations and leg swelling.  Gastrointestinal: Negative  for abdominal pain, constipation, diarrhea, nausea and vomiting.  Endocrine: Negative.   Genitourinary: Negative.  Negative for difficulty urinating and dysuria.  Musculoskeletal: Negative.  Negative for arthralgias, back pain, myalgias and neck pain.  Skin: Negative.   Neurological: Negative.  Negative for dizziness, weakness, light-headedness and headaches.  Hematological: Negative for adenopathy. Does not bruise/bleed easily.  Psychiatric/Behavioral: Negative.     Objective:  BP 124/84    Pulse 66    Temp 98.1 F (36.7 C) (Oral)    Ht 5' 6.75" (1.695 m)    Wt 126 lb (57.2 kg)    LMP 05/16/2006    SpO2 98%    BMI 19.88 kg/m   BP Readings from Last 3 Encounters:  04/30/20 124/84  04/24/20 110/68  04/17/20 136/82    Wt Readings from Last 3 Encounters:  04/30/20 126 lb (57.2 kg)  04/24/20 126 lb (57.2 kg)  04/17/20 125 lb 6.4 oz (56.9 kg)    Physical Exam Vitals reviewed.  HENT:     Nose: Nose normal.     Mouth/Throat:     Mouth: Mucous membranes are moist.  Eyes:     General: No scleral icterus.    Conjunctiva/sclera: Conjunctivae normal.  Cardiovascular:     Rate and Rhythm: Normal rate and regular rhythm.     Heart sounds: No murmur heard.   Pulmonary:     Effort: Pulmonary effort is normal.     Breath sounds: No stridor. No wheezing, rhonchi or rales.  Abdominal:     General: Abdomen is  flat. Bowel sounds are normal. There is no distension.     Palpations: Abdomen is soft. There is no hepatomegaly, splenomegaly or mass.     Tenderness: There is no abdominal tenderness.  Musculoskeletal:        General: Normal range of motion.     Cervical back: Neck supple.     Right lower leg: No edema.     Left lower leg: No edema.  Lymphadenopathy:     Cervical: No cervical adenopathy.  Skin:    General: Skin is warm and dry.  Neurological:     General: No focal deficit present.     Mental Status: She is alert.  Psychiatric:        Mood and Affect: Mood normal.         Behavior: Behavior normal.     Lab Results  Component Value Date   WBC 3.8 (L) 04/30/2020   HGB 13.9 04/30/2020   HCT 42.1 04/30/2020   PLT 304.0 04/30/2020   GLUCOSE 104 (H) 04/30/2020   CHOL 184 04/30/2020   TRIG 52.0 04/30/2020   HDL 78.50 04/30/2020   LDLCALC 95 04/30/2020   ALT 18 04/30/2020   AST 21 04/30/2020   NA 141 04/30/2020   K 4.0 04/30/2020   CL 108 04/30/2020   CREATININE 0.74 04/30/2020   BUN 12 04/30/2020   CO2 30 04/30/2020   TSH 1.09 04/23/2019   INR 1.1 (H) 04/23/2019   HGBA1C 5.6 11/20/2014    MR BREAST BILATERAL W WO CONTRAST INC CAD  Result Date: 05/22/2019 CLINICAL DATA:  69 year old female presenting for high-risk screening MRI. Strong family history of breast cancer. LABS:  None performed on site. EXAM: BILATERAL BREAST MRI WITH AND WITHOUT CONTRAST TECHNIQUE: Multiplanar, multisequence MR images of both breasts were obtained prior to and following the intravenous administration of 5 ml of Gadavist. Three-dimensional MR images were rendered by post-processing of the original MR data on an independent workstation. The three-dimensional MR images were interpreted, and findings are reported in the following complete MRI report for this study. Three dimensional images were evaluated at the independent DynaCad workstation COMPARISON:  Previous exam(s). FINDINGS: Breast composition: b. Scattered fibroglandular tissue. Background parenchymal enhancement: Mild. Right breast: No mass or abnormal enhancement. Left breast: No mass or abnormal enhancement. Lymph nodes: No abnormal appearing lymph nodes. Ancillary findings:  None. IMPRESSION: No MRI evidence of malignancy in either breast. RECOMMENDATION: Routine annual screening with mammography and breast MRI. The patient is due for her next screening mammogram in October 2021. BI-RADS CATEGORY  1: Negative. Electronically Signed   By: Kristopher Oppenheim M.D.   On: 05/22/2019 11:10    Assessment & Plan:   Diane Day was  seen today for hypertension, annual exam and hyperlipidemia.  Diagnoses and all orders for this visit:  Essential hypertension- Her blood pressure is adequately well controlled.  Electrolytes and renal function are normal.  Will continue the current dose of the ARB. -     CBC with Differential/Platelet; Future -     Basic metabolic panel; Future -     Hepatic function panel; Future -     CBC with Differential/Platelet -     Basic metabolic panel -     Hepatic function panel  Osteoporosis without current pathological fracture, unspecified osteoporosis type- Will continue Evista  Pure hypercholesterolemia- Her ASCVD risk or is less than 10%.  Statin therapy is not indicated. -     Lipid panel; Future -  Hepatic function panel; Future -     Lipid panel -     Hepatic function panel  Other orders -     Pneumococcal polysaccharide vaccine 23-valent greater than or equal to 2yo subcutaneous/IM   I have discontinued Diane Day "Liz"'s chlorpheniramine-HYDROcodone. I am also having her maintain her calcium carbonate, Vitamin D3, multivitamin, aspirin, psyllium, telmisartan, indapamide, and raloxifene.  No orders of the defined types were placed in this encounter.    Follow-up: Return in about 6 months (around 10/29/2020).  Scarlette Calico, MD

## 2020-04-30 NOTE — Patient Instructions (Signed)

## 2020-05-01 DIAGNOSIS — Z0001 Encounter for general adult medical examination with abnormal findings: Secondary | ICD-10-CM | POA: Insufficient documentation

## 2020-05-01 NOTE — Assessment & Plan Note (Signed)
Exam completed Labs reviewed Vaccines reviewed and updated Cancer screenings are up-to-date Patient education was given 

## 2020-05-19 ENCOUNTER — Encounter: Payer: Medicare HMO | Admitting: Genetic Counselor

## 2020-05-19 ENCOUNTER — Other Ambulatory Visit: Payer: Medicare HMO

## 2020-05-19 ENCOUNTER — Telehealth: Payer: Self-pay | Admitting: Genetic Counselor

## 2020-05-19 NOTE — Telephone Encounter (Signed)
Per patient request, rescheduled Diane Day genetic counseling appointment to Tuesday, 3/15 at 11am.

## 2020-05-20 ENCOUNTER — Telehealth: Payer: Self-pay

## 2020-05-20 NOTE — Telephone Encounter (Signed)
Breast MRI next week.  Dr. Edward Jolly had told her she would send Rx in for anxiety with the MRI.  Pharmacy confirmed.

## 2020-05-21 ENCOUNTER — Other Ambulatory Visit: Payer: Self-pay | Admitting: Obstetrics and Gynecology

## 2020-05-21 MED ORDER — LORAZEPAM 1 MG PO TABS: ORAL_TABLET | ORAL | 0 refills | Status: DC

## 2020-05-21 NOTE — Telephone Encounter (Signed)
I sent a prescription for Ativan 1 mg tablets to her [facility].  She is to take Ativan 1 mg po x 1, one hour prior to procedure.  Disp:  2 RF:  none

## 2020-05-22 ENCOUNTER — Encounter: Payer: Self-pay | Admitting: Obstetrics and Gynecology

## 2020-05-22 NOTE — Telephone Encounter (Signed)
Spoke with patient and reviewed Rx directions. Informed her it has been sent to [facility].

## 2020-05-25 ENCOUNTER — Other Ambulatory Visit: Payer: Self-pay

## 2020-05-25 ENCOUNTER — Ambulatory Visit
Admission: RE | Admit: 2020-05-25 | Discharge: 2020-05-25 | Disposition: A | Discharge status: Home / Self Care | Payer: Medicare HMO | Source: Ambulatory Visit | Attending: Obstetrics and Gynecology | Admitting: Obstetrics and Gynecology

## 2020-05-25 DIAGNOSIS — Z9189 Other specified personal risk factors, not elsewhere classified: Secondary | ICD-10-CM

## 2020-05-25 DIAGNOSIS — N6489 Other specified disorders of breast: Secondary | ICD-10-CM | POA: Diagnosis not present

## 2020-05-25 MED ORDER — GADOBUTROL 1 MMOL/ML IV SOLN
5.0000 mL | Freq: Once | INTRAVENOUS | Status: AC | PRN
  Administered 2020-05-25: 5 mL via INTRAVENOUS

## 2020-05-26 ENCOUNTER — Encounter: Payer: Self-pay | Admitting: Obstetrics and Gynecology

## 2020-05-26 DIAGNOSIS — M8589 Other specified disorders of bone density and structure, multiple sites: Secondary | ICD-10-CM | POA: Diagnosis not present

## 2020-05-26 DIAGNOSIS — Z780 Asymptomatic menopausal state: Secondary | ICD-10-CM | POA: Diagnosis not present

## 2020-06-08 ENCOUNTER — Ambulatory Visit: Payer: Medicare HMO | Admitting: Obstetrics and Gynecology

## 2020-06-22 DIAGNOSIS — H5213 Myopia, bilateral: Secondary | ICD-10-CM | POA: Diagnosis not present

## 2020-06-22 DIAGNOSIS — H2513 Age-related nuclear cataract, bilateral: Secondary | ICD-10-CM | POA: Diagnosis not present

## 2020-06-30 ENCOUNTER — Other Ambulatory Visit: Payer: Medicare HMO

## 2020-06-30 ENCOUNTER — Encounter: Payer: Medicare HMO | Admitting: Genetic Counselor

## 2020-07-01 ENCOUNTER — Telehealth: Payer: Self-pay | Admitting: Obstetrics and Gynecology

## 2020-07-01 NOTE — Telephone Encounter (Signed)
Spoke with patient and informed her. I, also, emailed her the note in My Chart.

## 2020-07-01 NOTE — Telephone Encounter (Signed)
Please contact patient in follow up to her BMD from [place] in January.  My apologies for the delay in results due to our move to our new office.   Her BMD does show osteoporosis of the left hip.  She has osteopenia of the right hip and spine.   Her bone density of the hips did increase significantly and her spine bone density is stable since her last study done in 2020.  I recommend she continue with the Evista.   Her next bone density is due in 2 years.

## 2020-07-01 NOTE — Telephone Encounter (Signed)
Left message to call me.

## 2020-07-06 ENCOUNTER — Telehealth (INDEPENDENT_AMBULATORY_CARE_PROVIDER_SITE_OTHER): Payer: Medicare HMO | Admitting: Family Medicine

## 2020-07-06 ENCOUNTER — Encounter: Payer: Self-pay | Admitting: Family Medicine

## 2020-07-06 VITALS — Ht 66.75 in

## 2020-07-06 DIAGNOSIS — J329 Chronic sinusitis, unspecified: Secondary | ICD-10-CM

## 2020-07-06 MED ORDER — AZELASTINE HCL 0.1 % NA SOLN: 2.0000 | Freq: Two times a day (BID) | NASAL | 0 refills | Status: DC

## 2020-07-06 MED ORDER — AMOXICILLIN-POT CLAVULANATE 875-125 MG PO TABS: 1.0000 | ORAL_TABLET | Freq: Two times a day (BID) | ORAL | 0 refills | Status: DC

## 2020-07-06 MED ORDER — GUAIFENESIN-CODEINE 100-10 MG/5ML PO SOLN: 5.0000 mL | Freq: Three times a day (TID) | ORAL | 0 refills | Status: DC | PRN

## 2020-07-06 NOTE — Progress Notes (Signed)
   Diane Day is a 70 y.o. female who presents today for a virtual office visit.  Assessment/Plan:  Sinusitis  No red flags.  Will start Astelin.  We will also start guaifenesin-codeine cough syrup.  Encourage good oral hydration.  We will send in pocket prescription for Augmentin with instruction to not start unless symptoms worsen or  Do not improve the next few days.  Encourage good oral hydration.  Discussed reasons to return to care.  Follow-up as needed.    Subjective:  HPI:  Symptoms started 4 days ago.  Were improving till this morning when she started having fever.  She took a home Covid test which was negative.  Her granddaughter has been sick with croup.  She tried taking Afrin and cough syrup which has helped.  She has had a lot of sinus congestion.  No shortness of breath.  No substernal chest pain.      Objective/Observations  Physical Exam: Gen: NAD, resting comfortably Pulm: Normal work of breathing Neuro: Grossly normal, moves all extremities Psych: Normal affect and thought content  Virtual Visit via Video   I connected with Diane Day on 07/06/20 at 10:40 AM EST by a video enabled telemedicine application and verified that I am speaking with the correct person using two identifiers. The limitations of evaluation and management by telemedicine and the availability of in person appointments were discussed. The patient expressed understanding and agreed to proceed.   Patient location: Home Provider location: [place] participating in the virtual visit: Myself and Patient     Algis Greenhouse. Jerline Pain, MD 07/06/2020 11:16 AM

## 2020-07-18 ENCOUNTER — Other Ambulatory Visit: Payer: Self-pay | Admitting: Internal Medicine

## 2020-07-21 ENCOUNTER — Telehealth: Payer: Self-pay | Admitting: Genetic Counselor

## 2020-07-21 NOTE — Telephone Encounter (Signed)
Rescheduled Ms. Appleton genetic counseling appointment from 3/15 to 3/21 at 10:00am with Santiago Glad.

## 2020-07-28 ENCOUNTER — Other Ambulatory Visit: Payer: Medicare HMO

## 2020-07-28 ENCOUNTER — Encounter: Payer: Medicare HMO | Admitting: Genetic Counselor

## 2020-08-03 ENCOUNTER — Other Ambulatory Visit: Payer: Medicare HMO

## 2020-08-03 ENCOUNTER — Other Ambulatory Visit: Payer: Self-pay

## 2020-08-03 ENCOUNTER — Inpatient Hospital Stay: Payer: Medicare HMO | Attending: Genetic Counselor | Admitting: Licensed Clinical Social Worker

## 2020-08-03 ENCOUNTER — Encounter: Payer: Self-pay | Admitting: Licensed Clinical Social Worker

## 2020-08-03 DIAGNOSIS — Z803 Family history of malignant neoplasm of breast: Secondary | ICD-10-CM | POA: Diagnosis not present

## 2020-08-03 NOTE — Progress Notes (Addendum)
REFERRING PROVIDER: Nunzio Cobbs, MD [address] [place],  [place] [address]  PRIMARY PROVIDER:  Janith Lima, MD  PRIMARY REASON FOR VISIT:  1. Family history of breast cancer     HISTORY OF PRESENT ILLNESS:   Diane Day, a 70 y.o. female, was seen for a [place] cancer genetics consultation at the request of Dr. Quincy Simmonds due to a family history of breast cancer.  Diane Day presents to clinic today to discuss the possibility of a hereditary predisposition to cancer, genetic testing, and to further clarify her future cancer risks, as well as potential cancer risks for family members.    CANCER HISTORY:  Oncology History   No history exists.     RISK FACTORS:  Menarche was at age 66.  First live birth at age 52.  OCP use for approximately 0 years.  Ovaries intact: yes.  Hysterectomy: no.   Menopausal status: postmenopausal.  HRT use: 0 years. Colonoscopy: yes; normal. Mammogram within the last year: yes. She also has MRIs due to [employer] risk being 30%.  Number of breast biopsies: 1. Up to date with pelvic exams: yes. Any excessive radiation exposure in the past: no  Past Medical History:  Diagnosis Date  . Cancer ([place])    basal cell, face  . Family history of breast cancer   . Hypertension   . Osteopenia   . Osteoporosis   . Rectal bleeding     Past Surgical History:  Procedure Laterality Date  . BREAST SURGERY Left 1998   remove fibroadenoma  . KNEE ARTHROSCOPY Left   . repair broken arm  2008  . TUBAL LIGATION  1994    Social History   Socioeconomic History  . Marital status: Married    Spouse name: Not on file  . Number of children: 3  . Years of education: Not on file  . Highest education level: Not on file  Occupational History    Employer: [employer]  Tobacco Use  . Smoking status: Former Smoker    Packs/day: 0.50    Years: 5.00    Pack years: 2.50    Types: Cigarettes    Quit date: 12/02/1974     Years since quitting: 45.7  . Smokeless tobacco: Never Used  Vaping Use  . Vaping Use: Never used  Substance and Sexual Activity  . Alcohol use: Yes    Alcohol/week: 14.0 standard drinks    Types: 14 Cans of beer per week    Comment: occ glass of wine, drink of choice beer  . Drug use: No  . Sexual activity: Yes    Partners: Male    Birth control/protection: Post-menopausal  Other Topics Concern  . Not on file  Social History Narrative   Daily caffeine use   Social Determinants of Health   Financial Resource Strain: Not on file  Food Insecurity: Not on file  Transportation Needs: Not on file  Physical Activity: Not on file  Stress: Not on file  Social Connections: Not on file     FAMILY HISTORY:  We obtained a detailed, 4-generation family history.  Significant diagnoses are listed below: Family History  Problem Relation Age of Onset  . Diabetes Father   . Heart disease Father   . Hypertension Father   . Esophageal cancer Father 82  . Breast cancer Mother 4  . Hypertension Mother   . Osteoporosis Mother   . Breast cancer Sister 40  BRCA negative  . Diabetes Sister   . Breast cancer Sister 66  . Cancer Paternal Aunt        unknown form of cancer over the age of 67  . Thyroid cancer Cousin        maternal first cousin  . Breast cancer Cousin        died in her 72s; paternal cousin  . Breast cancer Cousin        paternal first cousin  . Breast cancer Sister   . Osteoporosis Brother   . Colon cancer Neg Hx   . Stomach cancer Neg Hx   . Rectal cancer Neg Hx    Diane Day has 2 daughters and 1 son. She has 4 sisters. One sister had breast and was diagnosed at 60 and died at 77. Another sister had breast cancer at 16 and was reportedly BRCA1/2 negative. Another sister was more recently diagnosed with breast cancer in her 17s, possibly had genetic testing.  Diane Day mother was diagnosed with breast cancer at 53 and died at 30. A maternal cousin had  thyroid cancer in her 60s. No other known cancers on this side of the family.  Diane Day father had esophageal cancer at 22, he passed at 76. Patient knows of at least two paternal first cousins who had breast cancer. No other known cancers on this side of the family.  Diane Day is aware of previous family history of genetic testing for hereditary cancer risks. Patient's maternal ancestors are of [place] descent, and paternal ancestors are of Scotch/Irish/Italian descent. There is no reported Ashkenazi Jewish ancestry. There is no known consanguinity.    GENETIC COUNSELING ASSESSMENT: Diane Day is a 70 y.o. female with a family history of breast cancer which is somewhat suggestive of a hereditary cancer syndrome and predisposition to cancer. We, therefore, discussed and recommended the following at today's visit.   DISCUSSION: We discussed that approximately 5-10% of breast cancer is hereditary. Most cases of hereditary breast cancer are associated with BRCA1/BRCA2 genes, although there are other genes associated with hereditary breast cancer as well including PALB2, CHEK2, ATM .  We discussed that testing is beneficial for several reasons including knowing about other cancer risks, identifying potential screening and risk-reduction options that may be appropriate, and to understand if other family members could be at risk for cancer and allow them to undergo genetic testing.   We reviewed the characteristics, features and inheritance patterns of hereditary cancer syndromes. We also discussed genetic testing, including the appropriate family members to test, the process of testing, insurance coverage and turn-around-time for results. We discussed the implications of a negative, positive and/or variant of uncertain significant result. We recommended Diane Day pursue genetic testing for the [facility] Multi-Cancer gene panel.   The Multi-Cancer Panel + RNA offered by [facility] includes  sequencing and/or deletion duplication testing of the following 84 genes: AIP, ALK, APC, ATM, AXIN2,BAP1,  BARD1, BLM, BMPR1A, BRCA1, BRCA2, BRIP1, CASR, CDC73, CDH1, CDK4, CDKN1B, CDKN1C, CDKN2A (p14ARF), CDKN2A (p16INK4a), CEBPA, CHEK2, CTNNA1, DICER1, DIS3L2, EGFR (c.2369C>T, p.Thr790Met variant only), EPCAM (Deletion/duplication testing only), FH, FLCN, GATA2, GPC3, GREM1 (Promoter region deletion/duplication testing only), HOXB13 (c.251G>A, p.Gly84Glu), HRAS, KIT, MAX, MEN1, MET, MITF (c.952G>A, p.Glu318Lys variant only), MLH1, MSH2, MSH3, MSH6, MUTYH, NBN, NF1, NF2, NTHL1, PALB2, PDGFRA, PHOX2B, PMS2, POLD1, POLE, POT1, PRKAR1A, PTCH1, PTEN, RAD50, RAD51C, RAD51D, RB1, RECQL4, RET, RUNX1, SDHAF2, SDHA (sequence changes only), SDHB, SDHC, SDHD, SMAD4, SMARCA4, SMARCB1, SMARCE1, STK11, SUFU, TERC, TERT, TMEM127, TP53,  TSC1, TSC2, VHL, WRN and WT1.  Based on Diane Day family history of cancer, she meets medical criteria for genetic testing. Despite that she meets criteria, she may still have an out of pocket cost. We discussed that if her out of pocket cost for testing is over $100, the laboratory will call and confirm whether she wants to proceed with testing.  If the out of pocket cost of testing is less than $100 she will be billed by the genetic testing laboratory.   PLAN: After considering the risks, benefits, and limitations, Diane Day not wish to pursue genetic testing at today's visit. She would like more time to think it over and to find out more about her sisters' genetic testing.  We understand this decision and remain available to coordinate genetic testing at any time in the future. We, therefore, recommend Diane Day continue to follow the cancer screening guidelines given by her primary healthcare provider.  Based on Diane Day family history, we recommended her sisters have genetic counseling and testing if they have not. Diane Day will let us know if we can be of any  assistance in coordinating genetic counseling and/or testing for this family member.   Diane Day questions were answered to her satisfaction today. Our contact information was provided should additional questions or concerns arise. Thank you for the referral and allowing [place] to share in the care of your patient.   Faith Rogue, MS, [facility] Genetic Counselor [email or web address] Phone: [phone]  The patient was seen for a total of 40 minutes in face-to-face genetic counseling. [employer] Intern Magda Paganini was present and assisted with the session. Drs. Magrinat/Gudena/Feng were available for discussion regarding this case.   _______________________________________________________________________ For Office Staff:  Number of people involved in session: 2 Was an Intern/ student involved with case: yes

## 2020-08-08 ENCOUNTER — Other Ambulatory Visit: Payer: Self-pay | Admitting: Internal Medicine

## 2020-08-08 DIAGNOSIS — I10 Essential (primary) hypertension: Secondary | ICD-10-CM

## 2020-09-08 DIAGNOSIS — D1801 Hemangioma of skin and subcutaneous tissue: Secondary | ICD-10-CM | POA: Diagnosis not present

## 2020-09-08 DIAGNOSIS — L918 Other hypertrophic disorders of the skin: Secondary | ICD-10-CM | POA: Diagnosis not present

## 2020-09-08 DIAGNOSIS — L814 Other melanin hyperpigmentation: Secondary | ICD-10-CM | POA: Diagnosis not present

## 2020-09-08 DIAGNOSIS — Z85828 Personal history of other malignant neoplasm of skin: Secondary | ICD-10-CM | POA: Diagnosis not present

## 2020-09-08 DIAGNOSIS — L821 Other seborrheic keratosis: Secondary | ICD-10-CM | POA: Diagnosis not present

## 2020-09-08 DIAGNOSIS — L819 Disorder of pigmentation, unspecified: Secondary | ICD-10-CM | POA: Diagnosis not present

## 2020-09-08 DIAGNOSIS — D225 Melanocytic nevi of trunk: Secondary | ICD-10-CM | POA: Diagnosis not present

## 2020-09-08 DIAGNOSIS — L570 Actinic keratosis: Secondary | ICD-10-CM | POA: Diagnosis not present

## 2020-09-08 DIAGNOSIS — B353 Tinea pedis: Secondary | ICD-10-CM | POA: Diagnosis not present

## 2020-10-15 DIAGNOSIS — Z20828 Contact with and (suspected) exposure to other viral communicable diseases: Secondary | ICD-10-CM | POA: Diagnosis not present

## 2020-10-26 DIAGNOSIS — C44612 Basal cell carcinoma of skin of right upper limb, including shoulder: Secondary | ICD-10-CM | POA: Diagnosis not present

## 2020-11-02 DIAGNOSIS — Z20828 Contact with and (suspected) exposure to other viral communicable diseases: Secondary | ICD-10-CM | POA: Diagnosis not present

## 2020-11-03 ENCOUNTER — Telehealth (INDEPENDENT_AMBULATORY_CARE_PROVIDER_SITE_OTHER): Payer: Medicare HMO | Admitting: Family Medicine

## 2020-11-03 ENCOUNTER — Encounter: Payer: Self-pay | Admitting: Family Medicine

## 2020-11-03 ENCOUNTER — Other Ambulatory Visit: Payer: Self-pay

## 2020-11-03 VITALS — BP 122/84 | HR 96

## 2020-11-03 DIAGNOSIS — J0140 Acute pansinusitis, unspecified: Secondary | ICD-10-CM

## 2020-11-03 MED ORDER — AMOXICILLIN-POT CLAVULANATE 875-125 MG PO TABS: 1.0000 | ORAL_TABLET | Freq: Two times a day (BID) | ORAL | 0 refills | Status: DC

## 2020-11-03 NOTE — Progress Notes (Addendum)
MyChart Video Visit    Virtual Visit via Video Note   This visit type was conducted due to national recommendations for restrictions regarding the COVID-19 Pandemic (e.g. social distancing) in an effort to limit this patient's exposure and mitigate transmission in our community. This patient is at least at moderate risk for complications without adequate follow up. This format is felt to be most appropriate for this patient at this time. Physical exam was limited by quality of the video and audio technology used for the visit. Kem Boroughs was able to get the patient set up on a video visit.  Patient location: Home Patient and provider in visit Provider location: Office  I discussed the limitations of evaluation and management by telemedicine and the availability of in person appointments. The patient expressed understanding and agreed to proceed.  Visit Date: 11/03/2020  Today's healthcare provider: Ann Held, DO     Subjective:    Patient ID: Diane Day, female    DOB: March 22, 1951, 70 y.o.   MRN: [id number]  Chief Complaint  Patient presents with   Cough    Complains of productive cough that started Saturday   Headache   Sore Throat    Complains of sore throat, tested for covid twice negative.     HPI Patient is in today for a video visit.  + cough and sinus pressure since sat.  + low grade fever at 99--- several home covid tests and pcr all neg.   + sore throat  Pt has taking codeine and guaf cough meds for night time with little relief   Past Medical History:  Diagnosis Date   Cancer ([place])    basal cell, face   Family history of breast cancer    Hypertension    Osteopenia    Osteoporosis    Rectal bleeding     Past Surgical History:  Procedure Laterality Date   BREAST SURGERY Left 1998   remove fibroadenoma   KNEE ARTHROSCOPY Left    repair broken arm  2008   TUBAL LIGATION  1994    Family History  Problem Relation Age of  Onset   Diabetes Father    Heart disease Father    Hypertension Father    Esophageal cancer Father 31   Breast cancer Mother 48   Hypertension Mother    Osteoporosis Mother    Breast cancer Sister 53       BRCA negative   Diabetes Sister    Breast cancer Sister 45   Cancer Paternal 48        unknown form of cancer over the age of 85   Thyroid cancer Cousin        maternal first cousin   Breast cancer Cousin        died in her 17s; paternal cousin   Breast cancer Cousin        paternal first cousin   Breast cancer Sister    Osteoporosis Brother    Colon cancer Neg Hx    Stomach cancer Neg Hx    Rectal cancer Neg Hx     Social History   Socioeconomic History   Marital status: Married    Spouse name: Not on file   Number of children: 3   Years of education: Not on file   Highest education level: Not on file  Occupational History    Employer: [employer]  Tobacco Use   Smoking status: Former    Packs/day: 0.50  Years: 5.00    Pack years: 2.50    Types: Cigarettes    Quit date: 12/02/1974    Years since quitting: 45.9   Smokeless tobacco: Never  Vaping Use   Vaping Use: Never used  Substance and Sexual Activity   Alcohol use: Yes    Alcohol/week: 14.0 standard drinks    Types: 14 Cans of beer per week    Comment: occ glass of wine, drink of choice beer   Drug use: No   Sexual activity: Yes    Partners: Male    Birth control/protection: Post-menopausal  Other Topics Concern   Not on file  Social History Narrative   Daily caffeine use   Social Determinants of Health   Financial Resource Strain: Not on file  Food Insecurity: Not on file  Transportation Needs: Not on file  Physical Activity: Not on file  Stress: Not on file  Social Connections: Not on file  Intimate Partner Violence: Not on file    Outpatient Medications Prior to Visit  Medication Sig Dispense Refill   aspirin 81 MG EC tablet Take 1 tablet by mouth daily.     calcium  carbonate (TUMS - DOSED IN MG ELEMENTAL CALCIUM) 500 MG chewable tablet Chew 1 tablet by mouth as needed.     Cholecalciferol (VITAMIN D3) 2000 UNITS TABS Take by mouth every morning.     indapamide (LOZOL) 1.25 MG tablet TAKE 1 TABLET ONCE DAILY. 90 tablet 1   Multiple Vitamin (MULTIVITAMIN) tablet Take 1 tablet by mouth daily.     psyllium (METAMUCIL) 58.6 % powder Take 1 packet by mouth daily.     raloxifene (EVISTA) 60 MG tablet TAKE 1 TABLET ONCE DAILY. 90 tablet 3   telmisartan (MICARDIS) 40 MG tablet TAKE 1 TABLET ONCE DAILY. 90 tablet 1   amoxicillin-clavulanate (AUGMENTIN) 875-125 MG tablet Take 1 tablet by mouth 2 (two) times daily. 20 tablet 0   guaiFENesin-codeine 100-10 MG/5ML syrup Take 5 mLs by mouth 3 (three) times daily as needed for cough. 120 mL 0   azelastine (ASTELIN) 0.1 % nasal spray Place 2 sprays into both nostrils 2 (two) times daily. 30 mL 0   No facility-administered medications prior to visit.    No Known Allergies  Review of Systems  Constitutional:  Positive for fever. Negative for chills and malaise/fatigue.  HENT:  Positive for congestion, sinus pain and sore throat.   Eyes:  Negative for blurred vision.  Respiratory:  Positive for cough. Negative for shortness of breath.   Cardiovascular:  Negative for chest pain, palpitations and leg swelling.  Gastrointestinal:  Negative for abdominal pain, blood in stool and nausea.  Genitourinary:  Negative for dysuria and frequency.  Musculoskeletal:  Negative for falls.  Skin:  Negative for rash.  Neurological:  Negative for dizziness, loss of consciousness and headaches.  Endo/Heme/Allergies:  Negative for environmental allergies.  Psychiatric/Behavioral:  Negative for depression. The patient is not nervous/anxious.       Objective:    Physical Exam Vitals and nursing note reviewed.  Constitutional:      General: She is not in acute distress.    Appearance: She is well-developed. She is not ill-appearing,  toxic-appearing or diaphoretic.  Pulmonary:     Effort: Pulmonary effort is normal.  Neurological:     Mental Status: She is alert.  Psychiatric:        Mood and Affect: Mood normal.        Speech: Speech normal.  Behavior: Behavior normal.   BP 122/84   Pulse 96   LMP 05/16/2006  Wt Readings from Last 3 Encounters:  04/30/20 126 lb (57.2 kg)  04/24/20 126 lb (57.2 kg)  04/17/20 125 lb 6.4 oz (56.9 kg)    Diabetic Foot Exam - Simple   No data filed    Lab Results  Component Value Date   WBC 3.8 (L) 04/30/2020   HGB 13.9 04/30/2020   HCT 42.1 04/30/2020   PLT 304.0 04/30/2020   GLUCOSE 104 (H) 04/30/2020   CHOL 184 04/30/2020   TRIG 52.0 04/30/2020   HDL 78.50 04/30/2020   LDLCALC 95 04/30/2020   ALT 18 04/30/2020   AST 21 04/30/2020   NA 141 04/30/2020   K 4.0 04/30/2020   CL 108 04/30/2020   CREATININE 0.74 04/30/2020   BUN 12 04/30/2020   CO2 30 04/30/2020   TSH 1.09 04/23/2019   INR 1.1 (H) 04/23/2019   HGBA1C 5.6 11/20/2014    Lab Results  Component Value Date   TSH 1.09 04/23/2019   Lab Results  Component Value Date   WBC 3.8 (L) 04/30/2020   HGB 13.9 04/30/2020   HCT 42.1 04/30/2020   MCV 91.2 04/30/2020   PLT 304.0 04/30/2020   Lab Results  Component Value Date   NA 141 04/30/2020   K 4.0 04/30/2020   CO2 30 04/30/2020   GLUCOSE 104 (H) 04/30/2020   BUN 12 04/30/2020   CREATININE 0.74 04/30/2020   BILITOT 0.5 04/30/2020   ALKPHOS 73 04/30/2020   AST 21 04/30/2020   ALT 18 04/30/2020   PROT 6.6 04/30/2020   ALBUMIN 4.3 04/30/2020   CALCIUM 9.5 04/30/2020   GFR 83.95 04/30/2020   Lab Results  Component Value Date   CHOL 184 04/30/2020   Lab Results  Component Value Date   HDL 78.50 04/30/2020   Lab Results  Component Value Date   LDLCALC 95 04/30/2020   Lab Results  Component Value Date   TRIG 52.0 04/30/2020   Lab Results  Component Value Date   CHOLHDL 2 04/30/2020   Lab Results  Component Value Date    HGBA1C 5.6 11/20/2014       Assessment & Plan:   Problem List Items Addressed This Visit   None Visit Diagnoses     Acute non-recurrent pansinusitis    -  Primary   Relevant Medications   amoxicillin-clavulanate (AUGMENTIN) 875-125 MG tablet     Pt has cough med  She did not want the nasal spray Rto prn    Meds ordered this encounter  Medications   amoxicillin-clavulanate (AUGMENTIN) 875-125 MG tablet    Sig: Take 1 tablet by mouth 2 (two) times daily.    Dispense:  20 tablet    Refill:  0    I discussed the assessment and treatment plan with the patient. The patient was provided an opportunity to ask questions and all were answered. The patient agreed with the plan and demonstrated an understanding of the instructions.   The patient was advised to call back or seek an in-person evaluation if the symptoms worsen or if the condition fails to improve as anticipated.  I provided 20 minutes of face-to-face time during this encounter.   Ann Held, DO [place] at [employer] [phone] (phone) [phone] (fax)  [place]

## 2020-11-05 ENCOUNTER — Encounter: Payer: Self-pay | Admitting: Family Medicine

## 2020-11-05 MED ORDER — CEFDINIR 300 MG PO CAPS: 300.0000 mg | ORAL_CAPSULE | Freq: Two times a day (BID) | ORAL | 0 refills | Status: DC

## 2021-01-25 ENCOUNTER — Other Ambulatory Visit: Payer: Self-pay | Admitting: Internal Medicine

## 2021-02-20 ENCOUNTER — Other Ambulatory Visit: Payer: Self-pay | Admitting: Internal Medicine

## 2021-02-20 DIAGNOSIS — I10 Essential (primary) hypertension: Secondary | ICD-10-CM

## 2021-02-22 ENCOUNTER — Encounter: Payer: Self-pay | Admitting: Internal Medicine

## 2021-02-23 ENCOUNTER — Telehealth (INDEPENDENT_AMBULATORY_CARE_PROVIDER_SITE_OTHER): Payer: Medicare HMO | Admitting: Internal Medicine

## 2021-02-23 DIAGNOSIS — J329 Chronic sinusitis, unspecified: Secondary | ICD-10-CM

## 2021-02-23 MED ORDER — CEFDINIR 300 MG PO CAPS: 300.0000 mg | ORAL_CAPSULE | Freq: Two times a day (BID) | ORAL | 0 refills | Status: DC

## 2021-02-23 NOTE — Progress Notes (Signed)
Patient ID: Diane Day, female   DOB: 12-Apr-1951, 70 y.o.   MRN: [id number]  Virtual Visit via Video Note  I connected with Diane Day on 02/23/21 at  1:40 PM EDT by a video enabled telemedicine application and verified that I am speaking with the correct person using two identifiers.  Location of all participants today Patient: at home Provider: at office    I discussed the limitations of evaluation and management by telemedicine and the availability of in person appointments. The patient expressed understanding and agreed to proceed.  History of Present Illness:  Here with 2-3 days acute onset fever, facial pain, pressure, headache, general weakness and malaise, and greenish d/c, with mild ST and cough, but pt denies chest pain, wheezing, increased sob or doe, orthopnea, PND, increased LE swelling, palpitations, dizziness or syncope.  Covid neg yesterday at home.     Past Medical History:  Diagnosis Date   Cancer ([place])    basal cell, face   Family history of breast cancer    Hypertension    Osteopenia    Osteoporosis    Rectal bleeding    Past Surgical History:  Procedure Laterality Date   BREAST SURGERY Left 1998   remove fibroadenoma   KNEE ARTHROSCOPY Left    repair broken arm  2008   TUBAL LIGATION  1994    reports that she quit smoking about 46 years ago. Her smoking use included cigarettes. She has a 2.50 pack-year smoking history. She has never used smokeless tobacco. She reports current alcohol use of about 14.0 standard drinks per week. She reports that she does not use drugs. family history includes Breast cancer in her cousin, cousin, and sister; Breast cancer (age of onset: 48) in her sister; Breast cancer (age of onset: 65) in her sister; Breast cancer (age of onset: 47) in her mother; Cancer in her paternal aunt; Diabetes in her father and sister; Esophageal cancer (age of onset: 68) in her father; Heart disease in her father; Hypertension in her  father and mother; Osteoporosis in her brother and mother; Thyroid cancer in her cousin. No Known Allergies Current Outpatient Medications on File Prior to Visit  Medication Sig Dispense Refill   aspirin 81 MG EC tablet Take 1 tablet by mouth daily.     calcium carbonate (TUMS - DOSED IN MG ELEMENTAL CALCIUM) 500 MG chewable tablet Chew 1 tablet by mouth as needed.     Cholecalciferol (VITAMIN D3) 2000 UNITS TABS Take by mouth every morning.     indapamide (LOZOL) 1.25 MG tablet TAKE ONE TABLET DAILY 90 tablet 0   Multiple Vitamin (MULTIVITAMIN) tablet Take 1 tablet by mouth daily.     psyllium (METAMUCIL) 58.6 % powder Take 1 packet by mouth daily.     raloxifene (EVISTA) 60 MG tablet TAKE 1 TABLET ONCE DAILY. 90 tablet 3   telmisartan (MICARDIS) 40 MG tablet TAKE 1 TABLET ONCE DAILY. 90 tablet 1   No current facility-administered medications on file prior to visit.   Observations/Objective: Alert, NAD, appropriate mood and affect, resps normal, cn 2-12 intact, moves all 4s, no visible rash or swelling Lab Results  Component Value Date   WBC 3.8 (L) 04/30/2020   HGB 13.9 04/30/2020   HCT 42.1 04/30/2020   PLT 304.0 04/30/2020   GLUCOSE 104 (H) 04/30/2020   CHOL 184 04/30/2020   TRIG 52.0 04/30/2020   HDL 78.50 04/30/2020   LDLCALC 95 04/30/2020   ALT 18 04/30/2020   AST  21 04/30/2020   NA 141 04/30/2020   K 4.0 04/30/2020   CL 108 04/30/2020   CREATININE 0.74 04/30/2020   BUN 12 04/30/2020   CO2 30 04/30/2020   TSH 1.09 04/23/2019   INR 1.1 (H) 04/23/2019   HGBA1C 5.6 11/20/2014   Assessment and Plan: See notes  Follow Up Instructions: See notes   I discussed the assessment and treatment plan with the patient. The patient was provided an opportunity to ask questions and all were answered. The patient agreed with the plan and demonstrated an understanding of the instructions.   The patient was advised to call back or seek an in-person evaluation if the symptoms worsen  or if the condition fails to improve as anticipated.  Cathlean Cower, MD

## 2021-02-26 ENCOUNTER — Encounter: Payer: Self-pay | Admitting: Internal Medicine

## 2021-02-26 DIAGNOSIS — J329 Chronic sinusitis, unspecified: Secondary | ICD-10-CM | POA: Insufficient documentation

## 2021-02-26 NOTE — Assessment & Plan Note (Signed)
Mild to mod, for antibx course,  to f/u any worsening symptoms or concerns 

## 2021-02-26 NOTE — Patient Instructions (Signed)
Please take all new medication as prescribed 

## 2021-03-15 DIAGNOSIS — Z1231 Encounter for screening mammogram for malignant neoplasm of breast: Secondary | ICD-10-CM | POA: Diagnosis not present

## 2021-03-16 ENCOUNTER — Encounter: Payer: Self-pay | Admitting: Obstetrics and Gynecology

## 2021-04-26 ENCOUNTER — Encounter: Payer: Self-pay | Admitting: Internal Medicine

## 2021-04-26 ENCOUNTER — Other Ambulatory Visit: Payer: Self-pay

## 2021-04-26 ENCOUNTER — Ambulatory Visit (INDEPENDENT_AMBULATORY_CARE_PROVIDER_SITE_OTHER): Payer: Medicare HMO | Admitting: Internal Medicine

## 2021-04-26 VITALS — BP 126/78 | HR 76 | Temp 98.0°F | Resp 16 | Ht 66.75 in | Wt 127.0 lb

## 2021-04-26 DIAGNOSIS — Z23 Encounter for immunization: Secondary | ICD-10-CM

## 2021-04-26 DIAGNOSIS — E7800 Pure hypercholesterolemia, unspecified: Secondary | ICD-10-CM | POA: Diagnosis not present

## 2021-04-26 DIAGNOSIS — M810 Age-related osteoporosis without current pathological fracture: Secondary | ICD-10-CM

## 2021-04-26 DIAGNOSIS — I10 Essential (primary) hypertension: Secondary | ICD-10-CM

## 2021-04-26 DIAGNOSIS — Z0001 Encounter for general adult medical examination with abnormal findings: Secondary | ICD-10-CM | POA: Diagnosis not present

## 2021-04-26 LAB — BASIC METABOLIC PANEL
BUN: 11 mg/dL (ref 6–23)
CO2: 29 mEq/L (ref 19–32)
Calcium: 9.7 mg/dL (ref 8.4–10.5)
Chloride: 105 mEq/L (ref 96–112)
Creatinine, Ser: 0.7 mg/dL (ref 0.40–1.20)
GFR: 89.11 mL/min (ref >60.00)
Glucose, Bld: 80 mg/dL (ref 70–99)
Potassium: 3.9 mEq/L (ref 3.5–5.1)
Sodium: 143 mEq/L (ref 135–145)

## 2021-04-26 LAB — CBC WITH DIFFERENTIAL/PLATELET
Basophils Absolute: 0.1 10*3/uL (ref 0.0–0.1)
Basophils Relative: 0.9 % (ref 0.0–3.0)
Eosinophils Absolute: 0.1 10*3/uL (ref 0.0–0.7)
Eosinophils Relative: 0.9 % (ref 0.0–5.0)
HCT: 38.3 % (ref 36.0–46.0)
Hemoglobin: 12.7 g/dL (ref 12.0–15.0)
Lymphocytes Relative: 44.9 % (ref 12.0–46.0)
Lymphs Abs: 2.8 10*3/uL (ref 0.7–4.0)
MCHC: 33.1 g/dL (ref 30.0–36.0)
MCV: 90.8 fl (ref 78.0–100.0)
Monocytes Absolute: 0.7 10*3/uL (ref 0.1–1.0)
Monocytes Relative: 10.9 % (ref 3.0–12.0)
Neutro Abs: 2.6 10*3/uL (ref 1.4–7.7)
Neutrophils Relative %: 42.4 % — ABNORMAL LOW (ref 43.0–77.0)
Platelets: 271 10*3/uL (ref 150.0–400.0)
RBC: 4.22 Mil/uL (ref 3.87–5.11)
RDW: 14.1 % (ref 11.5–15.5)
WBC: 6.1 10*3/uL (ref 4.0–10.5)

## 2021-04-26 LAB — LIPID PANEL
Cholesterol: 185 mg/dL (ref 0–200)
HDL: 85 mg/dL (ref >39.00)
LDL Cholesterol: 91 mg/dL (ref 0–99)
NonHDL: 99.89
Total CHOL/HDL Ratio: 2
Triglycerides: 43 mg/dL (ref 0.0–149.0)
VLDL: 8.6 mg/dL (ref 0.0–40.0)

## 2021-04-26 LAB — HEPATIC FUNCTION PANEL
ALT: 12 U/L (ref 0–35)
AST: 17 U/L (ref 0–37)
Albumin: 4.4 g/dL (ref 3.5–5.2)
Alkaline Phosphatase: 54 U/L (ref 39–117)
Bilirubin, Direct: 0.1 mg/dL (ref 0.0–0.3)
Total Bilirubin: 0.6 mg/dL (ref 0.2–1.2)
Total Protein: 6.6 g/dL (ref 6.0–8.3)

## 2021-04-26 LAB — VITAMIN D 25 HYDROXY (VIT D DEFICIENCY, FRACTURES): VITD: 66.26 ng/mL (ref 30.00–100.00)

## 2021-04-26 LAB — TSH: TSH: 0.81 u[IU]/mL (ref 0.35–5.50)

## 2021-04-26 MED ORDER — TELMISARTAN 40 MG PO TABS: 40.0000 mg | ORAL_TABLET | Freq: Every day | ORAL | 1 refills | Status: DC

## 2021-04-26 NOTE — Patient Instructions (Signed)
Preventive Care 40 Years and Older, Female Preventive care refers to lifestyle choices and visits with your health care provider that can promote health and wellness. Preventive care visits are also called wellness exams. What can I expect for my preventive care visit? Counseling Your health care provider may ask you questions about your: Medical history, including: Past medical problems. Family medical history. Pregnancy and menstrual history. History of falls. Current health, including: Memory and ability to understand (cognition). Emotional well-being. Home life and relationship well-being. Sexual activity and sexual health. Lifestyle, including: Alcohol, nicotine or tobacco, and drug use. Access to firearms. Diet, exercise, and sleep habits. Work and work Statistician. Sunscreen use. Safety issues such as seatbelt and bike helmet use. Physical exam Your health care provider will check your: Height and weight. These may be used to calculate your BMI (body mass index). BMI is a measurement that tells if you are at a healthy weight. Waist circumference. This measures the distance around your waistline. This measurement also tells if you are at a healthy weight and may help predict your risk of certain diseases, such as type 2 diabetes and high blood pressure. Heart rate and blood pressure. Body temperature. Skin for abnormal spots. What immunizations do I need? Vaccines are usually given at various ages, according to a schedule. Your health care provider will recommend vaccines for you based on your age, medical history, and lifestyle or other factors, such as travel or where you work. What tests do I need? Screening Your health care provider may recommend screening tests for certain conditions. This may include: Lipid and cholesterol levels. Hepatitis C test. Hepatitis B test. HIV (human immunodeficiency virus) test. STI (sexually transmitted infection) testing, if you are at  risk. Lung cancer screening. Colorectal cancer screening. Diabetes screening. This is done by checking your blood sugar (glucose) after you have not eaten for a while (fasting). Mammogram. Talk with your health care provider about how often you should have regular mammograms. BRCA-related cancer screening. This may be done if you have a family history of breast, ovarian, tubal, or peritoneal cancers. Bone density scan. This is done to screen for osteoporosis. Talk with your health care provider about your test results, treatment options, and if necessary, the need for more tests. Follow these instructions at home: Eating and drinking  Eat a diet that includes fresh fruits and vegetables, whole grains, lean protein, and low-fat dairy products. Limit your intake of foods with high amounts of sugar, saturated fats, and salt. Take vitamin and mineral supplements as recommended by your health care provider. Do not drink alcohol if your health care provider tells you not to drink. If you drink alcohol: Limit how much you have to 0-1 drink a day. Know how much alcohol is in your drink. In the [place] one drink equals one 12 oz bottle of beer (355 mL), one 5 oz glass of wine (148 mL), or one 1 oz glass of hard liquor (44 mL). Lifestyle Brush your teeth every morning and night with fluoride toothpaste. Floss one time each day. Exercise for at least 30 minutes 5 or more days each week. Do not use any products that contain nicotine or tobacco. These products include cigarettes, chewing tobacco, and vaping devices, such as e-cigarettes. If you need help quitting, ask your health care provider. Do not use drugs. If you are sexually active, practice safe sex. Use a condom or other form of protection in order to prevent STIs. Take aspirin only as told by your  health care provider. Make sure that you understand how much to take and what form to take. Work with your health care provider to find out whether it  is safe and beneficial for you to take aspirin daily. Ask your health care provider if you need to take a cholesterol-lowering medicine (statin). Find healthy ways to manage stress, such as: Meditation, yoga, or listening to music. Journaling. Talking to a trusted person. Spending time with friends and family. Minimize exposure to UV radiation to reduce your risk of skin cancer. Safety Always wear your seat belt while driving or riding in a vehicle. Do not drive: If you have been drinking alcohol. Do not ride with someone who has been drinking. When you are tired or distracted. While texting. If you have been using any mind-altering substances or drugs. Wear a helmet and other protective equipment during sports activities. If you have firearms in your house, make sure you follow all gun safety procedures. What's next? Visit your health care provider once a year for an annual wellness visit. Ask your health care provider how often you should have your eyes and teeth checked. Stay up to date on all vaccines. This information is not intended to replace advice given to you by your health care provider. Make sure you discuss any questions you have with your health care provider. Document Revised: 10/28/2020 Document Reviewed: 10/28/2020 Elsevier Patient Education  [place].

## 2021-04-26 NOTE — Progress Notes (Signed)
Subjective:  Patient ID: Diane Day, female    DOB: April 11, 1951  Age: 70 y.o. MRN: [id number]  CC: Annual Exam and Hypertension  This visit occurred during the SARS-CoV-2 public health emergency.  Safety protocols were in place, including screening questions prior to the visit, additional usage of staff PPE, and extensive cleaning of exam room while observing appropriate contact time as indicated for disinfecting solutions.    HPI Diane Day presents for a CPX and f/up -   She has recently had a few episodes of dizziness/lightheadedness and her BP has been down to 128/78. She is active and denies CP, DOE, palpitations, edema, or fatigue.  Outpatient Medications Prior to Visit  Medication Sig Dispense Refill   aspirin 81 MG EC tablet Take 1 tablet by mouth daily.     calcium carbonate (TUMS - DOSED IN MG ELEMENTAL CALCIUM) 500 MG chewable tablet Chew 1 tablet by mouth as needed.     Cholecalciferol (VITAMIN D3) 2000 UNITS TABS Take by mouth every morning.     Multiple Vitamin (MULTIVITAMIN) tablet Take 1 tablet by mouth daily.     psyllium (METAMUCIL) 58.6 % powder Take 1 packet by mouth daily.     raloxifene (EVISTA) 60 MG tablet TAKE 1 TABLET ONCE DAILY. 90 tablet 3   cefdinir (OMNICEF) 300 MG capsule Take 1 capsule (300 mg total) by mouth 2 (two) times daily. 20 capsule 0   indapamide (LOZOL) 1.25 MG tablet TAKE ONE TABLET DAILY 90 tablet 0   telmisartan (MICARDIS) 40 MG tablet TAKE 1 TABLET ONCE DAILY. 90 tablet 1   No facility-administered medications prior to visit.    ROS Review of Systems  Constitutional:  Negative for diaphoresis, fatigue and unexpected weight change.  HENT: Negative.    Eyes: Negative.   Respiratory:  Negative for cough, chest tightness, shortness of breath and wheezing.   Cardiovascular:  Negative for chest pain, palpitations and leg swelling.  Gastrointestinal:  Negative for abdominal pain, diarrhea, nausea and vomiting.  Endocrine:  Negative.  Negative for polyuria.  Genitourinary: Negative.  Negative for difficulty urinating and dysuria.  Musculoskeletal: Negative.  Negative for arthralgias.  Skin: Negative.   Neurological:  Positive for dizziness and light-headedness. Negative for speech difficulty and weakness.  Hematological:  Negative for adenopathy. Does not bruise/bleed easily.   Objective:  BP 126/78 (BP Location: Right Arm, Patient Position: Sitting, Cuff Size: Normal)   Pulse 76   Temp 98 F (36.7 C) (Oral)   Resp 16   Ht 5' 6.75" (1.695 m)   Wt 127 lb (57.6 kg)   LMP 05/16/2006   SpO2 98%   BMI 20.04 kg/m   BP Readings from Last 3 Encounters:  04/26/21 126/78  11/03/20 122/84  04/30/20 124/84    Wt Readings from Last 3 Encounters:  04/26/21 127 lb (57.6 kg)  04/30/20 126 lb (57.2 kg)  04/24/20 126 lb (57.2 kg)    Physical Exam Vitals reviewed.  Constitutional:      Appearance: Normal appearance.  HENT:     Nose: Nose normal.     Mouth/Throat:     Mouth: Mucous membranes are moist.  Eyes:     General: No scleral icterus.    Conjunctiva/sclera: Conjunctivae normal.  Cardiovascular:     Rate and Rhythm: Normal rate and regular rhythm.     Heart sounds: No murmur heard. Pulmonary:     Effort: Pulmonary effort is normal.     Breath sounds: No stridor. No wheezing,  rhonchi or rales.  Abdominal:     General: Abdomen is flat.     Palpations: There is no mass.     Tenderness: There is no abdominal tenderness. There is no right CVA tenderness or guarding.     Hernia: No hernia is present.  Musculoskeletal:        General: Normal range of motion.     Cervical back: Neck supple.     Right lower leg: No edema.     Left lower leg: No edema.  Lymphadenopathy:     Cervical: No cervical adenopathy.  Skin:    General: Skin is warm and dry.     Coloration: Skin is not pale.  Neurological:     General: No focal deficit present.     Mental Status: She is alert.  Psychiatric:        Mood  and Affect: Mood normal.        Behavior: Behavior normal.    Lab Results  Component Value Date   WBC 6.1 04/26/2021   HGB 12.7 04/26/2021   HCT 38.3 04/26/2021   PLT 271.0 04/26/2021   GLUCOSE 80 04/26/2021   CHOL 185 04/26/2021   TRIG 43.0 04/26/2021   HDL 85.00 04/26/2021   LDLCALC 91 04/26/2021   ALT 12 04/26/2021   AST 17 04/26/2021   NA 143 04/26/2021   K 3.9 04/26/2021   CL 105 04/26/2021   CREATININE 0.70 04/26/2021   BUN 11 04/26/2021   CO2 29 04/26/2021   TSH 0.81 04/26/2021   INR 1.1 (H) 04/23/2019   HGBA1C 5.6 11/20/2014    MR BREAST BILATERAL W WO CONTRAST INC CAD  Result Date: 05/26/2020 CLINICAL DATA:  Strong family history of breast cancer. High risk screening breast MRI. LABS:  Not applicable EXAM: BILATERAL BREAST MRI WITH AND WITHOUT CONTRAST TECHNIQUE: Multiplanar, multisequence MR images of both breasts were obtained prior to and following the intravenous administration of 5 ml of Gadavist Three-dimensional MR images were rendered by post-processing of the original MR data on an independent workstation. The three-dimensional MR images were interpreted, and findings are reported in the following complete MRI report for this study. Three dimensional images were evaluated at the independent interpreting workstation using the DynaCAD thin client. COMPARISON:  Breast MRI dated 05/22/2019. FINDINGS: Breast composition: b. Scattered fibroglandular tissue. Background parenchymal enhancement: Mild Right breast: No mass or abnormal enhancement. Left breast: No mass or abnormal enhancement. Lymph nodes: No abnormal appearing lymph nodes. Ancillary findings:  None. IMPRESSION: No MRI evidence of malignancy within either breast. RECOMMENDATION: Continued annual screening mammograms and annual screening breast MRI. BI-RADS CATEGORY  1: Negative. Electronically Signed   By: Franki Cabot M.D.   On: 05/26/2020 13:07    Assessment & Plan:   Diane Day was seen today for annual  exam and hypertension.  Diagnoses and all orders for this visit:  Essential hypertension- Her BP is over-controlled and she is sx. Will discontinue the thiazide diuretic. Her labs are normal. -     CBC with Differential/Platelet; Future -     TSH; Future -     Hepatic function panel; Future -     Basic metabolic panel; Future -     telmisartan (MICARDIS) 40 MG tablet; Take 1 tablet (40 mg total) by mouth daily. -     Basic metabolic panel -     Hepatic function panel -     TSH -     CBC with Differential/Platelet  Osteoporosis without current  pathological fracture, unspecified osteoporosis type- Vit D and Ca++ are normal. -     Hepatic function panel; Future -     VITAMIN D 25 Hydroxy (Vit-D Deficiency, Fractures); Future -     Basic metabolic panel; Future -     Basic metabolic panel -     VITAMIN D 25 Hydroxy (Vit-D Deficiency, Fractures) -     Hepatic function panel  Pure hypercholesterolemia- Statin ts is not indicated. -     Lipid panel; Future -     TSH; Future -     Hepatic function panel; Future -     Hepatic function panel -     TSH -     Lipid panel  Encounter for general adult medical examination with abnormal findings - Exam completed, labs reviewed, vaccines updated, cancer screenings are UTD, pt ed material was given.  Need for shingles vaccine -     Zoster Vaccine Adjuvanted [facility]) injection; Inject 0.5 mLs into the muscle once for 1 dose.  I have discontinued Diane Day "Liz"'s indapamide and cefdinir. I have also changed her telmisartan. Additionally, I am having her start on Shingrix. Lastly, I am having her maintain her calcium carbonate, Vitamin D3, multivitamin, aspirin, psyllium, and raloxifene.  Meds ordered this encounter  Medications   telmisartan (MICARDIS) 40 MG tablet    Sig: Take 1 tablet (40 mg total) by mouth daily.    Dispense:  90 tablet    Refill:  1   Zoster Vaccine Adjuvanted [facility]) injection    Sig: Inject 0.5 mLs  into the muscle once for 1 dose.    Dispense:  0.5 mL    Refill:  1     Follow-up: Return in about 6 months (around 10/25/2021).  Scarlette Calico, MD

## 2021-04-27 MED ORDER — SHINGRIX 50 MCG/0.5ML IM SUSR: 0.5000 mL | Freq: Once | INTRAMUSCULAR | 1 refills | Status: AC

## 2021-05-05 ENCOUNTER — Encounter: Payer: Medicare HMO | Admitting: Internal Medicine

## 2021-05-06 NOTE — Progress Notes (Signed)
70 y.o. G66P3003 Married Caucasian female here for breast and pelvic exam.    Losing height, lost 1/2 inch.    Recent trip to the ER for dehydration.   PCP:   Scarlette Calico, MD  [name] was 05/16/2006.           Sexually active: Yes.    The current method of family planning is Tubal/post menopausal status.    Exercising: Yes.     Swim, walking and strength training Smoker:  no  Health Maintenance: Pap:  04-24-20 Neg, 02/24/16 Neg:Neg HR HPV,   11/30/12 ASCUS:Neg HR HPV History of abnormal Pap:  No MMG:  03-15-21 Neg/birads1 Colonoscopy:  08/29/12 Mild diverticulosis. Repeat 10 years BMD:  05-26-20  Result : Osteoporosis left hip, osteopenia right hip and spine.  TDaP:  2014 Gardasil:   no HIV:03-22-16 Neg Hep C: 03-22-16 Neg Screening Labs:  PCP.   reports that she quit smoking about 46 years ago. Her smoking use included cigarettes. She has a 2.50 pack-year smoking history. She has never used smokeless tobacco. She reports current alcohol use of about 5.0 standard drinks per week. She reports that she does not use drugs.  Past Medical History:  Diagnosis Date   Cancer ([place])    basal cell, face   Family history of breast cancer    Hypertension    Osteopenia    Osteoporosis    Rectal bleeding     Past Surgical History:  Procedure Laterality Date   BREAST SURGERY Left 1998   remove fibroadenoma   KNEE ARTHROSCOPY Left    repair broken arm  2008   TUBAL LIGATION  1994    Current Outpatient Medications  Medication Sig Dispense Refill   calcium carbonate (TUMS - DOSED IN MG ELEMENTAL CALCIUM) 500 MG chewable tablet Chew 1 tablet by mouth as needed.     Cholecalciferol (VITAMIN D3) 2000 UNITS TABS Take by mouth every morning.     Multiple Vitamin (MULTIVITAMIN) tablet Take 1 tablet by mouth daily.     ondansetron (ZOFRAN-ODT) 8 MG disintegrating tablet Take 1 tablet (8 mg total) by mouth every 8 (eight) hours as needed. 8 tablet 0   psyllium  (METAMUCIL) 58.6 % powder Take 1 packet by mouth daily.     raloxifene (EVISTA) 60 MG tablet TAKE 1 TABLET ONCE DAILY. 90 tablet 3   telmisartan (MICARDIS) 40 MG tablet Take 1 tablet (40 mg total) by mouth daily. 90 tablet 1   No current facility-administered medications for this visit.    Family History  Problem Relation Age of Onset   Diabetes Father    Heart disease Father    Hypertension Father    Esophageal cancer Father 24   Breast cancer Mother 41   Hypertension Mother    Osteoporosis Mother    Breast cancer Sister 26       BRCA negative   Diabetes Sister    Breast cancer Sister 71   Cancer Paternal 60        unknown form of cancer over the age of 68   Thyroid cancer Cousin        maternal first cousin   Breast cancer Cousin        died in her 46s; paternal cousin   Breast cancer Cousin        paternal first cousin   Breast cancer Sister    Osteoporosis Brother    Colon cancer Neg Hx    Stomach cancer Neg Hx  Rectal cancer Neg Hx     Review of Systems  All other systems reviewed and are negative.  Exam:   BP 110/76    Pulse 81    Ht 5' 6" (1.676 m)    Wt 124 lb (56.2 kg)    LMP 05/16/2006    SpO2 100%    BMI 20.01 kg/m     General appearance: alert, cooperative and appears stated age Head: normocephalic, without obvious abnormality, atraumatic Neck: no adenopathy, supple, symmetrical, trachea midline and thyroid normal to inspection and palpation Lungs: clear to auscultation bilaterally Breasts: normal appearance, no masses or tenderness, No nipple retraction or dimpling, No nipple discharge or bleeding, No axillary adenopathy Heart: regular rate and rhythm Abdomen: soft, non-tender; no masses, no organomegaly Extremities: extremities normal, atraumatic, no cyanosis or edema Skin: skin color, texture, turgor normal. No rashes or lesions Lymph nodes: cervical, supraclavicular, and axillary nodes normal. Neurologic: grossly normal  Pelvic: External  genitalia:  no lesions              No abnormal inguinal nodes palpated.              Urethra:  normal appearing urethra with no masses, tenderness or lesions              Bartholins and Skenes: normal                 Vagina: normal appearing vagina with normal color and discharge, no lesions              Cervix: no lesions              Pap taken: no Bimanual Exam:  Uterus:  normal size, contour, position, consistency, mobility, non-tender              Adnexa: no mass, fullness, tenderness              Rectal exam: yes.  Confirms.              Anus:  normal sphincter tone, no lesions  Chaperone was present for exam:  Estill Bamberg, CMA  Assessment:   Well woman visit with gynecologic exam. Strong FH breast cancer.  Increased risk of breast cancer.  31 - 37%.    Has metal rod in her right arm.  Osteoporosis.  Stable. On Evista since end of 2018.   Plan: Mammogram screening discussed. Yearly breast MRI recommended.  Self breast awareness reviewed. Pap and HR HPV as above. Guidelines for Calcium, Vitamin D, regular exercise program including cardiovascular and weight bearing exercise. Refill of Evista 60 mg, #90, RF 3.   Risks and benefits reviewed.  BMD in one year at [facility].  Follow up annually and prn.   After visit summary provided.    32 min total time was spent for this patient encounter, including preparation, face-to-face counseling with the patient, coordination of care, and documentation of the encounter.

## 2021-05-14 ENCOUNTER — Emergency Department (HOSPITAL_COMMUNITY)
Admission: EM | Admit: 2021-05-14 | Discharge: 2021-05-15 | Disposition: A | Discharge status: Home / Self Care | Payer: Medicare HMO | Attending: Emergency Medicine | Admitting: Emergency Medicine

## 2021-05-14 ENCOUNTER — Other Ambulatory Visit: Payer: Self-pay

## 2021-05-14 DIAGNOSIS — E860 Dehydration: Secondary | ICD-10-CM | POA: Diagnosis not present

## 2021-05-14 DIAGNOSIS — R197 Diarrhea, unspecified: Secondary | ICD-10-CM | POA: Insufficient documentation

## 2021-05-14 DIAGNOSIS — Z85828 Personal history of other malignant neoplasm of skin: Secondary | ICD-10-CM | POA: Diagnosis not present

## 2021-05-14 DIAGNOSIS — Z20822 Contact with and (suspected) exposure to covid-19: Secondary | ICD-10-CM | POA: Diagnosis not present

## 2021-05-14 DIAGNOSIS — I10 Essential (primary) hypertension: Secondary | ICD-10-CM | POA: Insufficient documentation

## 2021-05-14 DIAGNOSIS — R1111 Vomiting without nausea: Secondary | ICD-10-CM | POA: Diagnosis not present

## 2021-05-14 DIAGNOSIS — Z79899 Other long term (current) drug therapy: Secondary | ICD-10-CM | POA: Diagnosis not present

## 2021-05-14 DIAGNOSIS — N289 Disorder of kidney and ureter, unspecified: Secondary | ICD-10-CM | POA: Diagnosis not present

## 2021-05-14 DIAGNOSIS — Z7982 Long term (current) use of aspirin: Secondary | ICD-10-CM | POA: Insufficient documentation

## 2021-05-14 DIAGNOSIS — D72829 Elevated white blood cell count, unspecified: Secondary | ICD-10-CM | POA: Insufficient documentation

## 2021-05-14 DIAGNOSIS — R112 Nausea with vomiting, unspecified: Secondary | ICD-10-CM | POA: Insufficient documentation

## 2021-05-14 DIAGNOSIS — R110 Nausea: Secondary | ICD-10-CM | POA: Diagnosis not present

## 2021-05-14 DIAGNOSIS — Z87891 Personal history of nicotine dependence: Secondary | ICD-10-CM | POA: Insufficient documentation

## 2021-05-14 DIAGNOSIS — Z743 Need for continuous supervision: Secondary | ICD-10-CM | POA: Diagnosis not present

## 2021-05-14 LAB — CBC WITH DIFFERENTIAL/PLATELET
Abs Immature Granulocytes: 0.06 10*3/uL (ref 0.00–0.07)
Basophils Absolute: 0 10*3/uL (ref 0.0–0.1)
Basophils Relative: 0 %
Eosinophils Absolute: 0 10*3/uL (ref 0.0–0.5)
Eosinophils Relative: 0 %
HCT: 48.1 % — ABNORMAL HIGH (ref 36.0–46.0)
Hemoglobin: 16.1 g/dL — ABNORMAL HIGH (ref 12.0–15.0)
Immature Granulocytes: 0 %
Lymphocytes Relative: 3 %
Lymphs Abs: 0.4 10*3/uL — ABNORMAL LOW (ref 0.7–4.0)
MCH: 30.8 pg (ref 26.0–34.0)
MCHC: 33.5 g/dL (ref 30.0–36.0)
MCV: 92 fL (ref 80.0–100.0)
Monocytes Absolute: 0.5 10*3/uL (ref 0.1–1.0)
Monocytes Relative: 4 %
Neutro Abs: 13.2 10*3/uL — ABNORMAL HIGH (ref 1.7–7.7)
Neutrophils Relative %: 93 %
Platelets: 263 10*3/uL (ref 150–400)
RBC: 5.23 MIL/uL — ABNORMAL HIGH (ref 3.87–5.11)
RDW: 13.9 % (ref 11.5–15.5)
WBC: 14.1 10*3/uL — ABNORMAL HIGH (ref 4.0–10.5)
nRBC: 0 % (ref 0.0–0.2)

## 2021-05-14 NOTE — ED Provider Notes (Signed)
Emergency Medicine Provider Triage Evaluation Note  Diane Day , a 70 y.o. female  was evaluated in triage.  Pt complains of nausea, vomiting, diarrhea that started this morning.  Patient states her symptoms have been persistent.  She has been unable to tolerate any p.o. intake.  Reports cramping in her legs.  No hematemesis or hematochezia.  Denies abdominal pain or URI symptoms.  Physical Exam  BP 97/75 (BP Location: Right Arm)    Pulse 99    Temp 98 F (36.7 C) (Oral)    Resp 18    Ht 5\' 7"  (1.702 m)    Wt 57.6 kg    LMP 05/16/2006    SpO2 98%    BMI 19.89 kg/m  Gen:   Awake, no distress   Resp:  Normal effort  MSK:   Moves extremities without difficulty  Other:    Medical Decision Making  Medically screening exam initiated at 10:54 PM.  Appropriate orders placed.  [name] was informed that the remainder of the evaluation will be completed by another provider, this initial triage assessment does not replace that evaluation, and the importance of remaining in the ED until their evaluation is complete.   Rayna Sexton, PA-C 05/14/21 2255    Daleen Bo, MD 05/15/21 [phone]

## 2021-05-14 NOTE — ED Triage Notes (Signed)
Patient bib [facility], patients husband called 47 saying patient has been nauseated and vomiting all day. Patient is reporting leg cramps, nausea/vomiting. Patient denies pain in triage. Endorses nausea.

## 2021-05-15 LAB — COMPREHENSIVE METABOLIC PANEL
ALT: 27 U/L (ref 0–44)
AST: 27 U/L (ref 15–41)
Albumin: 5.2 g/dL — ABNORMAL HIGH (ref 3.5–5.0)
Alkaline Phosphatase: 70 U/L (ref 38–126)
Anion gap: 12 (ref 5–15)
BUN: 18 mg/dL (ref 8–23)
CO2: 24 mmol/L (ref 22–32)
Calcium: 10.2 mg/dL (ref 8.9–10.3)
Chloride: 102 mmol/L (ref 98–111)
Creatinine, Ser: 1.45 mg/dL — ABNORMAL HIGH (ref 0.44–1.00)
GFR, Estimated: 39 mL/min — ABNORMAL LOW (ref >60)
Glucose, Bld: 168 mg/dL — ABNORMAL HIGH (ref 70–99)
Potassium: 4.1 mmol/L (ref 3.5–5.1)
Sodium: 138 mmol/L (ref 135–145)
Total Bilirubin: 1.3 mg/dL — ABNORMAL HIGH (ref 0.3–1.2)
Total Protein: 8.4 g/dL — ABNORMAL HIGH (ref 6.5–8.1)

## 2021-05-15 LAB — MAGNESIUM: Magnesium: 1.8 mg/dL (ref 1.7–2.4)

## 2021-05-15 LAB — RESP PANEL BY RT-PCR (FLU A&B, COVID) ARPGX2
Influenza A by PCR: NEGATIVE
Influenza B by PCR: NEGATIVE
SARS Coronavirus 2 by RT PCR: NEGATIVE

## 2021-05-15 LAB — LIPASE, BLOOD: Lipase: 28 U/L (ref 11–51)

## 2021-05-15 MED ORDER — ONDANSETRON 8 MG PO TBDP: 8.0000 mg | ORAL_TABLET | Freq: Three times a day (TID) | ORAL | 0 refills | Status: DC | PRN

## 2021-05-15 MED ORDER — LACTATED RINGERS IV BOLUS
1000.0000 mL | Freq: Once | INTRAVENOUS | Status: AC
  Administered 2021-05-15: 1000 mL via INTRAVENOUS

## 2021-05-15 MED ORDER — ONDANSETRON HCL 4 MG/2ML IJ SOLN
4.0000 mg | Freq: Once | INTRAMUSCULAR | Status: AC
  Administered 2021-05-15: 4 mg via INTRAVENOUS
  Filled 2021-05-15: qty 2

## 2021-05-15 NOTE — ED Provider Notes (Signed)
[facility] DEPT Provider Note   CSN: [id number] Arrival date & time: 05/14/21  2239     History Chief Complaint - vomiting and diarrhea   Diane Day is a 70 y.o. female.  The history is provided by the patient.  Emesis Severity:  Moderate Timing:  Intermittent Progression:  Worsening Chronicity:  New Relieved by:  Nothing Worsened by:  Nothing Associated symptoms: diarrhea and myalgias   Associated symptoms: no abdominal pain, no chills, no cough, no fever and no headaches   Risk factors: sick contacts   Patient history of hypertension presents with vomiting diarrhea.  Patient reports has had multiple episodes of nonbloody emesis and diarrhea over the past 24 hours.  No significant abdominal pain.  No fever.  No cough She reports multiple sick contacts throughout the holiday    Past Medical History:  Diagnosis Date   Cancer ([place])    basal cell, face   Family history of breast cancer    Hypertension    Osteopenia    Osteoporosis    Rectal bleeding     Patient Active Problem List   Diagnosis Date Noted   Encounter for general adult medical examination with abnormal findings 05/01/2020   Achilles tendonitis, bilateral 04/23/2019   Osteoporosis without current pathological fracture 03/23/2016   Family history of breast cancer 01/01/2014   Snoring 09/19/2013   Pure hypercholesterolemia 12/02/2011   Essential hypertension 09/19/2008    Past Surgical History:  Procedure Laterality Date   BREAST SURGERY Left 1998   remove fibroadenoma   KNEE ARTHROSCOPY Left    repair broken arm  2008   TUBAL LIGATION  1994     OB History     Gravida  3   Para  3   Term  3   Preterm      AB      Living  3      SAB      IAB      Ectopic      Multiple      Live Births              Family History  Problem Relation Age of Onset   Diabetes Father    Heart disease Father    Hypertension Father    Esophageal  cancer Father 54   Breast cancer Mother 66   Hypertension Mother    Osteoporosis Mother    Breast cancer Sister 73       BRCA negative   Diabetes Sister    Breast cancer Sister 1   Cancer Paternal 77        unknown form of cancer over the age of 36   Thyroid cancer Cousin        maternal first cousin   Breast cancer Cousin        died in her 63s; paternal cousin   Breast cancer Cousin        paternal first cousin   Breast cancer Sister    Osteoporosis Brother    Colon cancer Neg Hx    Stomach cancer Neg Hx    Rectal cancer Neg Hx     Social History   Tobacco Use   Smoking status: Former    Packs/day: 0.50    Years: 5.00    Pack years: 2.50    Types: Cigarettes    Quit date: 12/02/1974    Years since quitting: 46.4   Smokeless tobacco: Never  Vaping Use  Vaping Use: Never used  Substance Use Topics   Alcohol use: Yes    Alcohol/week: 14.0 standard drinks    Types: 14 Cans of beer per week    Comment: occ glass of wine, drink of choice beer   Drug use: No    Home Medications Prior to Admission medications   Medication Sig Start Date End Date Taking? Authorizing Provider  aspirin 81 MG EC tablet Take 1 tablet by mouth daily. 04/13/17   [provider]  calcium carbonate (TUMS - DOSED IN MG ELEMENTAL CALCIUM) 500 MG chewable tablet Chew 1 tablet by mouth as needed.    [provider]  Cholecalciferol (VITAMIN D3) 2000 UNITS TABS Take by mouth every morning.    [provider]  Multiple Vitamin (MULTIVITAMIN) tablet Take 1 tablet by mouth daily.    [provider]  psyllium (METAMUCIL) 58.6 % powder Take 1 packet by mouth daily.    [provider]  raloxifene (EVISTA) 60 MG tablet TAKE 1 TABLET ONCE DAILY. 04/24/20   Nunzio Cobbs, MD  telmisartan (MICARDIS) 40 MG tablet Take 1 tablet (40 mg total) by mouth daily. 04/26/21   Janith Lima, MD    Allergies    Patient has no known allergies.  Review of  Systems   Review of Systems  Constitutional:  Negative for chills and fever.  Respiratory:  Negative for cough.   Gastrointestinal:  Positive for diarrhea and vomiting. Negative for abdominal pain and blood in stool.  Musculoskeletal:  Positive for myalgias.  Neurological:  Negative for headaches.  All other systems reviewed and are negative.  Physical Exam Updated Vital Signs BP 97/75 (BP Location: Right Arm)    Pulse 99    Temp 98 F (36.7 C) (Oral)    Resp 18    Ht 1.702 m ($Remove'5\' 7"'rvvQBeH$ )    Wt 57.6 kg    LMP 05/16/2006    SpO2 98%    BMI 19.89 kg/m   Physical Exam CONSTITUTIONAL elderly, no acute distress HEAD: Normocephalic/atraumatic EYES: EOMI/PERRL, no scleral icterus ENMT: Mask in place NECK: supple no meningeal signs SPINE/BACK:entire spine nontender CV: S1/S2 noted, no murmurs/rubs/gallops noted LUNGS: Lungs are clear to auscultation bilaterally, no apparent distress ABDOMEN: soft, nontender, no rebound or guarding, bowel sounds noted throughout abdomen GU:no cva tenderness NEURO: Pt is awake/alert/appropriate, moves all extremitiesx4.  No facial droop.   EXTREMITIES: pulses normal/equal, full ROM SKIN: warm, color normal PSYCH: no abnormalities of mood noted, alert and oriented to situation  ED Results / Procedures / Treatments   Labs (all labs ordered are listed, but only abnormal results are displayed) Labs Reviewed  COMPREHENSIVE METABOLIC PANEL - Abnormal; Notable for the following components:      Result Value   Glucose, Bld 168 (*)    Creatinine, Ser 1.45 (*)    Total Protein 8.4 (*)    Albumin 5.2 (*)    Total Bilirubin 1.3 (*)    GFR, Estimated 39 (*)    All other components within normal limits  CBC WITH DIFFERENTIAL/PLATELET - Abnormal; Notable for the following components:   WBC 14.1 (*)    RBC 5.23 (*)    Hemoglobin 16.1 (*)    HCT 48.1 (*)    Neutro Abs 13.2 (*)    Lymphs Abs 0.4 (*)    All other components within normal limits  RESP PANEL BY  RT-PCR (FLU A&B, COVID) ARPGX2  LIPASE, BLOOD  MAGNESIUM    EKG EKG  Interpretation  Date/Time:  Saturday May 15 2021 01:06:13 EST Ventricular Rate:  75 PR Interval:  168 QRS Duration: 88 QT Interval:  406 QTC Calculation: 291 R Axis:   82 Text Interpretation: Sinus rhythm Right atrial enlargement Borderline right axis deviation Probable anteroseptal infarct, old Nonspecific T abnormalities, lateral leads Confirmed by Ripley Fraise [phone]) on 05/15/2021 1:10:01 AM  Radiology No results found.  Procedures Procedures   Medications Ordered in ED Medications  lactated ringers bolus 1,000 mL (1,000 mLs Intravenous New Bag/Given 05/15/21 0057)  ondansetron (ZOFRAN) injection 4 mg (4 mg Intravenous Given 05/15/21 0056)  lactated ringers bolus 1,000 mL (1,000 mLs Intravenous New Bag/Given 05/15/21 0209)    ED Course  I have reviewed the triage vital signs and the nursing notes.  Pertinent labs & imaging results that were available during my care of the patient were reviewed by me and considered in my medical decision making (see chart for details).  Clinical Course as of 05/15/21 0315  Sat May 15, 2021  0059 Leukocytosis noted.  Renal insufficiency/dehydration noted [DW]  0311 Patient feeling improved, taking fluids feels comfortable for discharge [DW]    Clinical Course User Index [DW] Ripley Fraise, MD   MDM Rules/Calculators/A&P                         This patient presents to the ED for concern of vomiting and diarrhea, this involves an extensive number of treatment options, and is a complaint that carries with it a high risk of complications and morbidity.  The differential diagnosis includes gastroenteritis, C. difficile colitis, appendicitis, intra-abdominal pathology      Additional history obtained:  Additional history obtained from spouse who is at bedside  Lab Tests:  I Ordered, and personally interpreted labs.  The pertinent results include:  Renal insufficiency   Imaging Studies ordered:  No indication for imaging at this time  Cardiac Monitoring:  The patient was maintained on a cardiac monitor.  I personally viewed and interpreted the cardiac monitored which showed an underlying rhythm of: Sinus rhythm   Medicines ordered and prescription drug management:  I ordered medication including Zofran for nausea Reevaluation of the patient after these medicines showed that the patient improved I have reviewed the patients home medicines and have made adjustments as needed    Critical Interventions:  IV fluids    Reevaluation:  After the interventions noted above, I reevaluated the patient and found that they have :improved     Dispostion:  After consideration of the diagnostic results and the patients response to treatment, I feel that the patent would benefit from patient feels improved and appropriate for discharge home.  This likely represents a viral gastroenteritis that will slowly improve.  Patient is nontoxic and appropriate for discharge.      Final Clinical Impression(s) / ED Diagnoses Final diagnoses:  Nausea vomiting and diarrhea  Dehydration    Rx / DC Orders ED Discharge Orders          Ordered    ondansetron (ZOFRAN-ODT) 8 MG disintegrating tablet  Every 8 hours PRN        05/15/21 0312             Ripley Fraise, MD 05/15/21 [phone]

## 2021-05-18 ENCOUNTER — Encounter: Payer: Self-pay | Admitting: Obstetrics and Gynecology

## 2021-05-18 ENCOUNTER — Other Ambulatory Visit: Payer: Self-pay

## 2021-05-18 ENCOUNTER — Telehealth: Payer: Self-pay | Admitting: Obstetrics and Gynecology

## 2021-05-18 ENCOUNTER — Ambulatory Visit (INDEPENDENT_AMBULATORY_CARE_PROVIDER_SITE_OTHER): Payer: Medicare HMO | Admitting: Obstetrics and Gynecology

## 2021-05-18 VITALS — BP 110/76 | HR 81 | Ht 66.0 in | Wt 124.0 lb

## 2021-05-18 DIAGNOSIS — M810 Age-related osteoporosis without current pathological fracture: Secondary | ICD-10-CM

## 2021-05-18 DIAGNOSIS — Z9189 Other specified personal risk factors, not elsewhere classified: Secondary | ICD-10-CM

## 2021-05-18 DIAGNOSIS — Z01419 Encounter for gynecological examination (general) (routine) without abnormal findings: Secondary | ICD-10-CM

## 2021-05-18 MED ORDER — RALOXIFENE HCL 60 MG PO TABS: 60.0000 mg | ORAL_TABLET | Freq: Every day | ORAL | 3 refills | Status: DC

## 2021-05-18 NOTE — Telephone Encounter (Signed)
Scheduled 03/24/22

## 2021-05-18 NOTE — Telephone Encounter (Signed)
Please schedule breast MRI with contrast for my patient.  She has increased lifetime risk of breast cancer over 20%.

## 2021-05-18 NOTE — Telephone Encounter (Signed)
Order placed, pt notified and voiced understanding to call and get appt scheduled.

## 2021-05-18 NOTE — Patient Instructions (Signed)

## 2021-05-28 ENCOUNTER — Other Ambulatory Visit: Payer: Self-pay | Admitting: Internal Medicine

## 2021-05-28 ENCOUNTER — Encounter: Payer: Self-pay | Admitting: Internal Medicine

## 2021-05-28 ENCOUNTER — Encounter: Payer: Self-pay | Admitting: Obstetrics and Gynecology

## 2021-05-28 ENCOUNTER — Other Ambulatory Visit: Payer: Self-pay | Admitting: Obstetrics and Gynecology

## 2021-05-28 DIAGNOSIS — G8929 Other chronic pain: Secondary | ICD-10-CM

## 2021-05-28 DIAGNOSIS — M545 Low back pain, unspecified: Secondary | ICD-10-CM

## 2021-05-28 MED ORDER — LORAZEPAM 1 MG PO TABS: ORAL_TABLET | ORAL | 0 refills | Status: DC

## 2021-05-28 NOTE — Progress Notes (Signed)
Rx for Ativan, 1 mg, to take one hour prior to breast MRI.

## 2021-05-28 NOTE — Telephone Encounter (Signed)
[name] don't see that Diane Day informed you that patient has been scheduled to check for pre cert.. Just wanted you to know as well.

## 2021-05-30 ENCOUNTER — Other Ambulatory Visit: Payer: Medicare HMO

## 2021-06-03 ENCOUNTER — Ambulatory Visit
Admission: RE | Admit: 2021-06-03 | Discharge: 2021-06-03 | Disposition: A | Discharge status: Home / Self Care | Payer: Medicare HMO | Source: Ambulatory Visit | Attending: Obstetrics and Gynecology | Admitting: Obstetrics and Gynecology

## 2021-06-03 ENCOUNTER — Other Ambulatory Visit: Payer: Self-pay

## 2021-06-03 DIAGNOSIS — Z803 Family history of malignant neoplasm of breast: Secondary | ICD-10-CM | POA: Diagnosis not present

## 2021-06-03 DIAGNOSIS — Z9189 Other specified personal risk factors, not elsewhere classified: Secondary | ICD-10-CM

## 2021-06-03 MED ORDER — GADOBUTROL 1 MMOL/ML IV SOLN
5.0000 mL | Freq: Once | INTRAVENOUS | Status: AC | PRN
  Administered 2021-06-03: 5 mL via INTRAVENOUS

## 2021-06-10 IMAGING — MR MR BREAST BILAT WO/W CM
8 of 12 series · 34 of 48 positions shown · IV contrast (5 ML GADAVIST)
Comparison: Breast MRI dated 05/22/2019.

CLINICAL DATA: Strong family history of breast cancer. High risk
screening breast MRI.

LABS:  Not applicable
EXAM:
BILATERAL BREAST MRI WITH AND WITHOUT CONTRAST
TECHNIQUE: Multiplanar, multisequence MR images of both breasts were obtained
prior to and following the intravenous administration of 5 ml of
Gadavist

[Series 2: t2_tirm_tra ipat (a-p) · axial · 3.0mm · 0.66mm/px · 1 of 55 slices shown]
[im 1/55]
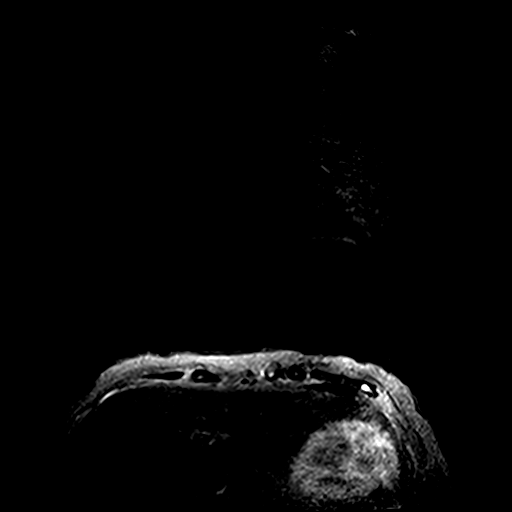

[Series 3: fl3d pre-cm no · axial · non-contrast · 1.2mm · 0.89mm/px · z∈[-99,+73]mm · 5 of 144 slices shown]
[im 1/144]
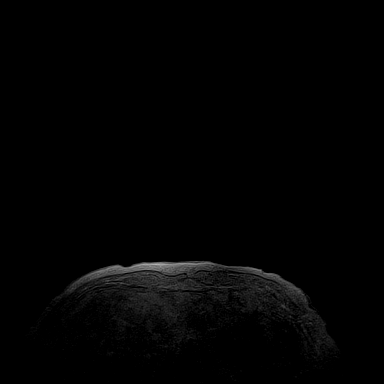
[im 36/144]
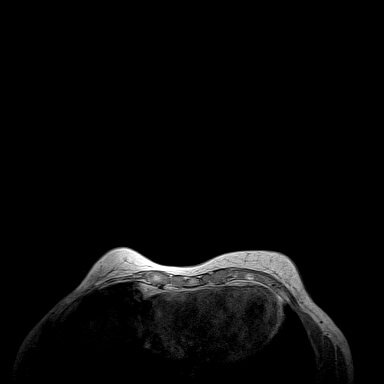
[im 72/144]
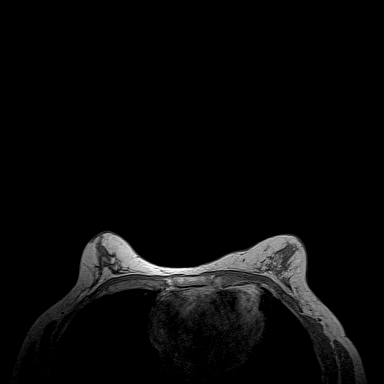
[im 108/144]
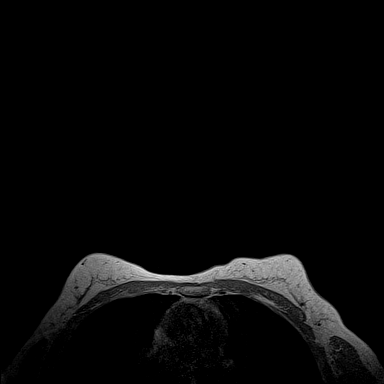
[im 144/144]
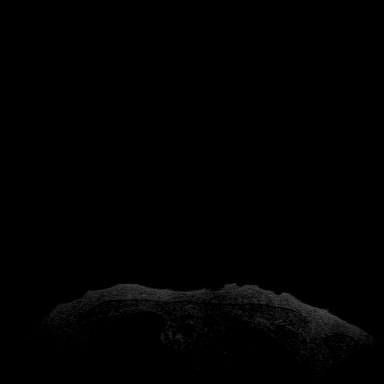

[Series 4: fl3d pre-cm · axial · non-contrast · 1.2mm · 0.89mm/px · z∈[-99,+73]mm · 5 of 144 slices shown]
[im 1/144]
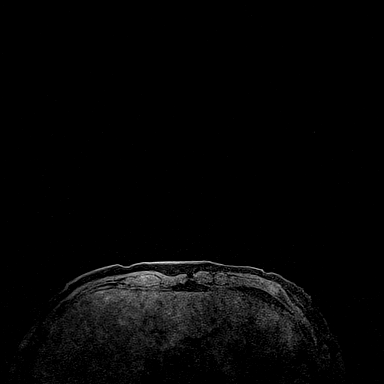
[im 36/144]
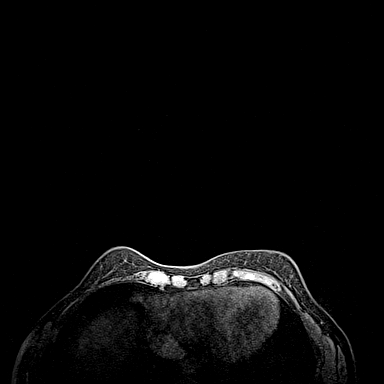
[im 72/144]
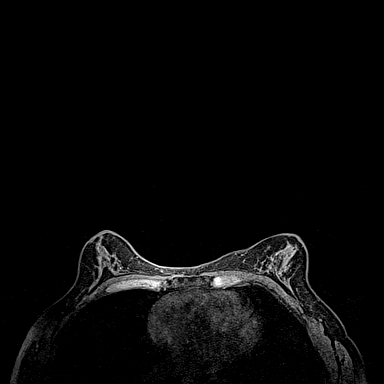
[im 108/144]
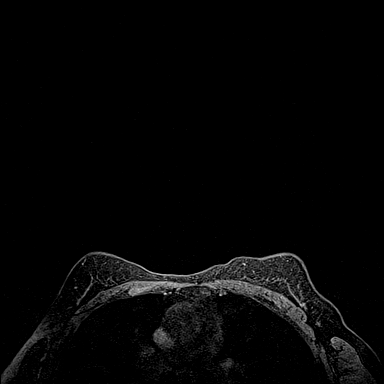
[im 144/144]
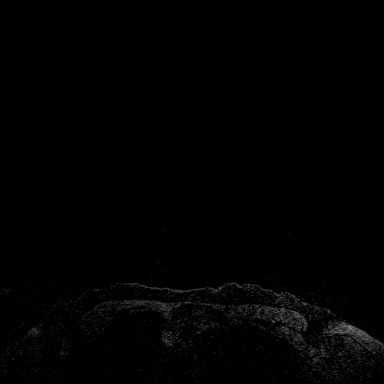

[Series 5: fl3d post-cm 20 · axial · 1.2mm · 0.89mm/px · z∈[-99,+73]mm · 5 of 144 slices shown (1 of 3)]
[im 1/144]
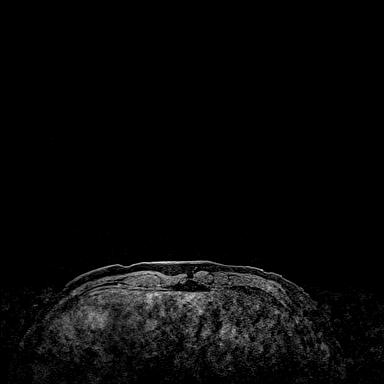
[im 36/144]
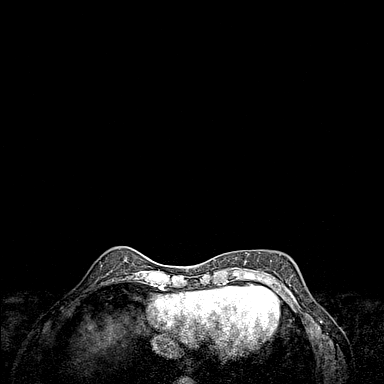
[im 72/144]
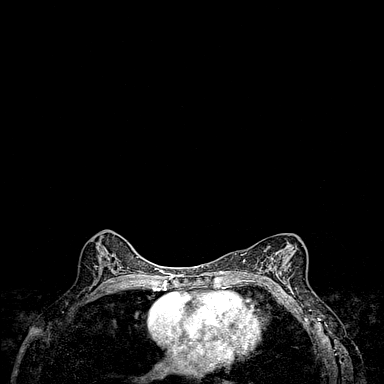
[im 108/144]
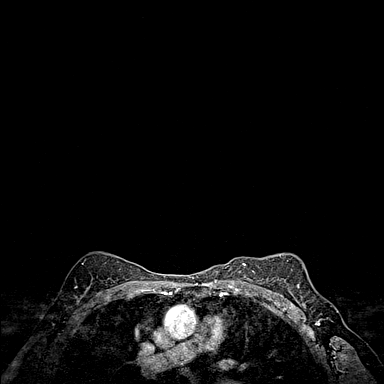
[im 144/144]
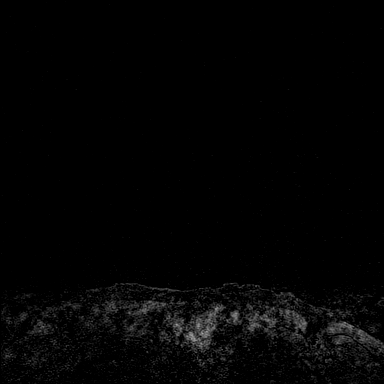

[Series 6: fl3d post-cm 20 · axial · 1.2mm · 0.89mm/px · z∈[-99,+73]mm · 5 of 144 slices shown (2 of 3)]
[im 1/144]
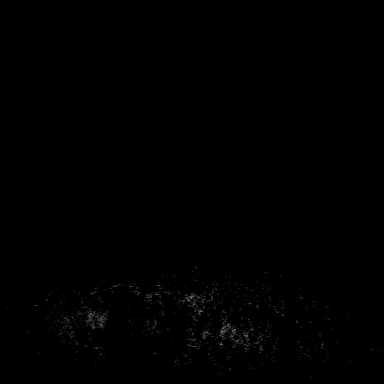
[im 36/144]
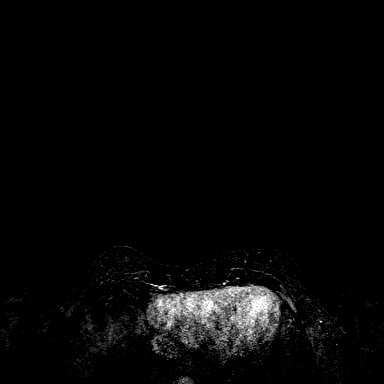
[im 72/144]
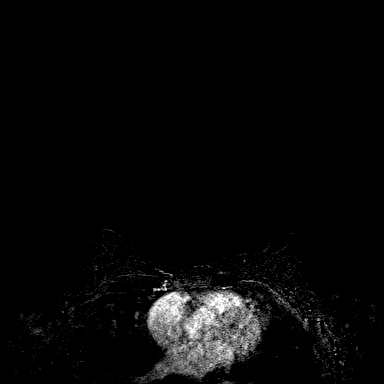
[im 108/144]
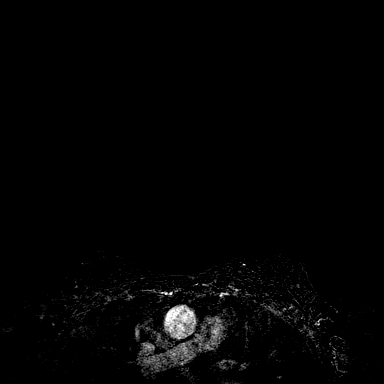
[im 144/144]
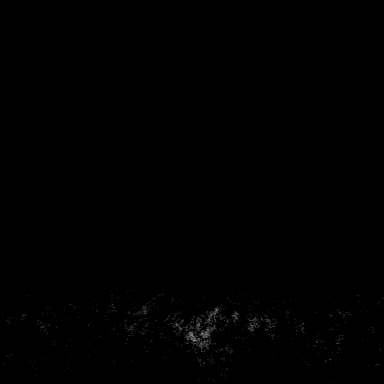

[Series 7: fl3d post-cm 20 · axial · 172.8mm · 0.89mm/px · 1 of 1 slices shown (3 of 3)]
[im 1/1]
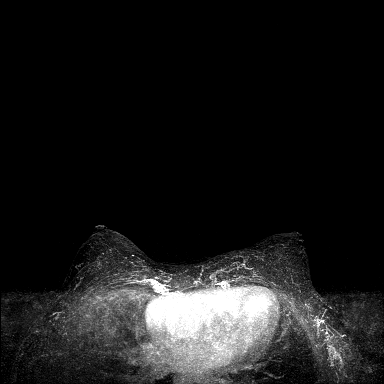

[Series 8: fl3d post-cm 3min · axial · 1.2mm · 0.89mm/px · z∈[-99,+73]mm · 6 of 144 slices shown]
[im 1/144]
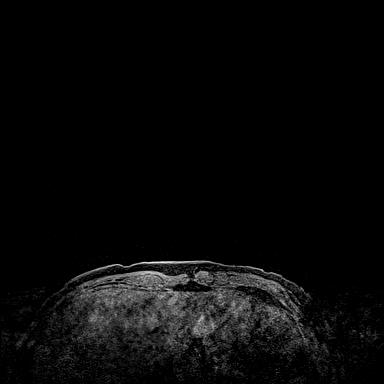
[im 29/144]
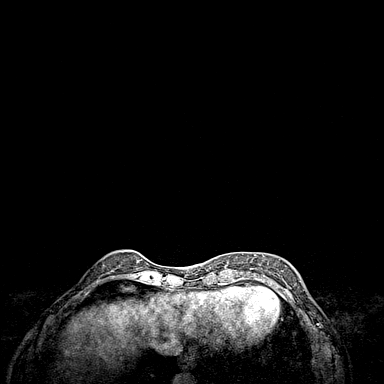
[im 58/144]
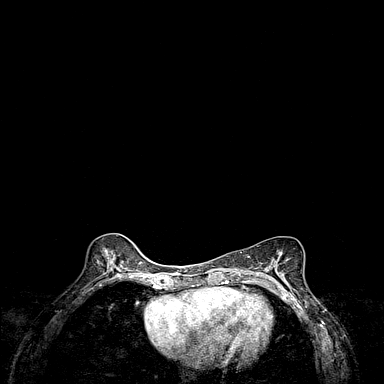
[im 86/144]
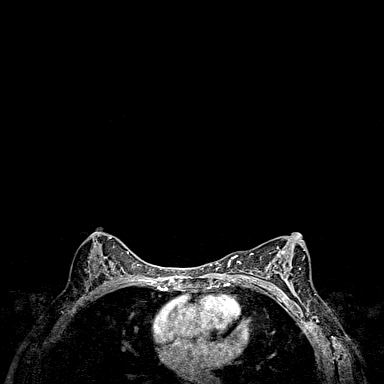
[im 115/144]
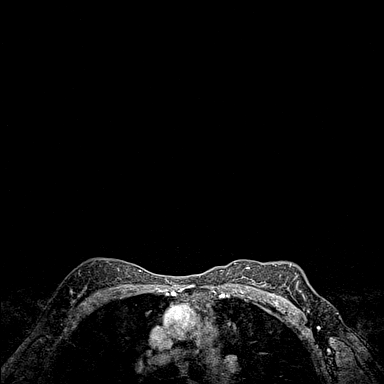
[im 144/144]
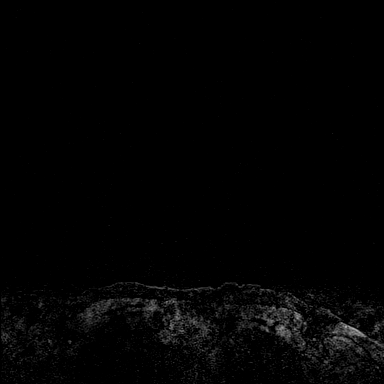

[Series 9: fl3d post-cm 3min_sub · axial · 1.2mm · 0.89mm/px · z∈[-99,+73]mm · 6 of 144 slices shown]
[im 1/144]
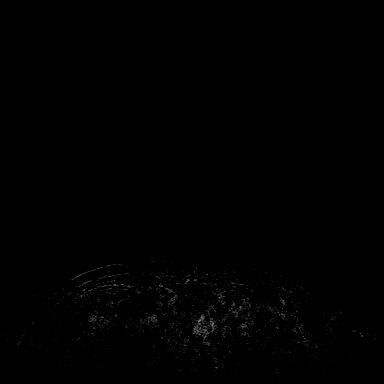
[im 29/144]
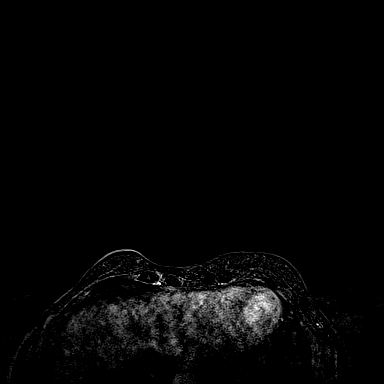
[im 58/144]
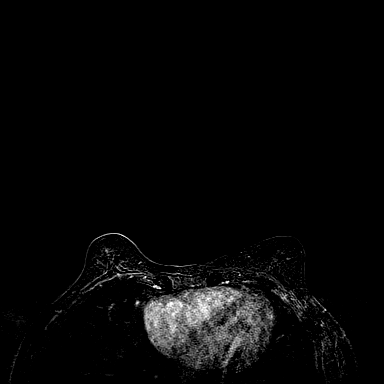
[im 86/144]
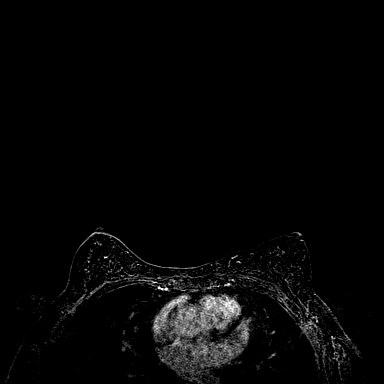
[im 115/144]
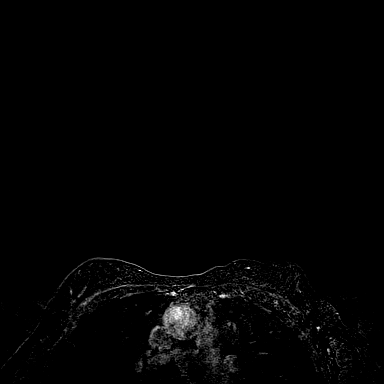
[im 144/144]
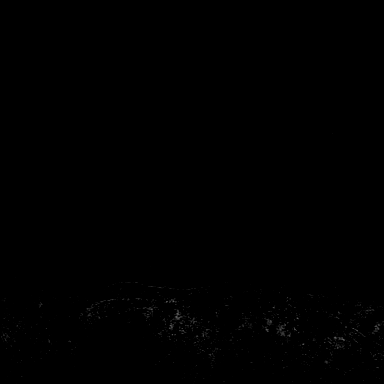

[34 of 48 positions shown; findings below may reference images not displayed]

Three-dimensional MR images were rendered by post-processing of the
original MR data on an independent workstation. The
three-dimensional MR images were interpreted, and findings are
reported in the following complete MRI report for this study. Three
dimensional images were evaluated at the independent interpreting
workstation using the DynaCAD thin client.
FINDINGS: Breast composition: b. Scattered fibroglandular tissue.

Background parenchymal enhancement: Mild

Right breast: No mass or abnormal enhancement.

Left breast: No mass or abnormal enhancement.

Lymph nodes: No abnormal appearing lymph nodes.

Ancillary findings:  None.
IMPRESSION: No MRI evidence of malignancy within either breast.

RECOMMENDATION:
Continued annual screening mammograms and annual screening breast
MRI.

BI-RADS CATEGORY  1: Negative.

## 2021-06-15 NOTE — Therapy (Signed)
OUTPATIENT PHYSICAL THERAPY THORACOLUMBAR EVALUATION   Patient Name: Diane Day MRN: [id number] DOB:[birth year], 71 y.o., female Today's Date: 06/16/2021   PT End of Session - 06/16/21 1640     Visit Number 1    Number of Visits 5    Date for PT Re-Evaluation 08/06/21    Authorization Type [employer] MEDICARE HMO/PPO    PT Start Time 0804    PT Stop Time 0850    PT Time Calculation (min) 46 min    Activity Tolerance Patient tolerated treatment well    Behavior During Therapy WFL for tasks assessed/performed             Past Medical History:  Diagnosis Date   Cancer ([place])    basal cell, face   Family history of breast cancer    Hypertension    Osteopenia    Osteoporosis    Rectal bleeding    Past Surgical History:  Procedure Laterality Date   BREAST SURGERY Left 1998   remove fibroadenoma   KNEE ARTHROSCOPY Left    repair broken arm  2008   [place]   Patient Active Problem List   Diagnosis Date Noted   Encounter for general adult medical examination with abnormal findings 05/01/2020   Achilles tendonitis, bilateral 04/23/2019   Osteoporosis without current pathological fracture 03/23/2016   Family history of breast cancer 01/01/2014   Snoring 09/19/2013   Pure hypercholesterolemia 12/02/2011   Essential hypertension 09/19/2008    PCP: Janith Lima, MD  REFERRING PROVIDER: Janith Lima, MD  REFERRING DIAG: Chronic bilateral low back pain without sciatica  THERAPY DIAG:  Pain in thoracic spine  Cramp and spasm  ONSET DATE: 72+ years old  SUBJECTIVE:                                                                                                                                                                                           SUBJECTIVE STATEMENT: Pt reports occasional L mid back and upper low back spasms and pain which limits her ability to function well for 1 to 3 days. Pt notes she had 2 episodes over the past few  months. This is an increase in frequency with her normally only having 1 episode a year. Pt works in [employer], trade [employer] and she thinks increased forward UE use seems to be a trigger for her flare ups. Sometimes she able to manage the episode with heat, massage, medications, and foam roller. Pt enjoys swimmimg for exercise, primarily swimming free style.  PERTINENT HISTORY:  Osteopenia, osteoporosis, RTC issues in the [employer] MEDICARE HMO/PPOpast  PAIN:  Are you  having pain? No NPRS scale: 0/10, when it occurs Pain location: L mid back underneath the shoulder blade Pain orientation: Left  PAIN TYPE: occasional Pain description:  spasm   Aggravating factors: Forward use of UEs  Relieving factors: heat, massage, meds, foam roller  PRECAUTIONS: Osteopenia, osteoporosis  WEIGHT BEARING RESTRICTIONS No  FALLS:  Has patient fallen in last 6 months? No, Number of falls: 0  LIVING ENVIRONMENT: Lives with: lives with their family Lives in: House/apartment  OCCUPATION: Retail  PLOF: Independent  PATIENT GOALS : Pt would like to see if an HEP and dry needling will help her area of increased muscle tension and pain   OBJECTIVE:   DIAGNOSTIC FINDINGS:  NA  PATIENT SURVEYS:  FOTO 54% functional status, 70% predicted  SCREENING FOR RED FLAGS: Bowel or bladder incontinence: No Cauda equina syndrome: No  COGNITION:  Overall cognitive status: Within functional limits for tasks assessed     SENSATION:  Light touch: Appears intact  MUSCLE LENGTH: Hamstrings: Right  WNLs; Left  WNLs Thomas test: Right  WNLs; Left  WNLs  POSTURE:  Forward head c min CT step off  PALPATION: TTP of the L mid thoracic paraspinals with increased muscle tension in comparison to the R and reproducing pt's area of pain  LUMBAR and Cervical AROM- Both grossly WNLs  LUMBAR AROM A/PROM  06/16/2021  Flexion Full  Extension Full  Right lateral flexion Min limitation  Left lateral flexion Min limitation   Right rotation Full  Left rotation Full     CERVICAL AROM A/PROM  06/16/2021  Flexion Min limitation  Extension Min limitation  Right lateral flexion Min Limitation  Left lateral flexion Min limitation  Right rotation Min limitation  Left rotation Min limitation    LE and UE MMT: Both grossly WNLs c negative myotomal screen  LUMBAR SPECIAL TESTS:  Slump test: Negative   CERVICAL SPECIAL TESTS:  Spurling Test: Negative  GAIT: Distance walked: 200' in clinic Assistive device utilized: None Level of assistance: Complete Independence Comments: WNLs    TODAY'S TREATMENT  Foam roller routine- Shoulder horizontal abd, shoulder ER c GTB   PATIENT EDUCATION:  Education details: HEP, POC, benefits/limitations of strengthening and flexibility exs and TPDN ( may or may not prevent future episodes of mid back pain), to swimming back stroke as well Person educated: Patient Education method: Explanation, Demonstration, Tactile cues, Verbal cues, and Handouts Education comprehension: verbalized understanding, returned demonstration, verbal cues required, and tactile cues required   HOME EXERCISE PROGRAM: Access Code: [email or web address] URL: [email or web address] Date: 06/17/2021 With foam roller Exercises Standing Shoulder Horizontal Abduction with Resistance - 1 x daily - 7 x weekly - 2-3 sets - 10 reps - 3 hold Shoulder External Rotation and Scapular Retraction with Resistance - 1 x daily - 7 x weekly - 2-3 sets - 10 reps - 3 hold  ASSESSMENT:  CLINICAL IMPRESSION: Patient is a 71 y.o. F who was seen today for physical therapy evaluation and treatment for L mid back pain and spasms which limits her ability to function well impacting her quality of life. Objective impairments include increased muscle spasms, impaired UE functional use, and pain. These impairments are limiting patient from cleaning, meal prep, occupation, laundry, and shopping. Personal factors including  Past/current experiences and Time since onset of injury/illness/exacerbation are also affecting patient's functional outcome. Patient will benefit from skilled PT to address above impairments and improve overall function.  REHAB POTENTIAL: Excellent  CLINICAL DECISION MAKING: Stable/uncomplicated  EVALUATION COMPLEXITY: Low  LONG TERM GOALS:   LTG Name Target Date Goal status  1 Pt will voice understanding of measures to assist in decreasing and managing pain Baseline: 08/06/21 INITIAL  2 Pt  will report a decrease in the intensity of pain to 5/10 or less during an episode Baseline: 08/06/21 INITIAL  3 Pt's FOTO score for functional status will improve to 70% Baseline: 54% 08/06/21 INITIAL  4 Pt will be Ind in a final HEP to maintain achieve LOF Baseline: 08/06/21 INITIAL   PLAN: PT FREQUENCY: 1x/week  PT DURATION: 4 weeks  PLANNED INTERVENTIONS: Therapeutic exercises, Therapeutic activity, Patient/Family education, Joint mobilization, Dry Needling, Electrical stimulation, Spinal mobilization, Cryotherapy, Moist heat, Taping, Ultrasound, Ionotophoresis 4mg /ml Dexamethasone, and Manual therapy  PLAN FOR NEXT SESSION: Assess response to HEP, progress therex and use modalities and/or manual therapy as indicated  [employer] MS, PT 06/17/21 7:23 AM

## 2021-06-16 ENCOUNTER — Other Ambulatory Visit: Payer: Self-pay

## 2021-06-16 ENCOUNTER — Ambulatory Visit: Payer: Medicare HMO | Attending: Internal Medicine

## 2021-06-16 DIAGNOSIS — M546 Pain in thoracic spine: Secondary | ICD-10-CM | POA: Insufficient documentation

## 2021-06-16 DIAGNOSIS — M545 Low back pain, unspecified: Secondary | ICD-10-CM | POA: Diagnosis not present

## 2021-06-16 DIAGNOSIS — G8929 Other chronic pain: Secondary | ICD-10-CM | POA: Insufficient documentation

## 2021-06-16 DIAGNOSIS — R252 Cramp and spasm: Secondary | ICD-10-CM | POA: Diagnosis not present

## 2021-06-23 ENCOUNTER — Ambulatory Visit: Payer: Medicare HMO

## 2021-06-24 DIAGNOSIS — H2513 Age-related nuclear cataract, bilateral: Secondary | ICD-10-CM | POA: Diagnosis not present

## 2021-06-24 DIAGNOSIS — H5213 Myopia, bilateral: Secondary | ICD-10-CM | POA: Diagnosis not present

## 2021-06-24 DIAGNOSIS — H35371 Puckering of macula, right eye: Secondary | ICD-10-CM | POA: Diagnosis not present

## 2021-06-29 NOTE — Therapy (Signed)
OUTPATIENT PHYSICAL THERAPY TREATMENT NOTE   Patient Name: Diane Day MRN: [id number] DOB:1950-09-15, 71 y.o., female Today's Date: 06/30/2021  PCP: Janith Lima, MD REFERRING PROVIDER: Janith Lima, MD   PT End of Session - 06/30/21 0846     Visit Number 2    Number of Visits 5    Date for PT Re-Evaluation 08/06/21    Authorization Type [employer] MEDICARE HMO/PPO    PT Start Time 0850    PT Stop Time 0933    PT Time Calculation (min) 43 min    Activity Tolerance Patient tolerated treatment well    Behavior During Therapy [facility] for tasks assessed/performed             Past Medical History:  Diagnosis Date   Cancer ([place])    basal cell, face   Family history of breast cancer    Hypertension    Osteopenia    Osteoporosis    Rectal bleeding    Past Surgical History:  Procedure Laterality Date   BREAST SURGERY Left 1998   remove fibroadenoma   KNEE ARTHROSCOPY Left    repair broken arm  2008   [place]   Patient Active Problem List   Diagnosis Date Noted   Encounter for general adult medical examination with abnormal findings 05/01/2020   Achilles tendonitis, bilateral 04/23/2019   Osteoporosis without current pathological fracture 03/23/2016   Family history of breast cancer 01/01/2014   Snoring 09/19/2013   Pure hypercholesterolemia 12/02/2011   Essential hypertension 09/19/2008    REFERRING DIAG: Chronic bilateral low back pain without sciatica  THERAPY DIAG:  Pain in thoracic spine  Cramp and spasm  SUBJECTIVE:                                                                                                                                                                                            SUBJECTIVE STATEMENT: Pt reports she is using a soft foam, a theacane and a spikey ball muscle tightness management. Pt notes she has been completing her HEP.   PAIN:  Are you having pain? No NPRS scale: 0/10 Pain location: L mid back  underneath the shoulder blade Pain orientation: Left  PAIN TYPE: occasional Pain description:  spasm   Aggravating factors: Forward use of UEs  Relieving factors: heat, massage, meds, foam roller  PERTINENT HISTORY:  Osteopenia, osteoporosis, RTC issues in the [employer]ARE HMO/PPO   PRECAUTIONS: Osteopenia, osteoporosis   PATIENT GOALS : Pt would like to see if an HEP and dry needling will help her area of increased muscle tension and pain  OBJECTIVE:    DIAGNOSTIC FINDINGS:  NA   PATIENT SURVEYS:  FOTO 54% functional status, 70% predicted   SCREENING FOR RED FLAGS: Bowel or bladder incontinence: No Cauda equina syndrome: No   COGNITION:          Overall cognitive status: Within functional limits for tasks assessed                        SENSATION:          Light touch: Appears intact   MUSCLE LENGTH: Hamstrings: Right  WNLs; Left  WNLs Thomas test: Right  WNLs; Left  WNLs   POSTURE:  Forward head c min CT step off   PALPATION: TTP of the L mid thoracic paraspinals with increased muscle tension in comparison to the R and reproducing pt's area of pain   LUMBAR and Cervical AROM- Both grossly WNLs   LUMBAR AROM A/PROM  06/16/2021  Flexion Full  Extension Full  Right lateral flexion Min limitation  Left lateral flexion Min limitation  Right rotation Full  Left rotation Full      CERVICAL AROM A/PROM  06/16/2021  Flexion Min limitation  Extension Min limitation  Right lateral flexion Min Limitation  Left lateral flexion Min limitation  Right rotation Min limitation  Left rotation Min limitation      LE and UE MMT: Both grossly WNLs c negative myotomal screen   LUMBAR SPECIAL TESTS:  Slump test: Negative             CERVICAL SPECIAL TESTS:           Spurling Test: Negative   GAIT: Distance walked: 200' in clinic Assistive device utilized: None Level of assistance: Complete Independence Comments: WNLs       TODAY'S TREATMENT : [facility] Adult  PT Treatment:                                                DATE: 06/30/21 Therapeutic Exercise: Foam roller routine- Cervical retraction, Shoulder horizontal abd, shoulder ER, D2 diagonal c GTB 2x15 Cervical retration x5, 3 sec Scapular retraction x5, 3 sec Seated flexion c theraball forward and laterally x2 20" each Seated QL stretch x2 20" L and R Thoracic reach through stretch at counter x2 20" L and R  Self Care: Updated HEP for strengthening and stretching exs. Written program provided. Pt returned proper demonstration.    Eval TREATMENT: Foam roller routine- Shoulder horizontal abd, shoulder ER GTB     PATIENT EDUCATION:  Education details: HEP, POC, benefits/limitations of strengthening and flexibility exs and TPDN ( may or may not prevent future episodes of mid back pain), to swimming back stroke as well Person educated: Patient Education method: Explanation, Demonstration, Tactile cues, Verbal cues, and Handouts Education comprehension: verbalized understanding, returned demonstration, verbal cues required, and tactile cues required    Access Code: [email or web address] URL: [email or web address] Date: 06/30/2021 Prepared by: Gar Ponto  Exercises Standing Shoulder Horizontal Abduction with Resistance - 1 x daily - 7 x weekly - 2-3 sets - 10-15 reps - 3 hold Shoulder External Rotation and Scapular Retraction with Resistance - 1 x daily - 7 x weekly - 2-3 sets - 10-15 reps - 3 hold Standing Shoulder Single Arm PNF D2 Flexion with Resistance - 1 x daily - 7 x weekly - 3 sets - 10-15  reps Seated Scapular Retraction - 6 x daily - 7 x weekly - 1 sets - 3-5 reps - 3 hold Seated Passive Cervical Retraction - 6 x daily - 7 x weekly - 1 sets - 3-5 reps - 3 hold Seated Flexion Stretch with Swiss Ball - 1 x daily - 7 x weekly - 1 sets - 3-5 reps - 20 hold Seated Quadratus Lumborum Stretch in Chair - 1 x daily - 7 x weekly - 1 sets - 3-5 reps - 20 hold Quadruped Thoracic Rotation  - Reach Under - 1 x daily - 7 x weekly - 1 sets - 3-5 reps - 20 hold    ASSESSMENT:   CLINICAL IMPRESSION: PT was completed for thoracolumbar flexibility and strengthening and developing a HEP. Pt returns to PT after the eval 2 weeks ago and reports she has been consistent with completing her HEP. Pt denies R mid back pain and palpation to the L mid back area reveals decreased muscle tension vs on eval. Pt is to return to PT for 1 additional visit to assess response to HEP and progress as indicated. If pt continues to do well, anticipate DC from T at that time.    IMPAIRMENTS:   Increased muscle spasms, impaired UE functional use, and pain.  STGs=LTGs  LONG TERM GOALS:    LTG Name Target Date Goal status  1 Pt will voice understanding of measures to assist in decreasing and managing pain Baseline: 08/06/21 INITIAL  2 Pt  will report a decrease in the intensity of pain to 5/10 or less during an episode Baseline: 08/06/21 INITIAL  3 Pt's FOTO score for functional status will improve to 70% Baseline: 54% 08/06/21 INITIAL  4 Pt will be Ind in a final HEP to maintain achieve LOF Baseline: 08/06/21 INITIAL    PLAN: PT FREQUENCY: 1x/week   PT DURATION: 4 weeks   PLANNED INTERVENTIONS: Therapeutic exercises, Therapeutic activity, Patient/Family education, Joint mobilization, Dry Needling, Electrical stimulation, Spinal mobilization, Cryotherapy, Moist heat, Taping, Ultrasound, Ionotophoresis 4mg /ml Dexamethasone, and Manual therapy   PLAN FOR NEXT SESSION: Possible DC from PT if pt continues to do well   [employer] MS, PT 06/30/21 6:15 PM

## 2021-06-30 ENCOUNTER — Other Ambulatory Visit: Payer: Self-pay

## 2021-06-30 ENCOUNTER — Ambulatory Visit: Payer: Medicare HMO

## 2021-06-30 DIAGNOSIS — R252 Cramp and spasm: Secondary | ICD-10-CM

## 2021-06-30 DIAGNOSIS — M545 Low back pain, unspecified: Secondary | ICD-10-CM | POA: Diagnosis not present

## 2021-06-30 DIAGNOSIS — G8929 Other chronic pain: Secondary | ICD-10-CM | POA: Diagnosis not present

## 2021-06-30 DIAGNOSIS — M546 Pain in thoracic spine: Secondary | ICD-10-CM | POA: Diagnosis not present

## 2021-07-06 ENCOUNTER — Ambulatory Visit: Payer: Medicare HMO

## 2021-07-06 ENCOUNTER — Other Ambulatory Visit: Payer: Self-pay

## 2021-07-06 DIAGNOSIS — G8929 Other chronic pain: Secondary | ICD-10-CM | POA: Diagnosis not present

## 2021-07-06 DIAGNOSIS — M546 Pain in thoracic spine: Secondary | ICD-10-CM

## 2021-07-06 DIAGNOSIS — M545 Low back pain, unspecified: Secondary | ICD-10-CM | POA: Diagnosis not present

## 2021-07-06 DIAGNOSIS — R252 Cramp and spasm: Secondary | ICD-10-CM

## 2021-07-06 NOTE — Therapy (Signed)
OUTPATIENT PHYSICAL THERAPY TREATMENT NOTE/Discharge   Patient Name: Diane Day MRN: [id number] DOB:1951-03-04, 71 y.o., female Today's Date: 07/06/2021  PCP: Janith Lima, MD REFERRING PROVIDER: Janith Lima, MD   PT End of Session - 07/06/21 [id number]     Visit Number 3    Number of Visits 5    Date for PT Re-Evaluation 08/06/21    Authorization Type [employer] MEDICARE HMO/PPO    PT Start Time 0806    PT Stop Time 0850    PT Time Calculation (min) 44 min    Activity Tolerance Patient tolerated treatment well    Behavior During Therapy [facility] for tasks assessed/performed              Past Medical History:  Diagnosis Date   Cancer ([place])    basal cell, face   Family history of breast cancer    Hypertension    Osteopenia    Osteoporosis    Rectal bleeding    Past Surgical History:  Procedure Laterality Date   BREAST SURGERY Left 1998   remove fibroadenoma   KNEE ARTHROSCOPY Left    repair broken arm  2008   [place]   Patient Active Problem List   Diagnosis Date Noted   Encounter for general adult medical examination with abnormal findings 05/01/2020   Achilles tendonitis, bilateral 04/23/2019   Osteoporosis without current pathological fracture 03/23/2016   Family history of breast cancer 01/01/2014   Snoring 09/19/2013   Pure hypercholesterolemia 12/02/2011   Essential hypertension 09/19/2008    REFERRING DIAG: Chronic bilateral low back pain without sciatica  THERAPY DIAG:  Pain in thoracic spine  Cramp and spasm  SUBJECTIVE:                                                                                                                                                                                            SUBJECTIVE STATEMENT: Pt reports she is not been having mid back and her HEP is going well.   PAIN:  Are you having pain? No NPRS scale: 0/10 Pain location: L mid back underneath the shoulder blade Pain orientation: Left   PAIN TYPE: occasional Pain description:  spasm   Aggravating factors: Forward use of UEs  Relieving factors: heat, massage, meds, foam roller  PERTINENT HISTORY:  Osteopenia, osteoporosis, RTC issues in the [place]: Osteopenia, osteoporosis   PATIENT GOALS : Pt would like to see if an HEP and dry needling will help her area of increased muscle tension and pain     OBJECTIVE:    DIAGNOSTIC FINDINGS:  NA   PATIENT SURVEYS:  FOTO 54% functional status, 70% predicted   SCREENING FOR RED FLAGS: Bowel or bladder incontinence: No Cauda equina syndrome: No   COGNITION:          Overall cognitive status: Within functional limits for tasks assessed                        SENSATION:          Light touch: Appears intact   MUSCLE LENGTH: Hamstrings: Right  WNLs; Left  WNLs Thomas test: Right  WNLs; Left  WNLs   POSTURE:  Forward head c min CT step off   PALPATION: TTP of the L mid thoracic paraspinals with increased muscle tension in comparison to the R and reproducing pt's area of pain   LUMBAR and Cervical AROM- Both grossly WNLs   LUMBAR AROM A/PROM  06/16/2021  Flexion Full  Extension Full  Right lateral flexion Min limitation  Left lateral flexion Min limitation  Right rotation Full  Left rotation Full      CERVICAL AROM A/PROM  06/16/2021  Flexion Min limitation  Extension Min limitation  Right lateral flexion Min Limitation  Left lateral flexion Min limitation  Right rotation Min limitation  Left rotation Min limitation      LE and UE MMT: Both grossly WNLs c negative myotomal screen   LUMBAR SPECIAL TESTS:  Slump test: Negative             CERVICAL SPECIAL TESTS:           Spurling Test: Negative   GAIT: Distance walked: 200' in clinic Assistive device utilized: None Level of assistance: Complete Independence Comments: WNLs    TODAY'S TREATMENT : [facility] Adult PT Treatment:                                                 DATE: 07/06/21 Therapeutic Exercise: UBE 63mn, 2 mins forward and backwards Cybex chest press 3x10 20# Cybex chest fly 3x10 20# Cybex chest pull 3x10 25# Cybex lat pull 3x10 20# FM bicep curl 3x10 13# FM paloff press 3x10 7#  Self Care: Updated HEP  [facility]                                                DATE: 06/30/21 Therapeutic Exercise: Foam roller routine- Cervical retraction, Shoulder horizontal abd, shoulder ER, D2 diagonal c GTB 2x15 Cervical retration x5, 3 sec Scapular retraction x5, 3 sec Seated flexion c theraball forward and laterally x2 20" each Seated QL stretch x2 20" L and R Thoracic reach through stretch at counter x2 20" L and R  Self Care: Updated HEP for strengthening and stretching exs. Written program provided. Pt returned proper demonstration.    Eval TREATMENT: Foam roller routine- Shoulder horizontal abd, shoulder ER GTB     PATIENT EDUCATION:  Education details: HEP, POC, benefits/limitations of strengthening and flexibility exs and TPDN ( may or may not prevent future episodes of mid back pain), to swimming back stroke as well Person educated: Patient Education method: Explanation, Demonstration, Tactile cues, Verbal cues, and Handouts Education comprehension: verbalized understanding, returned demonstration, verbal cues required, and tactile  cues required    Access Code: [email or web address] URL: [email or web address] Date: 06/30/2021 Prepared by: Gar Ponto  Exercises Standing Shoulder Horizontal Abduction with Resistance - 1 x daily - 7 x weekly - 2-3 sets - 10-15 reps - 3 hold Shoulder External Rotation and Scapular Retraction with Resistance - 1 x daily - 7 x weekly - 2-3 sets - 10-15 reps - 3 hold Standing Shoulder Single Arm PNF D2 Flexion with Resistance - 1 x daily - 7 x weekly - 3 sets - 10-15 reps Seated Scapular Retraction - 6 x daily - 7 x weekly - 1 sets - 3-5 reps - 3 hold Seated Passive Cervical Retraction - 6  x daily - 7 x weekly - 1 sets - 3-5 reps - 3 hold Seated Flexion Stretch with Swiss Ball - 1 x daily - 7 x weekly - 1 sets - 3-5 reps - 20 hold Seated Quadratus Lumborum Stretch in Chair - 1 x daily - 7 x weekly - 1 sets - 3-5 reps - 20 hold Quadruped Thoracic Rotation - Reach Under - 1 x daily - 7 x weekly - 1 sets - 3-5 reps - 20 hold    ASSESSMENT:   CLINICAL IMPRESSION: PT was completed for education and completion of upper body strengthening exercise with weight machines which the pt may find in her gym. Pt tolerated the session well and voiced understanding of applying exercise principles. Pt reports she is completing her previously provided HEP. Pt has not experienced any issues with L mid back pain or spasms since the initiation of PT. Pt is Ind c a HEP to assist in the strengthening and flexibility of her mid back to assist in decreasing the frequency and severity of pain in this area.    IMPAIRMENTS:   Increased muscle spasms, impaired UE functional use, and pain.  STGs=LTGs  LONG TERM GOALS:    LTG Name Target Date Goal status  1 Pt will voice understanding of measures to assist in decreasing and managing pain Baseline: 08/06/21 Met 07/06/21  2 Pt  will report a decrease in the intensity of pain to 5/10 or less during an episode Baseline: 08/06/21 Unable to assess with no issues occuring during course of care. 07/06/21   3 Pt's FOTO score for functional status will improve to 70%. Deferred access to FOTO not available Baseline: 54% 08/06/21 Deferred 07/06/21  4 Pt will be Ind in a final HEP to maintain achieve LOF Baseline: 08/06/21 Met 07/06/21    PLAN: PT FREQUENCY: 1x/week   PT DURATION: 4 weeks   PLANNED INTERVENTIONS: Therapeutic exercises, Therapeutic activity, Patient/Family education, Joint mobilization, Dry Needling, Electrical stimulation, Spinal mobilization, Cryotherapy, Moist heat, Taping, Ultrasound, Ionotophoresis 30m/ml Dexamethasone, and Manual therapy    PLAN FOR NEXT SESSION:   PHYSICAL THERAPY DISCHARGE SUMMARY  Visits from Start of Care: 3  Current functional level related to goals / functional outcomes: See above   Remaining deficits: See above   Education / Equipment: HEP, measures to decrease and manage pain   Patient agrees to discharge. Patient goals were met. Patient is being discharged due to being pleased with the current functional level.    Allen Ralls MS, PT 07/06/21 1:32 PM

## 2021-07-14 ENCOUNTER — Encounter: Payer: Self-pay | Admitting: Internal Medicine

## 2021-07-14 ENCOUNTER — Other Ambulatory Visit: Payer: Self-pay | Admitting: Internal Medicine

## 2021-07-14 ENCOUNTER — Ambulatory Visit: Payer: Medicare HMO

## 2021-07-14 DIAGNOSIS — I10 Essential (primary) hypertension: Secondary | ICD-10-CM

## 2021-07-14 MED ORDER — INDAPAMIDE 1.25 MG PO TABS: 1.2500 mg | ORAL_TABLET | Freq: Every day | ORAL | 0 refills | Status: DC

## 2021-08-26 DIAGNOSIS — L821 Other seborrheic keratosis: Secondary | ICD-10-CM | POA: Diagnosis not present

## 2021-08-26 DIAGNOSIS — B351 Tinea unguium: Secondary | ICD-10-CM | POA: Diagnosis not present

## 2021-08-26 DIAGNOSIS — L718 Other rosacea: Secondary | ICD-10-CM | POA: Diagnosis not present

## 2021-08-26 DIAGNOSIS — D2272 Melanocytic nevi of left lower limb, including hip: Secondary | ICD-10-CM | POA: Diagnosis not present

## 2021-08-26 DIAGNOSIS — L814 Other melanin hyperpigmentation: Secondary | ICD-10-CM | POA: Diagnosis not present

## 2021-08-26 DIAGNOSIS — L819 Disorder of pigmentation, unspecified: Secondary | ICD-10-CM | POA: Diagnosis not present

## 2021-08-26 DIAGNOSIS — B353 Tinea pedis: Secondary | ICD-10-CM | POA: Diagnosis not present

## 2021-08-26 DIAGNOSIS — D485 Neoplasm of uncertain behavior of skin: Secondary | ICD-10-CM | POA: Diagnosis not present

## 2021-08-26 DIAGNOSIS — Z85828 Personal history of other malignant neoplasm of skin: Secondary | ICD-10-CM | POA: Diagnosis not present

## 2021-08-26 DIAGNOSIS — L570 Actinic keratosis: Secondary | ICD-10-CM | POA: Diagnosis not present

## 2021-10-05 DIAGNOSIS — B351 Tinea unguium: Secondary | ICD-10-CM | POA: Diagnosis not present

## 2021-10-05 DIAGNOSIS — Z79899 Other long term (current) drug therapy: Secondary | ICD-10-CM | POA: Diagnosis not present

## 2021-10-08 ENCOUNTER — Other Ambulatory Visit: Payer: Self-pay | Admitting: Internal Medicine

## 2021-10-08 DIAGNOSIS — I10 Essential (primary) hypertension: Secondary | ICD-10-CM

## 2022-01-12 ENCOUNTER — Other Ambulatory Visit: Payer: Self-pay | Admitting: Internal Medicine

## 2022-01-12 DIAGNOSIS — I10 Essential (primary) hypertension: Secondary | ICD-10-CM

## 2022-01-13 ENCOUNTER — Encounter: Payer: Self-pay | Admitting: Internal Medicine

## 2022-01-13 DIAGNOSIS — I10 Essential (primary) hypertension: Secondary | ICD-10-CM

## 2022-01-13 MED ORDER — INDAPAMIDE 1.25 MG PO TABS: 1.2500 mg | ORAL_TABLET | Freq: Every day | ORAL | 0 refills | Status: DC

## 2022-01-18 ENCOUNTER — Ambulatory Visit (INDEPENDENT_AMBULATORY_CARE_PROVIDER_SITE_OTHER): Payer: Medicare HMO | Admitting: Internal Medicine

## 2022-01-18 ENCOUNTER — Encounter: Payer: Self-pay | Admitting: Internal Medicine

## 2022-01-18 VITALS — BP 134/86 | HR 75 | Temp 98.2°F | Resp 16 | Wt 130.0 lb

## 2022-01-18 DIAGNOSIS — I10 Essential (primary) hypertension: Secondary | ICD-10-CM | POA: Diagnosis not present

## 2022-01-18 LAB — CBC WITH DIFFERENTIAL/PLATELET
Basophils Absolute: 0.1 10*3/uL (ref 0.0–0.1)
Basophils Relative: 1 % (ref 0.0–3.0)
Eosinophils Absolute: 0 10*3/uL (ref 0.0–0.7)
Eosinophils Relative: 0.1 % (ref 0.0–5.0)
HCT: 38.2 % (ref 36.0–46.0)
Hemoglobin: 12.8 g/dL (ref 12.0–15.0)
Lymphocytes Relative: 32.7 % (ref 12.0–46.0)
Lymphs Abs: 2.2 10*3/uL (ref 0.7–4.0)
MCHC: 33.4 g/dL (ref 30.0–36.0)
MCV: 91.3 fl (ref 78.0–100.0)
Monocytes Absolute: 0.6 10*3/uL (ref 0.1–1.0)
Monocytes Relative: 9 % (ref 3.0–12.0)
Neutro Abs: 3.8 10*3/uL (ref 1.4–7.7)
Neutrophils Relative %: 57.2 % (ref 43.0–77.0)
Platelets: 236 10*3/uL (ref 150.0–400.0)
RBC: 4.18 Mil/uL (ref 3.87–5.11)
RDW: 13.6 % (ref 11.5–15.5)
WBC: 6.6 10*3/uL (ref 4.0–10.5)

## 2022-01-18 LAB — BASIC METABOLIC PANEL
BUN: 12 mg/dL (ref 6–23)
CO2: 27 mEq/L (ref 19–32)
Calcium: 9.4 mg/dL (ref 8.4–10.5)
Chloride: 106 mEq/L (ref 96–112)
Creatinine, Ser: 0.73 mg/dL (ref 0.40–1.20)
GFR: 84.3 mL/min (ref >60.00)
Glucose, Bld: 89 mg/dL (ref 70–99)
Potassium: 4.1 mEq/L (ref 3.5–5.1)
Sodium: 143 mEq/L (ref 135–145)

## 2022-01-18 NOTE — Patient Instructions (Signed)
Hypertension, Adult High blood pressure (hypertension) is when the force of blood pumping through the arteries is too strong. The arteries are the blood vessels that carry blood from the heart throughout the body. Hypertension forces the heart to work harder to pump blood and may cause arteries to become narrow or stiff. Untreated or uncontrolled hypertension can lead to a heart attack, heart failure, a stroke, kidney disease, and other problems. A blood pressure reading consists of a higher number over a lower number. Ideally, your blood pressure should be below 120/80. The first ("top") number is called the systolic pressure. It is a measure of the pressure in your arteries as your heart beats. The second ("bottom") number is called the diastolic pressure. It is a measure of the pressure in your arteries as the heart relaxes. What are the causes? The exact cause of this condition is not known. There are some conditions that result in high blood pressure. What increases the risk? Certain factors may make you more likely to develop high blood pressure. Some of these risk factors are under your control, including: Smoking. Not getting enough exercise or physical activity. Being overweight. Having too much fat, sugar, calories, or salt (sodium) in your diet. Drinking too much alcohol. Other risk factors include: Having a personal history of heart disease, diabetes, high cholesterol, or kidney disease. Stress. Having a family history of high blood pressure and high cholesterol. Having obstructive sleep apnea. Age. The risk increases with age. What are the signs or symptoms? High blood pressure may not cause symptoms. Very high blood pressure (hypertensive crisis) may cause: Headache. Fast or irregular heartbeats (palpitations). Shortness of breath. Nosebleed. Nausea and vomiting. Vision changes. Severe chest pain, dizziness, and seizures. How is this diagnosed? This condition is diagnosed by  measuring your blood pressure while you are seated, with your arm resting on a flat surface, your legs uncrossed, and your feet flat on the floor. The cuff of the blood pressure monitor will be placed directly against the skin of your upper arm at the level of your heart. Blood pressure should be measured at least twice using the same arm. Certain conditions can cause a difference in blood pressure between your right and left arms. If you have a high blood pressure reading during one visit or you have normal blood pressure with other risk factors, you may be asked to: Return on a different day to have your blood pressure checked again. Monitor your blood pressure at home for 1 week or longer. If you are diagnosed with hypertension, you may have other blood or imaging tests to help your health care provider understand your overall risk for other conditions. How is this treated? This condition is treated by making healthy lifestyle changes, such as eating healthy foods, exercising more, and reducing your alcohol intake. You may be referred for counseling on a healthy diet and physical activity. Your health care provider may prescribe medicine if lifestyle changes are not enough to get your blood pressure under control and if: Your systolic blood pressure is above 130. Your diastolic blood pressure is above 80. Your personal target blood pressure may vary depending on your medical conditions, your age, and other factors. Follow these instructions at home: Eating and drinking  Eat a diet that is high in fiber and potassium, and low in sodium, added sugar, and fat. An example of this eating plan is called the DASH diet. DASH stands for Dietary Approaches to Stop Hypertension. To eat this way: Eat   plenty of fresh fruits and vegetables. Try to fill one half of your plate at each meal with fruits and vegetables. Eat whole grains, such as whole-wheat pasta, brown rice, or whole-grain bread. Fill about one  fourth of your plate with whole grains. Eat or drink low-fat dairy products, such as skim milk or low-fat yogurt. Avoid fatty cuts of meat, processed or cured meats, and poultry with skin. Fill about one fourth of your plate with lean proteins, such as fish, chicken without skin, beans, eggs, or tofu. Avoid pre-made and processed foods. These tend to be higher in sodium, added sugar, and fat. Reduce your daily sodium intake. Many people with hypertension should eat less than 1,500 mg of sodium a day. Do not drink alcohol if: Your health care provider tells you not to drink. You are pregnant, may be pregnant, or are planning to become pregnant. If you drink alcohol: Limit how much you have to: 0-1 drink a day for women. 0-2 drinks a day for men. Know how much alcohol is in your drink. In the [place] one drink equals one 12 oz bottle of beer (355 mL), one 5 oz glass of wine (148 mL), or one 1 oz glass of hard liquor (44 mL). Lifestyle  Work with your health care provider to maintain a healthy body weight or to lose weight. Ask what an ideal weight is for you. Get at least 30 minutes of exercise that causes your heart to beat faster (aerobic exercise) most days of the week. Activities may include walking, swimming, or biking. Include exercise to strengthen your muscles (resistance exercise), such as Pilates or lifting weights, as part of your weekly exercise routine. Try to do these types of exercises for 30 minutes at least 3 days a week. Do not use any products that contain nicotine or tobacco. These products include cigarettes, chewing tobacco, and vaping devices, such as e-cigarettes. If you need help quitting, ask your health care provider. Monitor your blood pressure at home as told by your health care provider. Keep all follow-up visits. This is important. Medicines Take over-the-counter and prescription medicines only as told by your health care provider. Follow directions carefully. Blood  pressure medicines must be taken as prescribed. Do not skip doses of blood pressure medicine. Doing this puts you at risk for problems and can make the medicine less effective. Ask your health care provider about side effects or reactions to medicines that you should watch for. Contact a health care provider if you: Think you are having a reaction to a medicine you are taking. Have headaches that keep coming back (recurring). Feel dizzy. Have swelling in your ankles. Have trouble with your vision. Get help right away if you: Develop a severe headache or confusion. Have unusual weakness or numbness. Feel faint. Have severe pain in your chest or abdomen. Vomit repeatedly. Have trouble breathing. These symptoms may be an emergency. Get help right away. Call 911. Do not wait to see if the symptoms will go away. Do not drive yourself to the hospital. Summary Hypertension is when the force of blood pumping through your arteries is too strong. If this condition is not controlled, it may put you at risk for serious complications. Your personal target blood pressure may vary depending on your medical conditions, your age, and other factors. For most people, a normal blood pressure is less than 120/80. Hypertension is treated with lifestyle changes, medicines, or a combination of both. Lifestyle changes include losing weight, eating a healthy,   low-sodium diet, exercising more, and limiting alcohol. This information is not intended to replace advice given to you by your health care provider. Make sure you discuss any questions you have with your health care provider. Document Revised: 03/09/2021 Document Reviewed: 03/09/2021 Elsevier Patient Education  2023 [employer]  

## 2022-01-18 NOTE — Progress Notes (Signed)
Subjective:  Patient ID: Diane Day, female    DOB: 26-Jan-1951  Age: 71 y.o. MRN: [id number]  CC: Hypertension   HPI Diane Day presents for f/up -  She is active and denies chest pain, shortness of breath, diaphoresis, dizziness, lightheadedness, or edema.  Outpatient Medications Prior to Visit  Medication Sig Dispense Refill   calcium carbonate (TUMS - DOSED IN MG ELEMENTAL CALCIUM) 500 MG chewable tablet Chew 1 tablet by mouth as needed.     Cholecalciferol (VITAMIN D3) 2000 UNITS TABS Take by mouth every morning.     Multiple Vitamin (MULTIVITAMIN) tablet Take 1 tablet by mouth daily.     psyllium (METAMUCIL) 58.6 % powder Take 1 packet by mouth daily.     raloxifene (EVISTA) 60 MG tablet Take 1 tablet (60 mg total) by mouth daily. 90 tablet 3   indapamide (LOZOL) 1.25 MG tablet Take 1 tablet (1.25 mg total) by mouth daily. 30 tablet 0   telmisartan (MICARDIS) 40 MG tablet Take 1 tablet (40 mg total) by mouth daily. 90 tablet 1   No facility-administered medications prior to visit.    ROS Review of Systems  Constitutional:  Negative for diaphoresis and fatigue.  HENT: Negative.    Eyes:  Negative for visual disturbance.  Respiratory:  Negative for cough, chest tightness and shortness of breath.   Cardiovascular:  Negative for chest pain, palpitations and leg swelling.  Gastrointestinal:  Negative for abdominal pain, constipation, diarrhea, nausea and vomiting.  Endocrine: Negative.   Genitourinary: Negative.  Negative for difficulty urinating.  Musculoskeletal: Negative.  Negative for arthralgias and myalgias.  Skin: Negative.   Allergic/Immunologic: Negative.   Neurological: Negative.  Negative for dizziness, weakness and light-headedness.  Hematological:  Negative for adenopathy. Does not bruise/bleed easily.    Objective:  BP 134/86 (BP Location: Left Arm, Patient Position: Sitting, Cuff Size: Normal)   Pulse 75   Temp 98.2 F (36.8 C) (Oral)    Resp 16   Wt 130 lb (59 kg)   LMP 05/16/2006   SpO2 96%   BMI 20.98 kg/m   BP Readings from Last 3 Encounters:  01/18/22 134/86  05/18/21 110/76  05/15/21 121/65    Wt Readings from Last 3 Encounters:  01/18/22 130 lb (59 kg)  05/18/21 124 lb (56.2 kg)  05/14/21 127 lb (57.6 kg)    Physical Exam Vitals reviewed.  HENT:     Nose: Nose normal.     Mouth/Throat:     Mouth: Mucous membranes are moist.  Eyes:     General: No scleral icterus.    Conjunctiva/sclera: Conjunctivae normal.  Cardiovascular:     Rate and Rhythm: Normal rate and regular rhythm.     Heart sounds: No murmur heard. Pulmonary:     Effort: Pulmonary effort is normal.     Breath sounds: No stridor. No wheezing, rhonchi or rales.  Abdominal:     General: Abdomen is flat.     Palpations: There is no mass.     Tenderness: There is no abdominal tenderness. There is no guarding.     Hernia: No hernia is present.  Musculoskeletal:        General: Normal range of motion.     Cervical back: Neck supple.     Right lower leg: No edema.     Left lower leg: No edema.  Lymphadenopathy:     Cervical: No cervical adenopathy.  Skin:    General: Skin is warm and dry.  Findings: No rash.  Neurological:     General: No focal deficit present.     Mental Status: She is alert. Mental status is at baseline.  Psychiatric:        Mood and Affect: Mood normal.        Behavior: Behavior normal.     Lab Results  Component Value Date   WBC 6.6 01/18/2022   HGB 12.8 01/18/2022   HCT 38.2 01/18/2022   PLT 236.0 01/18/2022   GLUCOSE 89 01/18/2022   CHOL 185 04/26/2021   TRIG 43.0 04/26/2021   HDL 85.00 04/26/2021   LDLCALC 91 04/26/2021   ALT 27 05/14/2021   AST 27 05/14/2021   NA 143 01/18/2022   K 4.1 01/18/2022   CL 106 01/18/2022   CREATININE 0.73 01/18/2022   BUN 12 01/18/2022   CO2 27 01/18/2022   TSH 0.81 04/26/2021   INR 1.1 (H) 04/23/2019   HGBA1C 5.6 11/20/2014    MR BREAST BILATERAL W  WO CONTRAST INC CAD  Result Date: 06/03/2021 CLINICAL DATA:  71 year old female with strong family history of breast cancer in mother and multiple sisters. EXAM: BILATERAL BREAST MRI WITH AND WITHOUT CONTRAST TECHNIQUE: Multiplanar, multisequence MR images of both breasts were obtained prior to and following the intravenous administration of 5 ml of Gadavist. Three-dimensional MR images were rendered by post-processing of the original MR data on an independent workstation. The three-dimensional MR images were interpreted, and findings are reported in the following complete MRI report for this study. Three dimensional images were evaluated at the independent interpreting workstation using the DynaCAD thin client. COMPARISON:  Previous exam(s). FINDINGS: Breast composition: b. Scattered fibroglandular tissue. Background parenchymal enhancement: Minimal. Right breast: No mass or abnormal enhancement. Left breast: No mass or abnormal enhancement. Lymph nodes: No abnormal appearing lymph nodes. Ancillary findings:  None. IMPRESSION: No MRI evidence of malignancy in either breast. RECOMMENDATION: Routine annual screening with mammography and breast MRI. The patient is due for her next bilateral mammogram in October 2023. BI-RADS CATEGORY  1: Negative. Electronically Signed   By: Kristopher Oppenheim M.D.   On: 06/03/2021 16:13   Assessment & Plan:   Diane Day was seen today for hypertension.  Diagnoses and all orders for this visit:  Essential hypertension- Her blood pressure is well controlled.  Labs are normal.  Will continue the combination of an ARB and thiazide diuretic. -     Basic metabolic panel; Future -     CBC with Differential/Platelet; Future -     Cancel: Urinalysis, Routine w reflex microscopic; Future -     CBC with Differential/Platelet -     Basic metabolic panel   I am having Diane Day "Diane Day" maintain her calcium carbonate, Vitamin D3, multivitamin, psyllium, raloxifene,  telmisartan, and indapamide.  Meds ordered this encounter  Medications   telmisartan (MICARDIS) 40 MG tablet    Sig: Take 1 tablet (40 mg total) by mouth daily.    Dispense:  90 tablet    Refill:  1   indapamide (LOZOL) 1.25 MG tablet    Sig: Take 1 tablet (1.25 mg total) by mouth daily.    Dispense:  90 tablet    Refill:  1     Follow-up: Return in about 6 months (around 07/19/2022).  Scarlette Calico, MD

## 2022-01-21 MED ORDER — TELMISARTAN 40 MG PO TABS: 40.0000 mg | ORAL_TABLET | Freq: Every day | ORAL | 1 refills | Status: DC

## 2022-01-21 MED ORDER — INDAPAMIDE 1.25 MG PO TABS: 1.2500 mg | ORAL_TABLET | Freq: Every day | ORAL | 1 refills | Status: DC

## 2022-01-29 ENCOUNTER — Encounter: Payer: Self-pay | Admitting: Internal Medicine

## 2022-03-07 ENCOUNTER — Telehealth: Payer: Medicare HMO | Admitting: Family Medicine

## 2022-03-07 DIAGNOSIS — J0140 Acute pansinusitis, unspecified: Secondary | ICD-10-CM

## 2022-03-07 MED ORDER — DOXYCYCLINE HYCLATE 100 MG PO TABS: 100.0000 mg | ORAL_TABLET | Freq: Two times a day (BID) | ORAL | 0 refills | Status: AC

## 2022-03-07 NOTE — Progress Notes (Signed)

## 2022-04-04 DIAGNOSIS — Z1231 Encounter for screening mammogram for malignant neoplasm of breast: Secondary | ICD-10-CM | POA: Diagnosis not present

## 2022-04-11 ENCOUNTER — Encounter: Payer: Self-pay | Admitting: Obstetrics and Gynecology

## 2022-04-29 ENCOUNTER — Telehealth: Payer: Medicare HMO | Admitting: Emergency Medicine

## 2022-04-29 DIAGNOSIS — H6690 Otitis media, unspecified, unspecified ear: Secondary | ICD-10-CM

## 2022-04-29 NOTE — Progress Notes (Signed)
E-Visit for Ear Pain - Acute Otitis Media   We are sorry that you are not feeling well. Here is how we plan to help!  Based on what you have shared with me it looks like you have Acute Otitis Media.  Acute Otitis Media is an infection of the middle or "inner" ear. This type of infection can cause redness, inflammation, and fluid buildup behind the tympanic membrane (ear drum).  The usual symptoms include: Earache/Pain Fever Upper respiratory symptoms Lack of energy/Fatigue/Malaise Slight hearing loss gradually worsening- if the inner ear fills with fluid What causes middle ear infections? Most middle ear infections occur when an infection such as a cold, leads to a build-up of mucus in the middle ear and causes the Eustachian tube (a thin tube that runs from the middle ear to the back of the nose) to become swollen or blocked.   This means mucus can't drain away properly, making it easier for an infection to spread into the middle ear.  How middle ear infections are treated: Most ear infections clear up within three to five days and don't need any specific treatment. If necessary, tylenol or ibuprofen should be used to relieve pain and a high temperature.  If you develop a fever higher than 102, or any significantly worsening symptoms, this could indicate a more serious infection moving to the middle/inner and needs face to face evaluation in an office by a provider.   Antibiotics aren't routinely used to treat middle ear infections, although they may occasionally be prescribed if symptoms persist or are particularly severe.  Given your presentation, I do not think antibiotics are needed at this time.   I recommend using Mucinex (or generic guaifenesin) and saline nasal spray or irrigation several times a day.   Try using saline irrigation, such as with a neti pot, several times a day while you are sick. Many neti pots come with salt packets premeasured to use to make saline. If you use  your own salt, make sure it is kosher salt or sea salt (don't use table salt as it has iodine in it and you don't need that in your nose). Use distilled water to make saline. If you mix your own saline using your own salt, the recipe is 1/4 teaspoon salt in 1 cup warm water. Using saline irrigation can help prevent and treat sinus infections.  Anything you can do to relieve your congestion will help relieve the pressure and pain in your ear.     Your symptoms should improve over the next 3 days and should resolve in about 7 days.  HOME CARE: Wash your hands frequently. If you are prescribed an ear drop, do not place the tip of the bottle on your ear or touch it with your fingers. You can take Acetaminophen 650 mg every 4-6 hours as needed for pain.  If pain is severe or moderate, you can apply a heating pad (set on low) or hot water bottle (wrapped in a towel) to outer ear for 20 minutes.  This will also increase drainage.  GET HELP RIGHT AWAY IF: Fever is over 102.2 degrees. You develop progressive ear pain or hearing loss. Ear symptoms persist longer than 3 days after treatment.  MAKE SURE YOU: Understand these instructions. Will watch your condition. Will get help right away if you are not doing well or get worse.  Thank you for choosing an e-visit.  Your e-visit answers were reviewed by a board certified advanced clinical practitioner to  complete your personal care plan. Depending upon the condition, your plan could have included both over the counter or prescription medications.  Please review your pharmacy choice. Make sure the pharmacy is open so you can pick up the prescription now. If there is a problem, you may contact your provider through [employer] and have the prescription routed to another pharmacy.  Your safety is important to [place]. If you have drug allergies check your prescription carefully.   For the next 24 hours you can use MyChart to ask questions about today's  visit, request a non-urgent call back, or ask for a work or school excuse. You will get an email with a survey after your eVisit asking about your experience. We would appreciate your feedback. I hope that your e-visit has been valuable and will aid in your recovery.  I have spent 5 minutes in review of e-visit questionnaire, review and updating patient chart, medical decision making and response to patient.   Willeen Cass, PhD, FNP-BC

## 2022-05-06 NOTE — Progress Notes (Deleted)
71 y.o. G44P3003 Married Caucasian female here for 1 yr f/u.    PCP:     [name] was 05/16/2006.           Sexually active: {yes no:314532}  The current method of family planning is tubal ligation/PMP Exercising: {yes no:314532}  {types:19826} Smoker:  {YES NO:22349}  Health Maintenance: Pap:  04-24-20 Neg, 02/24/16 Neg:Neg HR HPV,   11/30/12 ASCUS:Neg HR HPV  History of abnormal Pap:  no MMG:  04/04/22 Breast Composition Category B, BI-RADS CATEGORY 1 Negative Colonoscopy:  08/29/12 Mild diverticulosis. Repeat 10 years  BMD:   05/26/20  Result  Osteoporosis left hip, osteopenia right hip and spine.  TDaP:  2014 Gardasil:   no HIV: 03/22/16 Neg Hep C: 03/22/16 neg Screening Labs:  Hb today: ***, Urine today: ***   reports that she quit smoking about 47 years ago. Her smoking use included cigarettes. She has a 2.50 pack-year smoking history. She has never used smokeless tobacco. [name] She reports that she does not use drugs.  Past Medical History:  Diagnosis Date   Cancer ([place])    basal cell, face   Family history of breast cancer    Hypertension    Osteopenia    Osteoporosis    Rectal bleeding     Past Surgical History:  Procedure Laterality Date   BREAST SURGERY Left 1998   remove fibroadenoma   KNEE ARTHROSCOPY Left    repair broken arm  2008   TUBAL LIGATION  1994    Current Outpatient Medications  Medication Sig Dispense Refill   calcium carbonate (TUMS - DOSED IN MG ELEMENTAL CALCIUM) 500 MG chewable tablet Chew 1 tablet by mouth as needed.     Cholecalciferol (VITAMIN D3) 2000 UNITS TABS Take by mouth every morning.     indapamide (LOZOL) 1.25 MG tablet Take 1 tablet (1.25 mg total) by mouth daily. 90 tablet 1   Multiple Vitamin (MULTIVITAMIN) tablet Take 1 tablet by mouth daily.     psyllium (METAMUCIL) 58.6 % powder Take 1 packet by mouth daily.     raloxifene  (EVISTA) 60 MG tablet Take 1 tablet (60 mg total) by mouth daily. 90 tablet 3   telmisartan (MICARDIS) 40 MG tablet Take 1 tablet (40 mg total) by mouth daily. 90 tablet 1   No current facility-administered medications for this visit.    Family History  Problem Relation Age of Onset   Diabetes Father    Heart disease Father    Hypertension Father    Esophageal cancer Father 10   Breast cancer Mother 57   Hypertension Mother    Osteoporosis Mother    Breast cancer Sister 33       BRCA negative   Diabetes Sister    Breast cancer Sister 32   Cancer Paternal 57        unknown form of cancer over the age of 29   Thyroid cancer Cousin        maternal first cousin   Breast cancer Cousin        died in her 69s; paternal cousin   Breast cancer Cousin        paternal first cousin   Breast cancer Sister    Osteoporosis Brother    Colon cancer Neg Hx    Stomach cancer Neg Hx    Rectal cancer Neg Hx     Review of Systems  Exam:   LMP 05/16/2006     General appearance: alert, cooperative and appears stated age Head: normocephalic, without obvious abnormality, atraumatic Neck: no adenopathy, supple, symmetrical, trachea midline and thyroid normal to inspection and palpation Lungs: clear to auscultation bilaterally Breasts: normal appearance, no masses or tenderness, No nipple retraction or dimpling, No nipple discharge or bleeding, No axillary adenopathy Heart: regular rate and rhythm Abdomen: soft, non-tender; no masses, no organomegaly Extremities: extremities normal, atraumatic, no cyanosis or edema Skin: skin color, texture, turgor normal. No rashes or lesions Lymph nodes: cervical, supraclavicular, and axillary nodes normal. Neurologic: grossly normal  Pelvic: External genitalia:  no lesions              No abnormal inguinal nodes palpated.              Urethra:  normal appearing urethra with no masses, tenderness or lesions              Bartholins and Skenes: normal                  Vagina: normal appearing vagina with normal color and discharge, no lesions              Cervix: no lesions              Pap taken: {yes no:314532} Bimanual Exam:  Uterus:  normal size, contour, position, consistency, mobility, non-tender              Adnexa: no mass, fullness, tenderness              Rectal exam: {yes no:314532}.  Confirms.              Anus:  normal sphincter tone, no lesions  Chaperone was present for exam:  ***  Assessment:   Well woman visit with gynecologic exam.   Plan: Mammogram screening discussed. Self breast awareness reviewed. Pap and HR HPV as above. Guidelines for Calcium, Vitamin D, regular exercise program including cardiovascular and weight bearing exercise.   Follow up annually and prn.   Additional counseling given.  {yes [id number]. _______ minutes face to face time of which over 50% was spent in counseling.    After visit summary provided.

## 2022-05-14 ENCOUNTER — Encounter: Payer: Self-pay | Admitting: Internal Medicine

## 2022-05-14 ENCOUNTER — Telehealth: Payer: Medicare HMO | Admitting: Nurse Practitioner

## 2022-05-14 DIAGNOSIS — U071 COVID-19: Secondary | ICD-10-CM | POA: Diagnosis not present

## 2022-05-14 MED ORDER — NIRMATRELVIR/RITONAVIR (PAXLOVID)TABLET: 3.0000 | ORAL_TABLET | Freq: Two times a day (BID) | ORAL | 0 refills | Status: AC

## 2022-05-14 MED ORDER — ONDANSETRON 4 MG PO TBDP: 4.0000 mg | ORAL_TABLET | Freq: Three times a day (TID) | ORAL | 0 refills | Status: DC | PRN

## 2022-05-14 MED ORDER — PROMETHAZINE-DM 6.25-15 MG/5ML PO SYRP: 5.0000 mL | ORAL_SOLUTION | Freq: Four times a day (QID) | ORAL | 0 refills | Status: DC | PRN

## 2022-05-14 NOTE — Patient Instructions (Signed)
Diane Day, thank you for joining Gildardo Pounds, NP for today's virtual visit.  While this provider is not your primary care provider (PCP), if your PCP is located in our provider database this encounter information will be shared with them immediately following your visit.   [facility] account gives you access to today's visit and all your visits, tests, and labs performed at [facility] " click here if you don't have a [facility] account or go to [email or web address]  Consent: (Patient) Diane Day provided verbal consent for this virtual visit at the beginning of the encounter.  Current Medications:  Current Outpatient Medications:    nirmatrelvir/ritonavir (PAXLOVID) 20 x 150 MG & 10 x '100MG'$  TABS, Take 3 tablets by mouth 2 (two) times daily for 5 days. (Take nirmatrelvir 150 mg two tablets twice daily for 5 days and ritonavir 100 mg one tablet twice daily for 5 days) Patient GFR is 84.30, Disp: 30 tablet, Rfl: 0   ondansetron (ZOFRAN-ODT) 4 MG disintegrating tablet, Take 1 tablet (4 mg total) by mouth every 8 (eight) hours as needed for nausea or vomiting., Disp: 30 tablet, Rfl: 0   promethazine-dextromethorphan (PROMETHAZINE-DM) 6.25-15 MG/5ML syrup, Take 5 mLs by mouth 4 (four) times daily as needed for cough., Disp: 240 mL, Rfl: 0   calcium carbonate (TUMS - DOSED IN MG ELEMENTAL CALCIUM) 500 MG chewable tablet, Chew 1 tablet by mouth as needed., Disp: , Rfl:    Cholecalciferol (VITAMIN D3) 2000 UNITS TABS, Take by mouth every morning., Disp: , Rfl:    indapamide (LOZOL) 1.25 MG tablet, Take 1 tablet (1.25 mg total) by mouth daily., Disp: 90 tablet, Rfl: 1   Multiple Vitamin (MULTIVITAMIN) tablet, Take 1 tablet by mouth daily., Disp: , Rfl:    psyllium (METAMUCIL) 58.6 % powder, Take 1 packet by mouth daily., Disp: , Rfl:    raloxifene (EVISTA) 60 MG tablet, Take 1 tablet (60 mg total) by mouth daily., Disp: 90 tablet, Rfl: 3    telmisartan (MICARDIS) 40 MG tablet, Take 1 tablet (40 mg total) by mouth daily., Disp: 90 tablet, Rfl: 1   Medications ordered in this encounter:  Meds ordered this encounter  Medications   nirmatrelvir/ritonavir (PAXLOVID) 20 x 150 MG & 10 x '100MG'$  TABS    Sig: Take 3 tablets by mouth 2 (two) times daily for 5 days. (Take nirmatrelvir 150 mg two tablets twice daily for 5 days and ritonavir 100 mg one tablet twice daily for 5 days) Patient GFR is 84.30    Dispense:  30 tablet    Refill:  0    Order Specific Question:   Supervising Provider    Answer:   Chase Picket [[id number]]   promethazine-dextromethorphan (PROMETHAZINE-DM) 6.25-15 MG/5ML syrup    Sig: Take 5 mLs by mouth 4 (four) times daily as needed for cough.    Dispense:  240 mL    Refill:  0    Order Specific Question:   Supervising Provider    Answer:   LAMPTEY, PHILIP O [[id number]]   ondansetron (ZOFRAN-ODT) 4 MG disintegrating tablet    Sig: Take 1 tablet (4 mg total) by mouth every 8 (eight) hours as needed for nausea or vomiting.    Dispense:  30 tablet    Refill:  0    Order Specific Question:   Supervising Provider    Answer:   Chase Picket [id number]     *If you need refills on other medications prior  to your next appointment, please contact your pharmacy*  Follow-Up: Call back or seek an in-person evaluation if the symptoms worsen or if the condition fails to improve as anticipated.  [facility] [phone]  Other Instructions  Please keep well-hydrated and get plenty of rest. Start a saline nasal rinse to flush out your nasal passages. You can use plain Mucinex to help thin congestion. If you have a humidifier, you can use this daily as needed.    You are to wear a mask for 5 days from onset of your symptoms.  After day 5, if you have had no fever and you are feeling better with NO symptoms, you can end masking. Keep in mind you can be contagious 10 days from the onset of symptoms  After  day 5 if you have a fever or are having significant symptoms, please wear your mask for full 10 days.   If you note any worsening of symptoms, any significant shortness of breath or any chest pain, please seek ER evaluation ASAP.  Please do not delay care!    If you note any worsening of symptoms, any significant shortness of breath or any chest pain, please seek ER evaluation ASAP.  Please do not delay care!    If you have been instructed to have an in-person evaluation today at a local Urgent Care facility, please use the link below. It will take you to a list of all of our available [facility], including address, phone number and hours of operation. Please do not delay care.  [facility]  If you or a family member do not have a primary care provider, use the link below to schedule a visit and establish care. When you choose a [facility] primary care physician or advanced practice provider, you gain a long-term partner in health. Find a Primary Care Provider  Learn more about [facility]'s in-office and virtual care options: [facility]

## 2022-05-14 NOTE — Progress Notes (Signed)
Virtual Visit Consent   [name], you are scheduled for a virtual visit with a [facility] provider today. Just as with appointments in the office, your consent must be obtained to participate. Your consent will be active for this visit and any virtual visit you may have with one of our providers in the next 365 days. If you have a MyChart account, a copy of this consent can be sent to you electronically.  As this is a virtual visit, video technology does not allow for your provider to perform a traditional examination. This may limit your provider's ability to fully assess your condition. If your provider identifies any concerns that need to be evaluated in person or the need to arrange testing (such as labs, EKG, etc.), we will make arrangements to do so. Although advances in technology are sophisticated, we cannot ensure that it will always work on either your end or our end. If the connection with a video visit is poor, the visit may have to be switched to a telephone visit. With either a video or telephone visit, we are not always able to ensure that we have a secure connection.  By engaging in this virtual visit, you consent to the provision of healthcare and authorize for your insurance to be billed (if applicable) for the services provided during this visit. Depending on your insurance coverage, you may receive a charge related to this service.  I need to obtain your verbal consent now. Are you willing to proceed with your visit today? Diane Day has provided verbal consent on 05/14/2022 for a virtual visit (video or telephone). Diane Pounds, NP  Date: 05/14/2022 4:55 PM  Virtual Visit via Video Note   I, Diane Day, connected with  Diane Day  ([id number], 06/15/1950) on 05/14/22 at  4:45 PM EST by a video-enabled telemedicine application and verified that I am speaking with the correct person using two identifiers.  Location: Patient: Virtual Visit  Location Patient: Home Provider: Virtual Visit Location Provider: Home Office   I discussed the limitations of evaluation and management by telemedicine and the availability of in person appointments. The patient expressed understanding and agreed to proceed.    History of Present Illness: Diane Day is a 71 y.o. who identifies as a female who was assigned female at birth, and is being seen today for COVID positive..  Tested positive for covid over the past 24 hours.  Current symptoms include vomiting, headache, nasal congestion, fever with Tmax 99.5 and persistent cough.  She denies chest pain, sore throat or shortness of breath.  Problems:  Patient Active Problem List   Diagnosis Date Noted   Encounter for general adult medical examination with abnormal findings 05/01/2020   Achilles tendonitis, bilateral 04/23/2019   Osteoporosis without current pathological fracture 03/23/2016   Family history of breast cancer 01/01/2014   Snoring 09/19/2013   Pure hypercholesterolemia 12/02/2011   Essential hypertension 09/19/2008    Allergies: No Known Allergies Medications:  Current Outpatient Medications:    nirmatrelvir/ritonavir (PAXLOVID) 20 x 150 MG & 10 x '100MG'$  TABS, Take 3 tablets by mouth 2 (two) times daily for 5 days. (Take nirmatrelvir 150 mg two tablets twice daily for 5 days and ritonavir 100 mg one tablet twice daily for 5 days) Patient GFR is 84.30, Disp: 30 tablet, Rfl: 0   ondansetron (ZOFRAN-ODT) 4 MG disintegrating tablet, Take 1 tablet (4 mg total) by mouth every 8 (eight) hours as needed for nausea or  vomiting., Disp: 30 tablet, Rfl: 0   promethazine-dextromethorphan (PROMETHAZINE-DM) 6.25-15 MG/5ML syrup, Take 5 mLs by mouth 4 (four) times daily as needed for cough., Disp: 240 mL, Rfl: 0   calcium carbonate (TUMS - DOSED IN MG ELEMENTAL CALCIUM) 500 MG chewable tablet, Chew 1 tablet by mouth as needed., Disp: , Rfl:    Cholecalciferol (VITAMIN D3) 2000 UNITS TABS,  Take by mouth every morning., Disp: , Rfl:    indapamide (LOZOL) 1.25 MG tablet, Take 1 tablet (1.25 mg total) by mouth daily., Disp: 90 tablet, Rfl: 1   Multiple Vitamin (MULTIVITAMIN) tablet, Take 1 tablet by mouth daily., Disp: , Rfl:    psyllium (METAMUCIL) 58.6 % powder, Take 1 packet by mouth daily., Disp: , Rfl:    raloxifene (EVISTA) 60 MG tablet, Take 1 tablet (60 mg total) by mouth daily., Disp: 90 tablet, Rfl: 3   telmisartan (MICARDIS) 40 MG tablet, Take 1 tablet (40 mg total) by mouth daily., Disp: 90 tablet, Rfl: 1  Observations/Objective: Patient is well-developed, well-nourished in no acute distress.  Resting comfortably at home.  Head is normocephalic, atraumatic.  No labored breathing.  Speech is clear and coherent with logical content.  Patient is alert and oriented at baseline.    Assessment and Plan: 1. Lab test positive for detection of COVID-19 virus  2. Positive self-administered antigen test for COVID-19 - nirmatrelvir/ritonavir (PAXLOVID) 20 x 150 MG & 10 x '100MG'$  TABS; Take 3 tablets by mouth 2 (two) times daily for 5 days. (Take nirmatrelvir 150 mg two tablets twice daily for 5 days and ritonavir 100 mg one tablet twice daily for 5 days) Patient GFR is 84.30  Dispense: 30 tablet; Refill: 0 - promethazine-dextromethorphan (PROMETHAZINE-DM) 6.25-15 MG/5ML syrup; Take 5 mLs by mouth 4 (four) times daily as needed for cough.  Dispense: 240 mL; Refill: 0 - ondansetron (ZOFRAN-ODT) 4 MG disintegrating tablet; Take 1 tablet (4 mg total) by mouth every 8 (eight) hours as needed for nausea or vomiting.  Dispense: 30 tablet; Refill: 0   Please keep well-hydrated and get plenty of rest. Start a saline nasal rinse to flush out your nasal passages. You can use plain Mucinex to help thin congestion. If you have a humidifier, you can use this daily as needed.    You are to wear a mask for 5 days from onset of your symptoms.  After day 5, if you have had no fever and you are  feeling better with NO symptoms, you can end masking. Keep in mind you can be contagious 10 days from the onset of symptoms  After day 5 if you have a fever or are having significant symptoms, please wear your mask for full 10 days.   If you note any worsening of symptoms, any significant shortness of breath or any chest pain, please seek ER evaluation ASAP.  Please do not delay care!    If you note any worsening of symptoms, any significant shortness of breath or any chest pain, please seek ER evaluation ASAP.  Please do not delay care!   Follow Up Instructions: I discussed the assessment and treatment plan with the patient. The patient was provided an opportunity to ask questions and all were answered. The patient agreed with the plan and demonstrated an understanding of the instructions.  A copy of instructions were sent to the patient via MyChart unless otherwise noted below.     The patient was advised to call back or seek an in-person evaluation if the symptoms  worsen or if the condition fails to improve as anticipated.  Time:  I spent 12 minutes with the patient via telehealth technology discussing the above problems/concerns.    Diane Pounds, NP

## 2022-05-18 ENCOUNTER — Ambulatory Visit: Payer: Medicare HMO

## 2022-05-19 ENCOUNTER — Ambulatory Visit: Payer: Medicare HMO | Admitting: Obstetrics and Gynecology

## 2022-05-19 ENCOUNTER — Encounter: Payer: Medicare HMO | Admitting: Internal Medicine

## 2022-05-20 ENCOUNTER — Other Ambulatory Visit: Payer: Self-pay | Admitting: Obstetrics and Gynecology

## 2022-05-23 ENCOUNTER — Encounter: Payer: Medicare HMO | Admitting: Internal Medicine

## 2022-05-23 NOTE — Progress Notes (Signed)
72 y.o. [id number] Married Caucasian female here for 1 year f/u.    She is followed for osteoporosis and increased lifetime risk of breast cancer. Has FH of breast cancer.   She is on Evista.  No problems.   She has had genetic counseling but not testing. She is inquiring about consultation with a specialist for breast cancer prevention.   Patient just recovering from Covid.   PCP:   Scarlette Calico, MD  [name] was 05/16/2006.           Sexually active: Yes.    [name]    Exercising: Yes.     Swimming and walking Smoker:  former  Health Maintenance: Pap:  04-24-20 Neg, 02/24/16 Neg:Neg HR HPV,   11/30/12 ASCUS:Neg HR HPV  History of abnormal Pap:  no MMG:  04/04/22 Breast Density Category B, BI-RADS CATEGORY 1 Neg Breast MRI 06/03/21 - BI-RADS1.  Colonoscopy:  08/30/12 BMD:   05/26/20  Result  osteoporosis TDaP:  11/30/12 Gardasil:   no Screening Labs:  PCP   reports that she quit smoking about 47 years ago. Her smoking use included cigarettes. She has a 2.50 pack-year smoking history. She has never used smokeless tobacco. [name] She reports that she does not use drugs.  Past Medical History:  Diagnosis Date   Cancer ([place])    basal cell, face   Family history of breast cancer    Hypertension    Osteopenia    Osteoporosis    Rectal bleeding     Past Surgical History:  Procedure Laterality Date   BREAST SURGERY Left 1998   remove fibroadenoma   KNEE ARTHROSCOPY Left    repair broken arm  2008   TUBAL LIGATION  1994    Current Outpatient Medications  Medication Sig Dispense Refill   calcium carbonate (TUMS - DOSED IN MG ELEMENTAL CALCIUM) 500 MG chewable tablet Chew 1 tablet by mouth as needed.     Cholecalciferol (VITAMIN D3) 2000 UNITS TABS Take by mouth every morning.     indapamide (LOZOL) 1.25 MG tablet Take 1  tablet (1.25 mg total) by mouth daily. 90 tablet 1   Multiple Vitamin (MULTIVITAMIN) tablet Take 1 tablet by mouth daily.     psyllium (METAMUCIL) 58.6 % powder Take 1 packet by mouth daily.     raloxifene (EVISTA) 60 MG tablet Take 1 tablet (60 mg total) by mouth daily. 90 tablet 0   telmisartan (MICARDIS) 40 MG tablet Take 1 tablet (40 mg total) by mouth daily. 90 tablet 1   No current facility-administered medications for this visit.    Family History  Problem Relation Age of Onset   Diabetes Father    Heart disease Father    Hypertension Father    Esophageal cancer Father 19   Breast cancer Mother 60   Hypertension Mother    Osteoporosis Mother    Breast cancer Sister 50       BRCA negative   Diabetes Sister    Breast cancer Sister 29   Cancer Paternal Aunt        unknown form of cancer over the age of 71   Thyroid cancer Cousin        maternal first cousin   Breast cancer Cousin        died in her 65s; paternal cousin   Breast cancer Cousin  paternal first cousin   Breast cancer Sister    Osteoporosis Brother    Colon cancer Neg Hx    Stomach cancer Neg Hx    Rectal cancer Neg Hx     Review of Systems  All other systems reviewed and are negative.   Exam:   BP 124/84 (BP Location: Left Arm, Patient Position: Sitting, Cuff Size: Normal)   Pulse 91   Ht '5\' 6"'$  (1.676 m)   Wt 125 lb (56.7 kg)   LMP 05/16/2006   SpO2 95%   BMI 20.18 kg/m     General appearance: alert, cooperative and appears stated age Head: normocephalic, without obvious abnormality, atraumatic Neck: no adenopathy, supple, symmetrical, trachea midline and thyroid normal to inspection and palpation Lungs: clear to auscultation bilaterally Breasts: normal appearance, no masses or tenderness, No nipple retraction or dimpling, No nipple discharge or bleeding, No axillary adenopathy Heart: regular rate and rhythm Abdomen: soft, non-tender; no masses, no organomegaly Extremities: extremities  normal, atraumatic, no cyanosis or edema Skin: skin color, texture, turgor normal. No rashes or lesions Lymph nodes: cervical, supraclavicular, and axillary nodes normal. Neurologic: grossly normal  Pelvic: External genitalia:  no lesions              No abnormal inguinal nodes palpated.              Urethra:  normal appearing urethra with no masses, tenderness or lesions              Bartholins and Skenes: normal                 Vagina: normal appearing vagina with normal color and discharge, no lesions              Cervix: no lesions              Pap taken: No. Bimanual Exam:  Uterus:  normal size, contour, position, consistency, mobility, non-tender              Adnexa: no mass, fullness, tenderness              Rectal exam: Yes.  .  Confirms.              Anus:  normal sphincter tone, no lesions  Chaperone was present for exam:  Emily  Assessment:   Strong FH breast cancer.  Increased risk of breast cancer.  31 - 37%.    Has metal rod in her right arm.  Osteoporosis.  Stable. On Evista since end of 2018.  HTN.  On 2 medications.   Plan: Mammogram screening discussed. Breast MRI in May, 2024.  Self breast awareness reviewed.  Pap and HR HPV 2025.   Guidelines for Calcium, Vitamin D, regular exercise program including cardiovascular and weight bearing exercise.  Referral to Dr. Lindi Adie, high risk breast cancer screening clinic.  Continue Evista 60 mg daily.  We discussed risk of stroke with this medicaiton.   BMD at [facility].  She will call to schedule and I will sign the faxed order when I receive it.   Follow up annually and prn.   After visit summary provided.   28 min  total time was spent for this patient encounter, including preparation, face-to-face counseling with the patient, coordination of care, and documentation of the encounter.

## 2022-06-06 ENCOUNTER — Ambulatory Visit (INDEPENDENT_AMBULATORY_CARE_PROVIDER_SITE_OTHER): Payer: Medicare HMO | Admitting: Obstetrics and Gynecology

## 2022-06-06 ENCOUNTER — Telehealth: Payer: Self-pay | Admitting: Obstetrics and Gynecology

## 2022-06-06 ENCOUNTER — Encounter: Payer: Self-pay | Admitting: Obstetrics and Gynecology

## 2022-06-06 VITALS — BP 124/84 | HR 91 | Ht 66.0 in | Wt 125.0 lb

## 2022-06-06 DIAGNOSIS — M810 Age-related osteoporosis without current pathological fracture: Secondary | ICD-10-CM | POA: Diagnosis not present

## 2022-06-06 DIAGNOSIS — Z5181 Encounter for therapeutic drug level monitoring: Secondary | ICD-10-CM

## 2022-06-06 DIAGNOSIS — Z9189 Other specified personal risk factors, not elsewhere classified: Secondary | ICD-10-CM

## 2022-06-06 MED ORDER — RALOXIFENE HCL 60 MG PO TABS: 60.0000 mg | ORAL_TABLET | Freq: Every day | ORAL | 3 refills | Status: DC

## 2022-06-06 NOTE — Telephone Encounter (Signed)
Please make a referral to Dr. Lindi Adie at the [facility] for my patient.   She has increased lifetime risk of breast cancer, which is 31 - 37%.

## 2022-06-06 NOTE — Telephone Encounter (Signed)
Referral placed In EPIC.   Routing to [employer].   Encounter closed.

## 2022-06-07 ENCOUNTER — Ambulatory Visit (INDEPENDENT_AMBULATORY_CARE_PROVIDER_SITE_OTHER): Payer: Medicare HMO

## 2022-06-07 VITALS — Ht 66.0 in | Wt 125.0 lb

## 2022-06-07 DIAGNOSIS — Z0000 Encounter for general adult medical examination without abnormal findings: Secondary | ICD-10-CM | POA: Diagnosis not present

## 2022-06-07 NOTE — Patient Instructions (Addendum)
Diane Day , Thank you for taking time to come for your Medicare Wellness Visit. I appreciate your ongoing commitment to your health goals. Please review the following plan we discussed and let me know if I can assist you in the future.   These are the goals we discussed:  Goals       Patient stated (pt-stated)      Maintain all of my current goals.        This is a list of the screening recommended for you and due dates:  Health Maintenance  Topic Date Due   COVID-19 Vaccine (6 - 2023-24 season) 06/23/2022*   Zoster (Shingles) Vaccine (1 of 2) 01/19/2023*   Colon Cancer Screening  08/31/2022   DTaP/Tdap/Td vaccine (3 - Td or Tdap) 12/01/2022   Medicare Annual Wellness Visit  06/08/2023   Mammogram  04/04/2024   Pneumonia Vaccine  Completed   Flu Shot  Completed   DEXA scan (bone density measurement)  Completed   Hepatitis C Screening: USPSTF Recommendation to screen - Ages 12-79 yo.  Completed   HPV Vaccine  Aged Out  *Topic was postponed. The date shown is not the original due date.    Advanced directives: Advance directive discussed with you today. Even though you declined this today, please call our office should you change your mind, and we can give you the proper paperwork for you to fill out.   Conditions/risks identified: None  Next appointment: Follow up in one year for your annual wellness visit     Preventive Care 65 Years and Older, Female Preventive care refers to lifestyle choices and visits with your health care provider that can promote health and wellness. What does preventive care include? A yearly physical exam. This is also called an annual well check. Dental exams once or twice a year. Routine eye exams. Ask your health care provider how often you should have your eyes checked. Personal lifestyle choices, including: Daily care of your teeth and gums. Regular physical activity. Eating a healthy diet. Avoiding tobacco and drug use. Limiting alcohol  use. Practicing safe sex. Taking low-dose aspirin every day. Taking vitamin and mineral supplements as recommended by your health care provider. What happens during an annual well check? The services and screenings done by your health care provider during your annual well check will depend on your age, overall health, lifestyle risk factors, and family history of disease. Counseling  Your health care provider may ask you questions about your: Alcohol use. Tobacco use. Drug use. Emotional well-being. Home and relationship well-being. Sexual activity. Eating habits. History of falls. Memory and ability to understand (cognition). Work and work Statistician. Reproductive health. Screening  You may have the following tests or measurements: Height, weight, and BMI. Blood pressure. Lipid and cholesterol levels. These may be checked every 5 years, or more frequently if you are over 53 years old. Skin check. Lung cancer screening. You may have this screening every year starting at age 44 if you have a 30-pack-year history of smoking and currently smoke or have quit within the past 15 years. Fecal occult blood test (FOBT) of the stool. You may have this test every year starting at age 46. Flexible sigmoidoscopy or colonoscopy. You may have a sigmoidoscopy every 5 years or a colonoscopy every 10 years starting at age 18. Hepatitis C blood test. Hepatitis B blood test. Sexually transmitted disease (STD) testing. Diabetes screening. This is done by checking your blood sugar (glucose) after you have not eaten  for a while (fasting). You may have this done every 1-3 years. Bone density scan. This is done to screen for osteoporosis. You may have this done starting at age 71. Mammogram. This may be done every 1-2 years. Talk to your health care provider about how often you should have regular mammograms. Talk with your health care provider about your test results, treatment options, and if necessary,  the need for more tests. Vaccines  Your health care provider may recommend certain vaccines, such as: Influenza vaccine. This is recommended every year. Tetanus, diphtheria, and acellular pertussis (Tdap, Td) vaccine. You may need a Td booster every 10 years. Zoster vaccine. You may need this after age 18. Pneumococcal 13-valent conjugate (PCV13) vaccine. One dose is recommended after age 81. Pneumococcal polysaccharide (PPSV23) vaccine. One dose is recommended after age 69. Talk to your health care provider about which screenings and vaccines you need and how often you need them. This information is not intended to replace advice given to you by your health care provider. Make sure you discuss any questions you have with your health care provider. Document Released: 05/29/2015 Document Revised: 01/20/2016 Document Reviewed: 03/03/2015 Elsevier Interactive Patient Education  2017 [place] Prevention in the Home Falls can cause injuries. They can happen to people of all ages. There are many things you can do to make your home safe and to help prevent falls. What can I do on the outside of my home? Regularly fix the edges of walkways and driveways and fix any cracks. Remove anything that might make you trip as you walk through a door, such as a raised step or threshold. Trim any bushes or trees on the path to your home. Use bright outdoor lighting. Clear any walking paths of anything that might make someone trip, such as rocks or tools. Regularly check to see if handrails are loose or broken. Make sure that both sides of any steps have handrails. Any raised decks and porches should have guardrails on the edges. Have any leaves, snow, or ice cleared regularly. Use sand or salt on walking paths during winter. Clean up any spills in your garage right away. This includes oil or grease spills. What can I do in the bathroom? Use night lights. Install grab bars by the toilet and in the  tub and shower. Do not use towel bars as grab bars. Use non-skid mats or decals in the tub or shower. If you need to sit down in the shower, use a plastic, non-slip stool. Keep the floor dry. Clean up any water that spills on the floor as soon as it happens. Remove soap buildup in the tub or shower regularly. Attach bath mats securely with double-sided non-slip rug tape. Do not have throw rugs and other things on the floor that can make you trip. What can I do in the bedroom? Use night lights. Make sure that you have a light by your bed that is easy to reach. Do not use any sheets or blankets that are too big for your bed. They should not hang down onto the floor. Have a firm chair that has side arms. You can use this for support while you get dressed. Do not have throw rugs and other things on the floor that can make you trip. What can I do in the kitchen? Clean up any spills right away. Avoid walking on wet floors. Keep items that you use a lot in easy-to-reach places. If you need to reach something  above you, use a strong step stool that has a grab bar. Keep electrical cords out of the way. Do not use floor polish or wax that makes floors slippery. If you must use wax, use non-skid floor wax. Do not have throw rugs and other things on the floor that can make you trip. What can I do with my stairs? Do not leave any items on the stairs. Make sure that there are handrails on both sides of the stairs and use them. Fix handrails that are broken or loose. Make sure that handrails are as long as the stairways. Check any carpeting to make sure that it is firmly attached to the stairs. Fix any carpet that is loose or worn. Avoid having throw rugs at the top or bottom of the stairs. If you do have throw rugs, attach them to the floor with carpet tape. Make sure that you have a light switch at the top of the stairs and the bottom of the stairs. If you do not have them, ask someone to add them for  you. What else can I do to help prevent falls? Wear shoes that: Do not have high heels. Have rubber bottoms. Are comfortable and fit you well. Are closed at the toe. Do not wear sandals. If you use a stepladder: Make sure that it is fully opened. Do not climb a closed stepladder. Make sure that both sides of the stepladder are locked into place. Ask someone to hold it for you, if possible. Clearly mark and make sure that you can see: Any grab bars or handrails. First and last steps. Where the edge of each step is. Use tools that help you move around (mobility aids) if they are needed. These include: Canes. Walkers. Scooters. Crutches. Turn on the lights when you go into a dark area. Replace any light bulbs as soon as they burn out. Set up your furniture so you have a clear path. Avoid moving your furniture around. If any of your floors are uneven, fix them. If there are any pets around you, be aware of where they are. Review your medicines with your doctor. Some medicines can make you feel dizzy. This can increase your chance of falling. Ask your doctor what other things that you can do to help prevent falls. This information is not intended to replace advice given to you by your health care provider. Make sure you discuss any questions you have with your health care provider. Document Released: 02/26/2009 Document Revised: 10/08/2015 Document Reviewed: 06/06/2014 Elsevier Interactive Patient Education  2017 [employer].

## 2022-06-07 NOTE — Progress Notes (Signed)
Subjective:   Diane Day is a 72 y.o. female who presents for Medicare Annual (Subsequent) preventive examination.  Review of Systems    Virtual Visit via Telephone Note  I connected with  Diane Day on 06/07/22 at 10:00 AM EST by telephone and verified that I am speaking with the correct person using two identifiers.  Location: Patient: Home Provider: Office Persons participating in the virtual visit: patient/Nurse Health Advisor   I discussed the limitations, risks, security and privacy concerns of performing an evaluation and management service by telephone and the availability of in person appointments. The patient expressed understanding and agreed to proceed.  Interactive audio and video telecommunications were attempted between this nurse and patient, however failed, due to patient having technical difficulties OR patient did not have access to video capability.  We continued and completed visit with audio only.  Some vital signs may be absent or patient reported.   Criselda Peaches, LPN  Cardiac Risk Factors include: advanced age (>1mn, >>18women);hypertension     Objective:    Today's Vitals   06/07/22 1004  Weight: 125 lb (56.7 kg)  Height: '5\' 6"'$  (1.676 m)   Body mass index is 20.18 kg/m.     06/07/2022   10:09 AM 06/16/2021    8:06 AM 05/14/2021   10:55 PM  Advanced Directives  Does Patient Have a Medical Advance Directive? No No No  Would patient like information on creating a medical advance directive? No - Patient declined No - Patient declined     Current Medications (verified) Outpatient Encounter Medications as of 06/07/2022  Medication Sig   calcium carbonate (TUMS - DOSED IN MG ELEMENTAL CALCIUM) 500 MG chewable tablet Chew 1 tablet by mouth as needed.   Cholecalciferol (VITAMIN D3) 2000 UNITS TABS Take by mouth every morning.   indapamide (LOZOL) 1.25 MG tablet Take 1 tablet (1.25 mg total) by mouth daily.   Multiple Vitamin  (MULTIVITAMIN) tablet Take 1 tablet by mouth daily.   psyllium (METAMUCIL) 58.6 % powder Take 1 packet by mouth daily.   raloxifene (EVISTA) 60 MG tablet Take 1 tablet (60 mg total) by mouth daily.   telmisartan (MICARDIS) 40 MG tablet Take 1 tablet (40 mg total) by mouth daily.   No facility-administered encounter medications on file as of 06/07/2022.    Allergies (verified) Patient has no known allergies.   History: Past Medical History:  Diagnosis Date   Cancer ([place]    basal cell, face   Family history of breast cancer    Hypertension    Osteopenia    Osteoporosis    Rectal bleeding    Past Surgical History:  Procedure Laterality Date   BREAST SURGERY Left 1998   remove fibroadenoma   KNEE ARTHROSCOPY Left    repair broken arm  2008   TUBAL LIGATION  1994   Family History  Problem Relation Age of Onset   Diabetes Father    Heart disease Father    Hypertension Father    Esophageal cancer Father 862  Breast cancer Mother 864  Hypertension Mother    Osteoporosis Mother    Breast cancer Sister [phone]      BRCA negative   Diabetes Sister    Breast cancer Sister 514  Cancer Paternal Aunt        unknown form of cancer over the age of 574  Thyroid cancer Cousin        maternal first cousin  Breast cancer Cousin        died in her 21s; paternal cousin   Breast cancer Cousin        paternal first cousin   Breast cancer Sister    Osteoporosis Brother    Colon cancer Neg Hx    Stomach cancer Neg Hx    Rectal cancer Neg Hx    Social History   Socioeconomic History   Marital status: Married    Spouse name: Not on file   Number of children: 3   Years of education: Not on file   Highest education level: Not on file  Occupational History    Employer: [employer]  Tobacco Use   Smoking status: Former    Packs/day: 0.50    Years: 5.00    Total pack years: 2.50    Types: Cigarettes    Quit date: 12/02/1974    Years since quitting: 47.5   Smokeless tobacco:  Never  Vaping Use   Vaping Use: Never used  Substance and Sexual Activity   Alcohol use: Yes    Alcohol/week: 5.0 standard drinks of alcohol    Types: 5 Cans of beer per week    Comment: occ glass of wine, drink of choice beer   Drug use: No   Sexual activity: Yes    Partners: Male    Birth control/protection: Post-menopausal  Other Topics Concern   Not on file  Social History Narrative   Daily caffeine use   Social Determinants of Health   Financial Resource Strain: Low Risk  (06/07/2022)   Overall Financial Resource Strain (CARDIA)    Difficulty of Paying Living Expenses: Not hard at all  Food Insecurity: No Food Insecurity (06/07/2022)   Hunger Vital Sign    Worried About Running Out of Food in the Last Year: Never true    Ran Out of Food in the Last Year: Never true  Transportation Needs: No Transportation Needs (06/07/2022)   PRAPARE - Hydrologist (Medical): No    Lack of Transportation (Non-Medical): No  Physical Activity: Sufficiently Active (06/07/2022)   Exercise Vital Sign    Days of Exercise per Week: 7 days    Minutes of Exercise per Session: 30 min  Stress: No Stress Concern Present (06/07/2022)   [place]    Feeling of Stress : Not at all  Social Connections: [place] (06/07/2022)   Social Connection and Isolation Panel [NHANES]    Frequency of Communication with Friends and Family: More than three times a week    Frequency of Social Gatherings with Friends and Family: More than three times a week    Attends Religious Services: More than 4 times per year    Active Member of [employer] or Organizations: Yes    Attends Music therapist: More than 4 times per year    Marital Status: Married    Tobacco Counseling Counseling given: Not Answered   Clinical Intake:  Pre-visit preparation completed: No  Pain : No/denies pain     BMI - recorded:  20.18 Nutritional Status: BMI of 19-24  Normal Nutritional Risks: None Diabetes: No  How often do you need to have someone help you when you read instructions, pamphlets, or other written materials from your doctor or pharmacy?: 1 - Never  Diabetic?  No     Information entered by :: Rolene Arbour LPN   Activities of Daily Living    06/07/2022  10:09 AM  In your present state of health, do you have any difficulty performing the following activities:  Hearing? 0  Vision? 0  Difficulty concentrating or making decisions? 0  Walking or climbing stairs? 0  Dressing or bathing? 0  Doing errands, shopping? 0  Preparing Food and eating ? N  Using the Toilet? N  In the past six months, have you accidently leaked urine? N  Do you have problems with loss of bowel control? N  Managing your Medications? N  Managing your Finances? N  Housekeeping or managing your Housekeeping? N    Patient Care Team: Janith Lima, MD as PCP - General (Internal Medicine)  Indicate any recent Medical Services you may have received from other than [facility] providers in the past year (date may be approximate).     Assessment:   This is a routine wellness examination for [place].  Hearing/Vision screen Hearing Screening - Comments:: Denies hearing difficulties   Vision Screening - Comments:: Wears rx glasses - up to date with routine eye exams with  Dr Valetta Close  Dietary issues and exercise activities discussed: Exercise limited by: None identified   Goals Addressed               This Visit's Progress     Patient stated (pt-stated)        Maintain all of my current goals.       Depression Screen    06/07/2022   10:08 AM 04/30/2020    8:02 AM 04/23/2019    8:38 AM 04/09/2018    3:08 PM 03/29/2017    2:14 PM 09/19/2013   11:59 AM  PHQ 2/9 Scores  PHQ - 2 Score 0 0 0 0 0 0    Fall Risk    06/07/2022   10:09 AM 06/06/2022    3:53 PM 04/30/2020    8:02 AM 04/23/2019    8:38 AM  03/29/2017    2:14 PM  Fall Risk   Falls in the past year? 0 0 0 0 No  Number falls in past yr: 0 0  0   Injury with Fall? 0 0  0   Risk for fall due to : No Fall Risks      Follow up Falls prevention discussed   Falls evaluation completed     FALL RISK PREVENTION PERTAINING TO THE HOME:  Any stairs in or around the home? Yes  If so, are there any without handrails? No  Home free of loose throw rugs in walkways, pet beds, electrical cords, etc? Yes  Adequate lighting in your home to reduce risk of falls? Yes   ASSISTIVE DEVICES UTILIZED TO PREVENT FALLS:  Life alert? No  Use of a cane, walker or w/c? No  Grab bars in the bathroom? No  Shower chair or bench in shower? No  Elevated toilet seat or a handicapped toilet? No   TIMED UP AND GO:  Was the test performed? No . Audio Visit    Cognitive Function:        06/07/2022   10:11 AM  6CIT Screen  What Year? 0 points  What month? 0 points  What time? 0 points  Count back from 20 0 points  Months in reverse 0 points  Repeat phrase 0 points  Total Score 0 points    Immunizations Immunization History  Administered Date(s) Administered   Fluad Quad(high Dose 65+) 02/07/2019   Influenza,inj,Quad PF,6+ Mos 03/22/2016, 03/29/2017, 04/09/2018   Influenza-Unspecified 03/24/2020, 03/10/2021, 01/26/2022  PFIZER(Purple Top)SARS-COV-2 Vaccination 06/04/2019, 06/25/2019   Pfizer Covid-19 Vaccine Bivalent Booster 46yr & up 01/29/2022   Pneumococcal Conjugate-13 04/23/2019   Pneumococcal Polysaccharide-23 04/30/2020   Td 12/02/2002   Tdap 11/30/2012   Unspecified SARS-COV-2 Vaccination 02/24/2020, 01/18/2021    TDAP status: Up to date  Flu Vaccine status: Up to date  Pneumococcal vaccine status: Up to date  Covid-19 vaccine status: Completed vaccines  Qualifies for Shingles Vaccine? Yes   Zostavax completed No   Shingrix Completed?: No.    Education has been provided regarding the importance of this vaccine.  Patient has been advised to call insurance company to determine out of pocket expense if they have not yet received this vaccine. Advised may also receive vaccine at local pharmacy or Health Dept. Verbalized acceptance and understanding.  Screening Tests Health Maintenance  Topic Date Due   COVID-19 Vaccine (6 - 2023-24 season) 06/23/2022 (Originally 03/26/2022)   Zoster Vaccines- Shingrix (1 of 2) 01/19/2023 (Originally 04/18/1970)   COLONOSCOPY (Pts [phone] coverage will need to be confirmed)  08/31/2022   DTaP/Tdap/Td (3 - Td or Tdap) 12/01/2022   Medicare Annual Wellness (AWV)  06/08/2023   MAMMOGRAM  04/04/2024   Pneumonia Vaccine 6578Years old  Completed   INFLUENZA VACCINE  Completed   DEXA SCAN  Completed   Hepatitis C Screening  Completed   HPV VACCINES  Aged Out    Health Maintenance  There are no preventive care reminders to display for this patient.   Colorectal cancer screening: Type of screening: Colonoscopy. Completed 08/30/12. Repeat every 10 years  Mammogram status: Completed 04/04/22. Repeat every year  Bone Density status: Completed 05/26/20. Results reflect: Bone density results: OSTEOPOROSIS. Repeat every   years.  Lung Cancer Screening: (Low Dose CT Chest recommended if Age 70-80ears, 30 pack-year currently smoking OR have quit w/in 15years.) does not qualify.     Additional Screening:  Hepatitis C Screening: does qualify; Completed 04/01/16  Vision Screening: Recommended annual ophthalmology exams for early detection of glaucoma and other disorders of the eye. Is the patient up to date with their annual eye exam?  Yes  Who is the provider or what is the name of the office in which the patient attends annual eye exams? Dr BoValetta Closef pt is not established with a provider, would they like to be referred to a provider to establish care? No .   Dental Screening: Recommended annual dental exams for proper oral hygiene  Community Resource Referral /  Chronic Care Management:  CRR required this visit?  No   CCM required this visit?  No      Plan:     I have personally reviewed and noted the following in the patient's chart:   Medical and social history Use of alcohol, tobacco or illicit drugs  Current medications and supplements including opioid prescriptions. Patient is not currently taking opioid prescriptions. Functional ability and status Nutritional status Physical activity Advanced directives List of other physicians Hospitalizations, surgeries, and ER visits in previous 12 months Vitals Screenings to include cognitive, depression, and falls Referrals and appointments  In addition, I have reviewed and discussed with patient certain preventive protocols, quality metrics, and best practice recommendations. A written personalized care plan for preventive services as well as general preventive health recommendations were provided to patient.     BeCriselda PeachesLPN   05/16/61/8466 Nurse Notes: None

## 2022-06-21 DIAGNOSIS — Z780 Asymptomatic menopausal state: Secondary | ICD-10-CM | POA: Diagnosis not present

## 2022-06-21 DIAGNOSIS — M8588 Other specified disorders of bone density and structure, other site: Secondary | ICD-10-CM | POA: Diagnosis not present

## 2022-06-21 DIAGNOSIS — M810 Age-related osteoporosis without current pathological fracture: Secondary | ICD-10-CM | POA: Diagnosis not present

## 2022-06-22 ENCOUNTER — Ambulatory Visit (INDEPENDENT_AMBULATORY_CARE_PROVIDER_SITE_OTHER): Payer: Medicare HMO | Admitting: Internal Medicine

## 2022-06-22 ENCOUNTER — Encounter: Payer: Self-pay | Admitting: Internal Medicine

## 2022-06-22 ENCOUNTER — Inpatient Hospital Stay: Payer: Medicare HMO | Attending: Hematology and Oncology | Admitting: Hematology and Oncology

## 2022-06-22 ENCOUNTER — Encounter: Payer: Self-pay | Admitting: Obstetrics and Gynecology

## 2022-06-22 VITALS — BP 132/78 | HR 61 | Temp 98.2°F | Resp 16 | Ht 66.0 in | Wt 124.0 lb

## 2022-06-22 VITALS — BP 122/81 | HR 71 | Temp 97.5°F | Resp 18 | Ht 66.0 in | Wt 124.5 lb

## 2022-06-22 DIAGNOSIS — Z0001 Encounter for general adult medical examination with abnormal findings: Secondary | ICD-10-CM

## 2022-06-22 DIAGNOSIS — I10 Essential (primary) hypertension: Secondary | ICD-10-CM

## 2022-06-22 DIAGNOSIS — Z79810 Long term (current) use of selective estrogen receptor modulators (SERMs): Secondary | ICD-10-CM | POA: Insufficient documentation

## 2022-06-22 DIAGNOSIS — M810 Age-related osteoporosis without current pathological fracture: Secondary | ICD-10-CM | POA: Insufficient documentation

## 2022-06-22 DIAGNOSIS — E7800 Pure hypercholesterolemia, unspecified: Secondary | ICD-10-CM

## 2022-06-22 DIAGNOSIS — Z85828 Personal history of other malignant neoplasm of skin: Secondary | ICD-10-CM | POA: Diagnosis not present

## 2022-06-22 DIAGNOSIS — Z9189 Other specified personal risk factors, not elsewhere classified: Secondary | ICD-10-CM | POA: Insufficient documentation

## 2022-06-22 DIAGNOSIS — Z803 Family history of malignant neoplasm of breast: Secondary | ICD-10-CM | POA: Diagnosis not present

## 2022-06-22 DIAGNOSIS — Z87891 Personal history of nicotine dependence: Secondary | ICD-10-CM | POA: Diagnosis not present

## 2022-06-22 LAB — HEPATIC FUNCTION PANEL
ALT: 16 U/L (ref 0–35)
AST: 19 U/L (ref 0–37)
Albumin: 4.8 g/dL (ref 3.5–5.2)
Alkaline Phosphatase: 63 U/L (ref 39–117)
Bilirubin, Direct: 0.1 mg/dL (ref 0.0–0.3)
Total Bilirubin: 0.5 mg/dL (ref 0.2–1.2)
Total Protein: 7 g/dL (ref 6.0–8.3)

## 2022-06-22 LAB — BASIC METABOLIC PANEL
BUN: 13 mg/dL (ref 6–23)
CO2: 28 mEq/L (ref 19–32)
Calcium: 9.5 mg/dL (ref 8.4–10.5)
Chloride: 106 mEq/L (ref 96–112)
Creatinine, Ser: 0.71 mg/dL (ref 0.40–1.20)
GFR: 86.9 mL/min (ref >60.00)
Glucose, Bld: 95 mg/dL (ref 70–99)
Potassium: 3.9 mEq/L (ref 3.5–5.1)
Sodium: 141 mEq/L (ref 135–145)

## 2022-06-22 LAB — CBC WITH DIFFERENTIAL/PLATELET
Basophils Absolute: 0.1 10*3/uL (ref 0.0–0.1)
Basophils Relative: 1.1 % (ref 0.0–3.0)
Eosinophils Absolute: 0.1 10*3/uL (ref 0.0–0.7)
Eosinophils Relative: 1 % (ref 0.0–5.0)
HCT: 40.1 % (ref 36.0–46.0)
Hemoglobin: 13.5 g/dL (ref 12.0–15.0)
Lymphocytes Relative: 45.9 % (ref 12.0–46.0)
Lymphs Abs: 3.1 10*3/uL (ref 0.7–4.0)
MCHC: 33.6 g/dL (ref 30.0–36.0)
MCV: 89.6 fl (ref 78.0–100.0)
Monocytes Absolute: 0.8 10*3/uL (ref 0.1–1.0)
Monocytes Relative: 11.6 % (ref 3.0–12.0)
Neutro Abs: 2.7 10*3/uL (ref 1.4–7.7)
Neutrophils Relative %: 40.4 % — ABNORMAL LOW (ref 43.0–77.0)
Platelets: 296 10*3/uL (ref 150.0–400.0)
RBC: 4.47 Mil/uL (ref 3.87–5.11)
RDW: 13.7 % (ref 11.5–15.5)
WBC: 6.8 10*3/uL (ref 4.0–10.5)

## 2022-06-22 LAB — TSH: TSH: 0.6 u[IU]/mL (ref 0.35–5.50)

## 2022-06-22 LAB — VITAMIN D 25 HYDROXY (VIT D DEFICIENCY, FRACTURES): VITD: 56.39 ng/mL (ref 30.00–100.00)

## 2022-06-22 NOTE — Assessment & Plan Note (Signed)
1.  Risk assessment: A.  Gail model: 5-year risk B.  Tyrer-Cuzick model: 10-year risk C.  Tyrer-Cuzick model: Lifetime risk  2. Risk reduction: A.  Pharmacological risk reduction: Tamoxifen versus raloxifene B.  Nonpharmacological risk reduction: Stress importance of eating healthy, diet, exercise, supplements like vitamin D and turmeric, avoiding alcohol  3.  Breast cancer surveillance: A.  Mammograms annually B.  Breast MRIs annually versus every 2 to 3 years  Return to clinic in 1 year for follow-up  

## 2022-06-22 NOTE — Progress Notes (Unsigned)
Subjective:  Patient ID: Diane Day, female    DOB: 20-May-1950  Age: 72 y.o. MRN: [id number]  CC: Annual Exam and Hypertension   HPI Diane Day presents for a CPX and f/up -   She is active and denies chest pain, shortness of breath, diaphoresis, edema, dizziness, or lightheadedness.  Outpatient Medications Prior to Visit  Medication Sig Dispense Refill   calcium carbonate (TUMS - DOSED IN MG ELEMENTAL CALCIUM) 500 MG chewable tablet Chew 1 tablet by mouth as needed.     Cholecalciferol (VITAMIN D3) 2000 UNITS TABS Take by mouth every morning.     indapamide (LOZOL) 1.25 MG tablet Take 1 tablet (1.25 mg total) by mouth daily. 90 tablet 1   Multiple Vitamin (MULTIVITAMIN) tablet Take 1 tablet by mouth daily.     psyllium (METAMUCIL) 58.6 % powder Take 1 packet by mouth daily.     raloxifene (EVISTA) 60 MG tablet Take 1 tablet (60 mg total) by mouth daily. 90 tablet 3   telmisartan (MICARDIS) 40 MG tablet Take 1 tablet (40 mg total) by mouth daily. 90 tablet 1   No facility-administered medications prior to visit.    ROS Review of Systems  Constitutional:  Negative for diaphoresis, fatigue and fever.  HENT: Negative.    Eyes: Negative.   Respiratory:  Negative for cough, chest tightness, shortness of breath and wheezing.   Cardiovascular:  Negative for chest pain, palpitations and leg swelling.  Gastrointestinal:  Negative for abdominal pain, constipation, diarrhea, nausea and vomiting.  Endocrine: Negative.   Genitourinary: Negative.  Negative for difficulty urinating.  Musculoskeletal: Negative.  Negative for arthralgias and myalgias.  Skin: Negative.   Neurological:  Negative for dizziness, weakness and headaches.  Hematological:  Negative for adenopathy. Does not bruise/bleed easily.  Psychiatric/Behavioral: Negative.      Objective:  BP 132/78 (BP Location: Left Arm, Patient Position: Sitting, Cuff Size: Normal)   Pulse 61   Temp 98.2 F (36.8 C)  (Oral)   Resp 16   Ht '5\' 6"'$  (1.676 m)   Wt 124 lb (56.2 kg)   LMP 05/16/2006   SpO2 99%   BMI 20.01 kg/m   BP Readings from Last 3 Encounters:  06/22/22 132/78  06/22/22 122/81  06/06/22 124/84    Wt Readings from Last 3 Encounters:  06/22/22 124 lb (56.2 kg)  06/22/22 124 lb 8 oz (56.5 kg)  06/07/22 125 lb (56.7 kg)    Physical Exam Vitals reviewed.  Constitutional:      Appearance: Normal appearance.  HENT:     Nose: Nose normal.     Mouth/Throat:     Mouth: Mucous membranes are moist.  Eyes:     General: No scleral icterus.    Conjunctiva/sclera: Conjunctivae normal.  Cardiovascular:     Rate and Rhythm: Normal rate and regular rhythm.     Heart sounds: No murmur heard. Pulmonary:     Effort: Pulmonary effort is normal.     Breath sounds: No stridor. No wheezing, rhonchi or rales.  Abdominal:     General: Abdomen is flat.     Palpations: There is no mass.     Tenderness: There is no abdominal tenderness. There is no guarding.     Hernia: No hernia is present.  Musculoskeletal:        General: Normal range of motion.     Cervical back: Neck supple.     Right lower leg: No edema.     Left lower leg:  No edema.  Lymphadenopathy:     Cervical: No cervical adenopathy.  Skin:    General: Skin is warm and dry.     Coloration: Skin is not pale.  Neurological:     General: No focal deficit present.     Mental Status: She is alert. Mental status is at baseline.  Psychiatric:        Mood and Affect: Mood normal.        Behavior: Behavior normal.     Lab Results  Component Value Date   WBC 6.8 06/22/2022   HGB 13.5 06/22/2022   HCT 40.1 06/22/2022   PLT 296.0 06/22/2022   GLUCOSE 95 06/22/2022   CHOL 185 04/26/2021   TRIG 43.0 04/26/2021   HDL 85.00 04/26/2021   LDLCALC 91 04/26/2021   ALT 16 06/22/2022   AST 19 06/22/2022   NA 141 06/22/2022   K 3.9 06/22/2022   CL 106 06/22/2022   CREATININE 0.71 06/22/2022   BUN 13 06/22/2022   CO2 28  06/22/2022   TSH 0.60 06/22/2022   INR 1.1 (H) 04/23/2019   HGBA1C 5.6 11/20/2014    MR BREAST BILATERAL W WO CONTRAST INC CAD  Result Date: 06/03/2021 CLINICAL DATA:  72 year old female with strong family history of breast cancer in mother and multiple sisters. EXAM: BILATERAL BREAST MRI WITH AND WITHOUT CONTRAST TECHNIQUE: Multiplanar, multisequence MR images of both breasts were obtained prior to and following the intravenous administration of 5 ml of Gadavist. Three-dimensional MR images were rendered by post-processing of the original MR data on an independent workstation. The three-dimensional MR images were interpreted, and findings are reported in the following complete MRI report for this study. Three dimensional images were evaluated at the independent interpreting workstation using the DynaCAD thin client. COMPARISON:  Previous exam(s). FINDINGS: Breast composition: b. Scattered fibroglandular tissue. Background parenchymal enhancement: Minimal. Right breast: No mass or abnormal enhancement. Left breast: No mass or abnormal enhancement. Lymph nodes: No abnormal appearing lymph nodes. Ancillary findings:  None. IMPRESSION: No MRI evidence of malignancy in either breast. RECOMMENDATION: Routine annual screening with mammography and breast MRI. The patient is due for her next bilateral mammogram in October 2023. BI-RADS CATEGORY  1: Negative. Electronically Signed   By: Kristopher Oppenheim M.D.   On: 06/03/2021 16:13   Assessment & Plan:   Diane Day was seen today for annual exam and hypertension.  Diagnoses and all orders for this visit:  Age-related osteoporosis without current pathological fracture- I recommended that she start treating this with an anabolic agent. -     VITAMIN D 25 Hydroxy (Vit-D Deficiency, Fractures); Future -     Basic metabolic panel; Future -     Basic metabolic panel -     VITAMIN D 25 Hydroxy (Vit-D Deficiency, Fractures)  Essential hypertension- Her blood  pressure is well-controlled. -     TSH; Future -     CBC with Differential/Platelet; Future -     Basic metabolic panel; Future -     Hepatic function panel; Future -     Hepatic function panel -     Basic metabolic panel -     CBC with Differential/Platelet -     TSH  Pure hypercholesterolemia- She has a low ASCVD risk score.  Statin is not indicated. -     TSH; Future -     Hepatic function panel; Future -     Hepatic function panel -     TSH  Encounter for general  adult medical examination with abnormal findings- Exam completed, labs reviewed, vaccines reviewed and updated, cancer screenings are up-to-date, patient education was given.   I am having Diane Day "Diane Day" maintain her calcium carbonate, Vitamin D3, multivitamin, psyllium, telmisartan, indapamide, and raloxifene.  No orders of the defined types were placed in this encounter.    Follow-up: Return in about 6 months (around 12/21/2022).  Scarlette Calico, MD

## 2022-06-22 NOTE — Patient Instructions (Signed)
EVENITY or PROLIA?   Health Maintenance, Female Adopting a healthy lifestyle and getting preventive care are important in promoting health and wellness. Ask your health care provider about: The right schedule for you to have regular tests and exams. Things you can do on your own to prevent diseases and keep yourself healthy. What should I know about diet, weight, and exercise? Eat a healthy diet  Eat a diet that includes plenty of vegetables, fruits, low-fat dairy products, and lean protein. Do not eat a lot of foods that are high in solid fats, added sugars, or sodium. Maintain a healthy weight Body mass index (BMI) is used to identify weight problems. It estimates body fat based on height and weight. Your health care provider can help determine your BMI and help you achieve or maintain a healthy weight. Get regular exercise Get regular exercise. This is one of the most important things you can do for your health. Most adults should: Exercise for at least 150 minutes each week. The exercise should increase your heart rate and make you sweat (moderate-intensity exercise). Do strengthening exercises at least twice a week. This is in addition to the moderate-intensity exercise. Spend less time sitting. Even light physical activity can be beneficial. Watch cholesterol and blood lipids Have your blood tested for lipids and cholesterol at 70 years of age, then have this test every 5 years. Have your cholesterol levels checked more often if: Your lipid or cholesterol levels are high. You are older than 70 years of age. You are at high risk for heart disease. What should I know about cancer screening? Depending on your health history and family history, you may need to have cancer screening at various ages. This may include screening for: Breast cancer. Cervical cancer. Colorectal cancer. Skin cancer. Lung cancer. What should I know about heart disease, diabetes, and high blood  pressure? Blood pressure and heart disease High blood pressure causes heart disease and increases the risk of stroke. This is more likely to develop in people who have high blood pressure readings or are overweight. Have your blood pressure checked: Every 3-5 years if you are 70-106 years of age. Every year if you are 52 years old or older. Diabetes Have regular diabetes screenings. This checks your fasting blood sugar level. Have the screening done: Once every three years after age 70 if you are at a normal weight and have a low risk for diabetes. More often and at a younger age if you are overweight or have a high risk for diabetes. What should I know about preventing infection? Hepatitis B If you have a higher risk for hepatitis B, you should be screened for this virus. Talk with your health care provider to find out if you are at risk for hepatitis B infection. Hepatitis C Testing is recommended for: Everyone born from [birth year] through 1963. Anyone with known risk factors for hepatitis C. Sexually transmitted infections (STIs) Get screened for STIs, including gonorrhea and chlamydia, if: You are sexually active and are younger than 70 years of age. You are older than 70 years of age and your health care provider tells you that you are at risk for this type of infection. Your sexual activity has changed since you were last screened, and you are at increased risk for chlamydia or gonorrhea. Ask your health care provider if you are at risk. Ask your health care provider about whether you are at high risk for HIV. Your health care provider may recommend a prescription  medicine to help prevent HIV infection. If you choose to take medicine to prevent HIV, you should first get tested for HIV. You should then be tested every 3 months for as long as you are taking the medicine. Pregnancy If you are about to stop having your period (premenopausal) and you may become pregnant, seek counseling before you  get pregnant. Take 400 to 800 micrograms (mcg) of folic acid every day if you become pregnant. Ask for birth control (contraception) if you want to prevent pregnancy. Osteoporosis and menopause Osteoporosis is a disease in which the bones lose minerals and strength with aging. This can result in bone fractures. If you are 34 years old or older, or if you are at risk for osteoporosis and fractures, ask your health care provider if you should: Be screened for bone loss. Take a calcium or vitamin D supplement to lower your risk of fractures. Be given hormone replacement therapy (HRT) to treat symptoms of menopause. Follow these instructions at home: Alcohol use Do not drink alcohol if: Your health care provider tells you not to drink. You are pregnant, may be pregnant, or are planning to become pregnant. If you drink alcohol: Limit how much you have to: 0-1 drink a day. Know how much alcohol is in your drink. In the [place] one drink equals one 12 oz bottle of beer (355 mL), one 5 oz glass of wine (148 mL), or one 1 oz glass of hard liquor (44 mL). Lifestyle Do not use any products that contain nicotine or tobacco. These products include cigarettes, chewing tobacco, and vaping devices, such as e-cigarettes. If you need help quitting, ask your health care provider. Do not use street drugs. Do not share needles. Ask your health care provider for help if you need support or information about quitting drugs. General instructions Schedule regular health, dental, and eye exams. Stay current with your vaccines. Tell your health care provider if: You often feel depressed. You have ever been abused or do not feel safe at home. Summary Adopting a healthy lifestyle and getting preventive care are important in promoting health and wellness. Follow your health care provider's instructions about healthy diet, exercising, and getting tested or screened for diseases. Follow your health care provider's  instructions on monitoring your cholesterol and blood pressure. This information is not intended to replace advice given to you by your health care provider. Make sure you discuss any questions you have with your health care provider. Document Revised: 09/21/2020 Document Reviewed: 09/21/2020 Elsevier Patient Education  [place].

## 2022-06-22 NOTE — Progress Notes (Signed)
[place] NOTE  Patient Care Team: Janith Lima, MD as PCP - General (Internal Medicine)  CHIEF COMPLAINTS/PURPOSE OF CONSULTATION:  Newly diagnosed at high risk for breast cancer  HISTORY OF PRESENTING ILLNESS:  Diane Day 72 y.o. female is here because of recent diagnosis of being high risk for breast cancer.  She was referred to [place] for further evaluation.  She has been taking raloxifene for osteoporosis and breast cancer risk reduction for the past 9 to 10 years.  She has extensive family history of breast cancer including mother and 3 sisters.  Because of this she was referred to [place] for discussion regarding her risk of breast cancer.  Also to discuss surveillance and risk reduction measures.  I reviewed her records extensively and collaborated the history with the patient.    MEDICAL HISTORY:  Past Medical History:  Diagnosis Date   Cancer ([place])    basal cell, face   Family history of breast cancer    Hypertension    Osteopenia    Osteoporosis    Rectal bleeding     SURGICAL HISTORY: Past Surgical History:  Procedure Laterality Date   BREAST SURGERY Left 1998   remove fibroadenoma   KNEE ARTHROSCOPY Left    repair broken arm  2008   TUBAL LIGATION  1994    SOCIAL HISTORY: Social History   Socioeconomic History   Marital status: Married    Spouse name: Not on file   Number of children: 3   Years of education: Not on file   Highest education level: Not on file  Occupational History    Employer: [employer]  Tobacco Use   Smoking status: Former    Packs/day: 0.50    Years: 5.00    Total pack years: 2.50    Types: Cigarettes    Quit date: 12/02/1974    Years since quitting: 47.5   Smokeless tobacco: Never  Vaping Use   Vaping Use: Never used  Substance and Sexual Activity   Alcohol use: Yes    Alcohol/week: 5.0 standard drinks of alcohol    Types: 5 Cans of beer per week    Comment: occ glass of wine, drink of  choice beer   Drug use: No   Sexual activity: Yes    Partners: Male    Birth control/protection: Post-menopausal  Other Topics Concern   Not on file  Social History Narrative   Daily caffeine use   Social Determinants of Health   Financial Resource Strain: Low Risk  (06/07/2022)   Overall Financial Resource Strain (CARDIA)    Difficulty of Paying Living Expenses: Not hard at all  Food Insecurity: No Food Insecurity (06/07/2022)   Hunger Vital Sign    Worried About Running Out of Food in the Last Year: Never true    Ran Out of Food in the Last Year: Never true  Transportation Needs: No Transportation Needs (06/07/2022)   PRAPARE - Hydrologist (Medical): No    Lack of Transportation (Non-Medical): No  Physical Activity: Sufficiently Active (06/07/2022)   Exercise Vital Sign    Days of Exercise per Week: 7 days    Minutes of Exercise per Session: 30 min  Stress: No Stress Concern Present (06/07/2022)   [place]    Feeling of Stress : Not at all  Social Connections: [place] (06/07/2022)   Social Connection and Isolation Panel [NHANES]  Frequency of Communication with Friends and Family: More than three times a week    Frequency of Social Gatherings with Friends and Family: More than three times a week    Attends Religious Services: More than 4 times per year    Active Member of [employer] or Organizations: Yes    Attends Music therapist: More than 4 times per year    Marital Status: Married  Human resources officer Violence: Not At Risk (06/07/2022)   Humiliation, Afraid, Rape, and Kick questionnaire    Fear of Current or Ex-Partner: No    Emotionally Abused: No    Physically Abused: No    Sexually Abused: No    FAMILY HISTORY: Family History  Problem Relation Age of Onset   Diabetes Father    Heart disease Father    Hypertension Father    Esophageal cancer Father  22   Breast cancer Mother 75   Hypertension Mother    Osteoporosis Mother    Breast cancer Sister 72       BRCA negative   Diabetes Sister    Breast cancer Sister 45   Cancer Paternal Aunt        unknown form of cancer over the age of 43   Thyroid cancer Cousin        maternal first cousin   Breast cancer Cousin        died in her 37s; paternal cousin   Breast cancer Cousin        paternal first cousin   Breast cancer Sister    Osteoporosis Brother    Colon cancer Neg Hx    Stomach cancer Neg Hx    Rectal cancer Neg Hx     ALLERGIES:  has No Known Allergies.  MEDICATIONS:  Current Outpatient Medications  Medication Sig Dispense Refill   calcium carbonate (TUMS - DOSED IN MG ELEMENTAL CALCIUM) 500 MG chewable tablet Chew 1 tablet by mouth as needed.     Cholecalciferol (VITAMIN D3) 2000 UNITS TABS Take by mouth every morning.     indapamide (LOZOL) 1.25 MG tablet Take 1 tablet (1.25 mg total) by mouth daily. 90 tablet 1   Multiple Vitamin (MULTIVITAMIN) tablet Take 1 tablet by mouth daily.     psyllium (METAMUCIL) 58.6 % powder Take 1 packet by mouth daily.     raloxifene (EVISTA) 60 MG tablet Take 1 tablet (60 mg total) by mouth daily. 90 tablet 3   telmisartan (MICARDIS) 40 MG tablet Take 1 tablet (40 mg total) by mouth daily. 90 tablet 1   No current facility-administered medications for this visit.    REVIEW OF SYSTEMS:   Constitutional: Denies fevers, chills or abnormal night sweats Breast:  Denies any palpable lumps or discharge All other systems were reviewed with the patient and are negative.  PHYSICAL EXAMINATION: ECOG PERFORMANCE STATUS: 0 - Asymptomatic  Vitals:   06/22/22 1255  BP: 122/81  Pulse: 71  Resp: 18  Temp: (!) 97.5 F (36.4 C)  SpO2: 100%   Filed Weights   06/22/22 1255  Weight: 124 lb 8 oz (56.5 kg)    GENERAL:alert, no distress and comfortable     LABORATORY DATA:  I have reviewed the data as listed Lab Results  Component Value  Date   WBC 6.6 01/18/2022   HGB 12.8 01/18/2022   HCT 38.2 01/18/2022   MCV 91.3 01/18/2022   PLT 236.0 01/18/2022   Lab Results  Component Value Date   NA 143 01/18/2022  K 4.1 01/18/2022   CL 106 01/18/2022   CO2 27 01/18/2022    RADIOGRAPHIC STUDIES: I have personally reviewed the radiological reports and agreed with the findings in the report.  ASSESSMENT AND PLAN:  At high risk for breast cancer 1.  Risk assessment: ABaker Janus model: 5-year risk 6.9% (average 2.2%) B.  Tyrer-Cuzick model: 10-year risk 11% (average risk 3%) C.  Tyrer-Cuzick model: Lifetime risk: 16.2% (average risk 4.6%) Gail risk model estimated the risk of 20%  2. Risk reduction: A.  Pharmacological risk reduction: raloxifene: Patient has been on for the past 10 years.  I discussed with her about discontinuing and stopping the medication. B.  Nonpharmacological risk reduction: Stress importance of eating healthy, diet, exercise, supplements like vitamin D and turmeric, avoiding alcohol  3.  Breast cancer surveillance: A.  Mammograms annually B.  Breast MRIs annually versus every 2 to 3 years  Return to clinic on an as-needed basis   All questions were answered. The patient knows to call the clinic with any problems, questions or concerns.    Harriette Ohara, MD 06/22/22

## 2022-06-23 ENCOUNTER — Telehealth: Payer: Self-pay | Admitting: Pharmacy Technician

## 2022-06-23 ENCOUNTER — Encounter: Payer: Self-pay | Admitting: Obstetrics and Gynecology

## 2022-06-23 NOTE — Telephone Encounter (Signed)
Evenity VOB initiated via [email or web address]

## 2022-06-23 NOTE — Telephone Encounter (Signed)
-----   Message from Janith Lima, MD sent at 06/22/2022  5:01 PM EST ----- Regarding: evenity Will you see if she is a candidate for evenity?

## 2022-06-27 DIAGNOSIS — H2513 Age-related nuclear cataract, bilateral: Secondary | ICD-10-CM | POA: Diagnosis not present

## 2022-06-27 DIAGNOSIS — H524 Presbyopia: Secondary | ICD-10-CM | POA: Diagnosis not present

## 2022-06-27 DIAGNOSIS — H35371 Puckering of macula, right eye: Secondary | ICD-10-CM | POA: Diagnosis not present

## 2022-06-28 ENCOUNTER — Other Ambulatory Visit: Payer: Self-pay

## 2022-06-28 ENCOUNTER — Other Ambulatory Visit: Payer: Self-pay | Admitting: Internal Medicine

## 2022-06-28 ENCOUNTER — Other Ambulatory Visit (HOSPITAL_COMMUNITY): Payer: Self-pay

## 2022-06-28 DIAGNOSIS — M810 Age-related osteoporosis without current pathological fracture: Secondary | ICD-10-CM

## 2022-06-28 MED ORDER — DENOSUMAB 60 MG/ML ~~LOC~~ SOSY
60.0000 mg | PREFILLED_SYRINGE | Freq: Once | SUBCUTANEOUS | 1 refills | Status: AC
  Filled 2022-06-28: qty 1, 1d supply, fill #0
  Filled 2022-06-28 – 2022-07-25 (×2): qty 1, 180d supply, fill #0
  Filled 2022-07-25: qty 1, 90d supply, fill #0

## 2022-06-28 NOTE — Telephone Encounter (Signed)
Initiated PA for [employer] via [employer]. Patient is required to trial both Prolia and Zoledronic acid. Please advise if patient has a documented intolerable adverse event or contraindication to Prolia and Zoledronic acid or teriparatide.

## 2022-06-29 ENCOUNTER — Other Ambulatory Visit (HOSPITAL_COMMUNITY): Payer: Self-pay

## 2022-06-29 NOTE — Telephone Encounter (Signed)
Prolia VOB initiated via [email or web address] 

## 2022-06-30 ENCOUNTER — Other Ambulatory Visit (HOSPITAL_COMMUNITY): Payer: Self-pay

## 2022-07-01 ENCOUNTER — Other Ambulatory Visit (HOSPITAL_COMMUNITY): Payer: Self-pay

## 2022-07-01 NOTE — Telephone Encounter (Signed)
Patient Advocate Encounter  Prior Authorization for Prolia 66m has been approved.    PA# [id number] dates: 07/01/22 through 07/02/23

## 2022-07-01 NOTE — Telephone Encounter (Signed)
Prolia PA pending via [employer]  Authorization Number : [id number]

## 2022-07-01 NOTE — Telephone Encounter (Signed)
Pt ready for scheduling for Prolia on or after : 07/01/22  Out-of-pocket cost due at time of visit: $327  Primary: [employer]are Prolia co-insurance: 20% Admin fee co-insurance: 20%  Secondary: N/A Prolia co-insurance:  Admin fee co-insurance:   Medical Benefit Details: Date Benefits were checked: 06/29/22 Deductible: NO/ Coinsurance: 20%/ Admin Fee: 20%  Prior Auth: APPROVED PA# [id number] Expiration Date: 07/02/23   Pharmacy benefit: Copay $--- If patient wants fill through the pharmacy benefit please send prescription to: [employer], and include estimated need by date in rx notes. Pharmacy will ship medication directly to the office.  Patient NOT eligible for Prolia Copay Card. Copay Card can make patient's cost as little as $25. Link to apply: [email or web address]  ** This summary of benefits is an estimation of the patient's out-of-pocket cost. Exact cost may very based on individual plan coverage.

## 2022-07-11 NOTE — Progress Notes (Unsigned)
GYNECOLOGY  VISIT   HPI: 72 y.o.   Married  Caucasian  female   [phone] with [name] was 05/16/2006.   here for   discuss dexa.  Has been on Evista for breast cancer risk reduction.  Saw Dr. Lindi Adie 06/22/22.  Dr. Ronnald Ramp has arranged for Prolia for the patient.   GYNECOLOGIC HISTORY: [name] was 05/16/2006. Contraception:  PMP Menopausal hormone therapy:  n/a Last mammogram:   04/04/22 Breast Density Category B, BI-RADS CATEGORY 1 Neg Breast MRI 06/03/21 - BI-RADS1.  Last pap smear:   04-24-20 Neg, 02/24/16 Neg:Neg HR HPV,   11/30/12 ASCUS:Neg HR HPV          OB History     Gravida  3   Para  3   Term  3   Preterm      AB      Living  3      SAB      IAB      Ectopic      Multiple      Live Births                 Patient Active Problem List   Diagnosis Date Noted   At high risk for breast cancer 06/22/2022   Encounter for general adult medical examination with abnormal findings 05/01/2020   Achilles tendonitis, bilateral 04/23/2019   Osteoporosis without current pathological fracture 03/23/2016   Family history of breast cancer 01/01/2014   Snoring 09/19/2013   Pure hypercholesterolemia 12/02/2011   Essential hypertension 09/19/2008    Past Medical History:  Diagnosis Date   Cancer ([place])    basal cell, face   Family history of breast cancer    Hypertension    Osteopenia    Osteoporosis    Rectal bleeding     Past Surgical History:  Procedure Laterality Date   BREAST SURGERY Left 1998   remove fibroadenoma   KNEE ARTHROSCOPY Left    repair broken arm  2008   TUBAL LIGATION  1994    Current Outpatient Medications  Medication Sig Dispense Refill   calcium carbonate (TUMS - DOSED IN MG ELEMENTAL CALCIUM) 500 MG chewable tablet Chew 1 tablet by mouth as needed.     Cholecalciferol (VITAMIN D3) 2000 UNITS TABS Take by mouth every morning.     indapamide (LOZOL) 1.25 MG tablet Take 1 tablet (1.25  mg total) by mouth daily. 90 tablet 1   Multiple Vitamin (MULTIVITAMIN) tablet Take 1 tablet by mouth daily.     psyllium (METAMUCIL) 58.6 % powder Take 1 packet by mouth daily.     raloxifene (EVISTA) 60 MG tablet Take 1 tablet (60 mg total) by mouth daily. 90 tablet 3   telmisartan (MICARDIS) 40 MG tablet Take 1 tablet (40 mg total) by mouth daily. 90 tablet 1   No current facility-administered medications for this visit.     ALLERGIES: Patient has no known allergies.  Family History  Problem Relation Age of Onset   Diabetes Father    Heart disease Father    Hypertension Father    Esophageal cancer Father 88   Breast cancer Mother 18   Hypertension Mother    Osteoporosis Mother    Breast cancer Sister 36       BRCA negative   Diabetes Sister    Breast cancer Sister 26   Cancer Paternal Aunt        unknown form of cancer over the age  of 59   Thyroid cancer Cousin        maternal first cousin   Breast cancer Cousin        died in her 60s; paternal cousin   Breast cancer Cousin        paternal first cousin   Breast cancer Sister    Osteoporosis Brother    Colon cancer Neg Hx    Stomach cancer Neg Hx    Rectal cancer Neg Hx     Social History   Socioeconomic History   Marital status: Married    Spouse name: Not on file   Number of children: 3   Years of education: Not on file   Highest education level: Not on file  Occupational History    Employer: [employer]  Tobacco Use   Smoking status: Former    Packs/day: 0.50    Years: 5.00    Total pack years: 2.50    Types: Cigarettes    Quit date: 12/02/1974    Years since quitting: 47.6   Smokeless tobacco: Never  Vaping Use   Vaping Use: Never used  Substance and Sexual Activity   Alcohol use: Yes    Alcohol/week: 5.0 standard drinks of alcohol    Types: 5 Cans of beer per week    Comment: occ glass of wine, drink of choice beer   Drug use: No   Sexual activity: Yes    Partners: Male    Birth  control/protection: Post-menopausal  Other Topics Concern   Not on file  Social History Narrative   Daily caffeine use   Social Determinants of Health   Financial Resource Strain: Low Risk  (06/07/2022)   Overall Financial Resource Strain (CARDIA)    Difficulty of Paying Living Expenses: Not hard at all  Food Insecurity: No Food Insecurity (06/07/2022)   Hunger Vital Sign    Worried About Running Out of Food in the Last Year: Never true    Ran Out of Food in the Last Year: Never true  Transportation Needs: No Transportation Needs (06/07/2022)   PRAPARE - Hydrologist (Medical): No    Lack of Transportation (Non-Medical): No  Physical Activity: Sufficiently Active (06/07/2022)   Exercise Vital Sign    Days of Exercise per Week: 7 days    Minutes of Exercise per Session: 30 min  Stress: No Stress Concern Present (06/07/2022)   [place]    Feeling of Stress : Not at all  Social Connections: [place] (06/07/2022)   Social Connection and Isolation Panel [NHANES]    Frequency of Communication with Friends and Family: More than three times a week    Frequency of Social Gatherings with Friends and Family: More than three times a week    Attends Religious Services: More than 4 times per year    Active Member of [employer] or Organizations: Yes    Attends Archivist Meetings: More than 4 times per year    Marital Status: Married  Human resources officer Violence: Not At Risk (06/07/2022)   Humiliation, Afraid, Rape, and Kick questionnaire    Fear of Current or Ex-Partner: No    Emotionally Abused: No    Physically Abused: No    Sexually Abused: No    Review of Systems  All other systems reviewed and are negative.   PHYSICAL EXAMINATION:    LMP 05/16/2006     General appearance: alert, cooperative and appears stated  age Head: Normocephalic, without obvious abnormality,  atraumatic Neck: no adenopathy, supple, symmetrical, trachea midline and thyroid normal to inspection and palpation Lungs: clear to auscultation bilaterally Breasts: normal appearance, no masses or tenderness, No nipple retraction or dimpling, No nipple discharge or bleeding, No axillary or supraclavicular adenopathy Heart: regular rate and rhythm Abdomen: soft, non-tender, no masses,  no organomegaly Extremities: extremities normal, atraumatic, no cyanosis or edema Skin: Skin color, texture, turgor normal. No rashes or lesions Lymph nodes: Cervical, supraclavicular, and axillary nodes normal. No abnormal inguinal nodes palpated Neurologic: Grossly normal  Pelvic: External genitalia:  no lesions              Urethra:  normal appearing urethra with no masses, tenderness or lesions              Bartholins and Skenes: normal                 Vagina: normal appearing vagina with normal color and discharge, no lesions              Cervix: no lesions                Bimanual Exam:  Uterus:  normal size, contour, position, consistency, mobility, non-tender              Adnexa: no mass, fullness, tenderness              Rectal exam: {yes no:[id number]}.  Confirms.              Anus:  normal sphincter tone, no lesions  Chaperone was present for exam:  ***  ASSESSMENT     PLAN  Ok to stop Evista. Prolia.   An After Visit Summary was printed and given to the patient.  ______ minutes face to face time of which over 50% was spent in counseling.

## 2022-07-19 DIAGNOSIS — L578 Other skin changes due to chronic exposure to nonionizing radiation: Secondary | ICD-10-CM | POA: Diagnosis not present

## 2022-07-19 DIAGNOSIS — Z85828 Personal history of other malignant neoplasm of skin: Secondary | ICD-10-CM | POA: Diagnosis not present

## 2022-07-19 DIAGNOSIS — D2372 Other benign neoplasm of skin of left lower limb, including hip: Secondary | ICD-10-CM | POA: Diagnosis not present

## 2022-07-19 DIAGNOSIS — L821 Other seborrheic keratosis: Secondary | ICD-10-CM | POA: Diagnosis not present

## 2022-07-19 DIAGNOSIS — L570 Actinic keratosis: Secondary | ICD-10-CM | POA: Diagnosis not present

## 2022-07-19 DIAGNOSIS — D485 Neoplasm of uncertain behavior of skin: Secondary | ICD-10-CM | POA: Diagnosis not present

## 2022-07-25 ENCOUNTER — Other Ambulatory Visit: Payer: Self-pay

## 2022-07-25 ENCOUNTER — Ambulatory Visit (INDEPENDENT_AMBULATORY_CARE_PROVIDER_SITE_OTHER): Payer: Medicare HMO | Admitting: Obstetrics and Gynecology

## 2022-07-25 ENCOUNTER — Encounter: Payer: Self-pay | Admitting: Obstetrics and Gynecology

## 2022-07-25 ENCOUNTER — Other Ambulatory Visit (HOSPITAL_COMMUNITY): Payer: Self-pay

## 2022-07-25 VITALS — BP 128/82 | HR 88 | Ht 66.0 in | Wt 130.0 lb

## 2022-07-25 DIAGNOSIS — M810 Age-related osteoporosis without current pathological fracture: Secondary | ICD-10-CM

## 2022-07-25 NOTE — Patient Instructions (Signed)
Denosumab Injection (Osteoporosis) What is this medication? DENOSUMAB (den oh SUE mab) prevents and treats osteoporosis. It works by making your bones stronger and less likely to break (fracture). It is a monoclonal antibody. This medicine may be used for other purposes; ask your health care provider or pharmacist if you have questions. COMMON BRAND NAME(S): Prolia What should I tell my care team before I take this medication? They need to know if you have any of these conditions: Dental or gum disease, or plan to have dental surgery or a tooth pulled Infection Kidney disease Low levels of calcium or vitamin D in your blood On dialysis Poor nutrition Skin conditions Thyroid disease, or have had thyroid or parathyroid surgery Trouble absorbing minerals in your stomach or intestine An unusual or allergic reaction to denosumab, other medications, foods, dyes, or preservatives Pregnant or trying to get pregnant Breastfeeding How should I use this medication? This medication is injected under the skin. It is given by your care team in a hospital or clinic setting. A special MedGuide will be given to you before each treatment. Be sure to read this information carefully each time. Talk to your care team about the use of this medication in children. Special care may be needed. Overdosage: If you think you have taken too much of this medicine contact a poison control center or emergency room at once. NOTE: This medicine is only for you. Do not share this medicine with others. What if I miss a dose? Keep appointments for follow-up doses. It is important not to miss your dose. Call your care team if you are unable to keep an appointment. What may interact with this medication? Do not take this medication with any of the following: Other medications that contain denosumab This medication may also interact with the following: Medications that lower your chance of fighting infection Steroid  medications, such as prednisone or cortisone This list may not describe all possible interactions. Give your health care provider a list of all the medicines, herbs, non-prescription drugs, or dietary supplements you use. Also tell them if you smoke, drink alcohol, or use illegal drugs. Some items may interact with your medicine. What should I watch for while using this medication? Your condition will be monitored carefully while you are receiving this medication. You may need blood work while taking this medication. This medication may increase your risk of getting an infection. Call your care team for advice if you get a fever, chills, sore throat, or other symptoms of a cold or flu. Do not treat yourself. Try to avoid being around people who are sick. Tell your dentist and dental surgeon that you are taking this medication. You should not have major dental surgery while on this medication. See your dentist to have a dental exam and fix any dental problems before starting this medication. Take good care of your teeth while on this medication. Make sure you see your dentist for regular follow-up appointments. You should make sure you get enough calcium and vitamin D while you are taking this medication. Discuss the foods you eat and the vitamins you take with your care team. Talk to your care team if you are pregnant or think you might be pregnant. This medication can cause serious birth defects if taken during pregnancy and for 5 months after the last dose. You will need a negative pregnancy test before starting this medication. Contraception is recommended while taking this medication and for 5 months after the last dose. Your care   team can help you find the option that works for you. Talk to your care team before breastfeeding. Changes to your treatment plan may be needed. What side effects may I notice from receiving this medication? Side effects that you should report to your care team as soon as  possible: Allergic reactions--skin rash, itching, hives, swelling of the face, lips, tongue, or throat Infection--fever, chills, cough, sore throat, wounds that don't heal, pain or trouble when passing urine, general feeling of discomfort or being unwell Low calcium level--muscle pain or cramps, confusion, tingling, or numbness in the hands or feet Osteonecrosis of the jaw--pain, swelling, or redness in the mouth, numbness of the jaw, poor healing after dental work, unusual discharge from the mouth, visible bones in the mouth Severe bone, joint, or muscle pain Skin infection--skin redness, swelling, warmth, or pain Side effects that usually do not require medical attention (report these to your care team if they continue or are bothersome): Back pain Headache Joint pain Muscle pain Pain in the hands, arms, legs, or feet Runny or stuffy nose Sore throat This list may not describe all possible side effects. Call your doctor for medical advice about side effects. You may report side effects to FDA at 1-800-FDA-1088. Where should I keep my medication? This medication is given in a hospital or clinic. It will not be stored at home. NOTE: This sheet is a summary. It may not cover all possible information. If you have questions about this medicine, talk to your doctor, pharmacist, or health care provider.  2023 Elsevier/Gold Standard (2021-09-13 00:00:00)  

## 2022-07-27 ENCOUNTER — Encounter: Payer: Self-pay | Admitting: Internal Medicine

## 2022-07-29 ENCOUNTER — Encounter: Payer: Medicare HMO | Admitting: Internal Medicine

## 2022-07-29 ENCOUNTER — Ambulatory Visit: Payer: Medicare HMO

## 2022-07-29 DIAGNOSIS — M810 Age-related osteoporosis without current pathological fracture: Secondary | ICD-10-CM | POA: Diagnosis not present

## 2022-07-29 MED ORDER — DENOSUMAB 60 MG/ML ~~LOC~~ SOSY: 60.0000 mg | PREFILLED_SYRINGE | Freq: Once | SUBCUTANEOUS | Status: DC

## 2022-07-29 MED ORDER — DENOSUMAB 60 MG/ML ~~LOC~~ SOSY
60.0000 mg | PREFILLED_SYRINGE | Freq: Once | SUBCUTANEOUS | Status: AC
  Administered 2022-07-29: 60 mg via SUBCUTANEOUS

## 2022-07-29 NOTE — Progress Notes (Signed)
Pt was given Prolia injection w/o any complications. °

## 2022-08-02 ENCOUNTER — Other Ambulatory Visit: Payer: Self-pay | Admitting: Internal Medicine

## 2022-08-02 DIAGNOSIS — I10 Essential (primary) hypertension: Secondary | ICD-10-CM

## 2022-08-08 ENCOUNTER — Other Ambulatory Visit: Payer: Self-pay | Admitting: Internal Medicine

## 2022-08-08 DIAGNOSIS — I10 Essential (primary) hypertension: Secondary | ICD-10-CM

## 2022-10-20 DIAGNOSIS — Z85828 Personal history of other malignant neoplasm of skin: Secondary | ICD-10-CM | POA: Diagnosis not present

## 2022-10-20 DIAGNOSIS — L578 Other skin changes due to chronic exposure to nonionizing radiation: Secondary | ICD-10-CM | POA: Diagnosis not present

## 2022-10-20 DIAGNOSIS — L821 Other seborrheic keratosis: Secondary | ICD-10-CM | POA: Diagnosis not present

## 2022-10-20 DIAGNOSIS — L570 Actinic keratosis: Secondary | ICD-10-CM | POA: Diagnosis not present

## 2022-10-27 ENCOUNTER — Other Ambulatory Visit (HOSPITAL_COMMUNITY): Payer: Self-pay

## 2022-10-27 ENCOUNTER — Other Ambulatory Visit: Payer: Self-pay

## 2022-11-14 ENCOUNTER — Ambulatory Visit (HOSPITAL_COMMUNITY)
Admission: EM | Admit: 2022-11-14 | Discharge: 2022-11-14 | Disposition: A | Discharge status: Home / Self Care | Payer: Medicare HMO

## 2022-11-14 ENCOUNTER — Encounter (HOSPITAL_COMMUNITY): Payer: Self-pay

## 2022-11-14 DIAGNOSIS — I10 Essential (primary) hypertension: Secondary | ICD-10-CM | POA: Diagnosis not present

## 2022-11-14 NOTE — Discharge Instructions (Addendum)
Your blood pressure currently is near goal. I suspect your elevations at home today were stress induced or possibly related to high salt intake yesterday. Your routine BP readings at prior office visits are all within normal limits. If you continue to have routine elevations, or if you develop concerning symptoms such as headache or palpitations, please return for recheck.

## 2022-11-14 NOTE — ED Provider Notes (Signed)
[facility]    CSN: [id number] Arrival date & time: 11/14/22  1905      History   Chief Complaint Chief Complaint  Patient presents with   Hypertension    HPI Diane Day is a 72 y.o. female.   Pleasant 72 year old female presents today with her husband due to concerns of elevated blood pressure.  She states she was routinely checking at this morning and noted it to be elevated beyond her normal baseline.  Her systolic is usually between 120-135.  She states she continued to check her blood pressure routinely throughout the day and felt that it kept climbing.  Highest recording was 171 systolic.  States the bottom number was close to 100.  She states she is physically active and has been swimming and running recently to perform in a competition with her daughter.  Patient denied any symptoms throughout the day.  She denied headache, dizziness, tinnitus, shortness of breath, chest pain, palpitations, blurred vision.  States she has been otherwise at her baseline health.  She believes she may have been stressed regarding the incidents.  She did have [place] food last evening, normally eats a low-salt diet. Stopped her raloxifene 6 months ago.   Hypertension    Past Medical History:  Diagnosis Date   Cancer (HCC)    basal cell, face   Family history of breast cancer    Hypertension    Osteopenia    Osteoporosis    Rectal bleeding     Patient Active Problem List   Diagnosis Date Noted   At high risk for breast cancer 06/22/2022   Encounter for general adult medical examination with abnormal findings 05/01/2020   Achilles tendonitis, bilateral 04/23/2019   Osteoporosis without current pathological fracture 03/23/2016   Family history of breast cancer 01/01/2014   Snoring 09/19/2013   Pure hypercholesterolemia 12/02/2011   Essential hypertension 09/19/2008    Past Surgical History:  Procedure Laterality Date   BREAST SURGERY Left 1998   remove  fibroadenoma   KNEE ARTHROSCOPY Left    repair broken arm  2008   TUBAL LIGATION  1994    OB History     Gravida  3   Para  3   Term  3   Preterm      AB      Living  3      SAB      IAB      Ectopic      Multiple      Live Births               Home Medications    Prior to Admission medications   Medication Sig Start Date End Date Taking? Authorizing Provider  indapamide (LOZOL) 1.25 MG tablet Take 1 tablet (1.25 mg total) by mouth daily. 08/08/22  Yes Etta Grandchild, MD  telmisartan (MICARDIS) 40 MG tablet Take 1 tablet (40 mg total) by mouth daily. 08/02/22  Yes Etta Grandchild, MD  calcium carbonate (TUMS - DOSED IN MG ELEMENTAL CALCIUM) 500 MG chewable tablet Chew 1 tablet by mouth as needed.    [provider]  Cholecalciferol (VITAMIN D3) 2000 UNITS TABS Take by mouth every morning.    [provider]  Multiple Vitamin (MULTIVITAMIN) tablet Take 1 tablet by mouth daily.    [provider]  psyllium (METAMUCIL) 58.6 % powder Take 1 packet by mouth daily.    [provider]    Family History Family History  Problem Relation Age of Onset   Diabetes Father    Heart disease Father    Hypertension Father    Esophageal cancer Father 42   Breast cancer Mother 54   Hypertension Mother    Osteoporosis Mother    Breast cancer Sister 37       BRCA negative   Diabetes Sister    Breast cancer Sister 66   Cancer Paternal Aunt        unknown form of cancer over the age of 7   Thyroid cancer Cousin        maternal first cousin   Breast cancer Cousin        died in her 58s; paternal cousin   Breast cancer Cousin        paternal first cousin   Breast cancer Sister    Osteoporosis Brother    Colon cancer Neg Hx    Stomach cancer Neg Hx    Rectal cancer Neg Hx     Social History Social History   Tobacco Use   Smoking status: Former    Packs/day: 0.50    Years: 5.00    Additional pack years: 0.00    Total pack  years: 2.50    Types: Cigarettes    Quit date: 12/02/1974    Years since quitting: 47.9   Smokeless tobacco: Never  Vaping Use   Vaping Use: Never used  Substance Use Topics   Alcohol use: Yes    Alcohol/week: 5.0 standard drinks of alcohol    Types: 5 Cans of beer per week    Comment: occ glass of wine, drink of choice beer   Drug use: No     Allergies   Patient has no known allergies.   Review of Systems Review of Systems As per HPI  Physical Exam Triage Vital Signs ED Triage Vitals [11/14/22 1944]  Enc Vitals Group     BP (!) 151/74     Pulse Rate 71     Resp 16     Temp 98.2 F (36.8 C)     Temp Source Oral     SpO2 98 %     Weight 125 lb (56.7 kg)     Height 5\' 7"  (1.702 m)     Head Circumference      Peak Flow      Pain Score 0     Pain Loc      Pain Edu?      Excl. in GC?    No data found.  Updated Vital Signs BP (!) 151/74 (BP Location: Right Arm)   Pulse 71   Temp 98.2 F (36.8 C) (Oral)   Resp 16   Ht 5\' 7"  (1.702 m)   Wt 125 lb (56.7 kg)   LMP 05/16/2006   SpO2 98%   BMI 19.58 kg/m   Visual Acuity Right Eye Distance:   Left Eye Distance:   Bilateral Distance:    Right Eye Near:   Left Eye Near:    Bilateral Near:     Physical Exam Vitals and nursing note reviewed. Exam conducted with a chaperone present.  Constitutional:      General: She is not in acute distress.    Appearance: Normal appearance. She is well-developed and normal weight. She is not ill-appearing, toxic-appearing or diaphoretic.  HENT:     Head: Normocephalic and atraumatic.     Mouth/Throat:     Mouth: Mucous membranes are moist.  Eyes:  General: No scleral icterus.       Right eye: No discharge.        Left eye: No discharge.     Extraocular Movements: Extraocular movements intact.     Conjunctiva/sclera: Conjunctivae normal.     Pupils: Pupils are equal, round, and reactive to light.  Cardiovascular:     Rate and Rhythm: Normal rate and regular  rhythm.     Pulses: Normal pulses.     Heart sounds: Normal heart sounds. No murmur heard.    No friction rub. No gallop.  Pulmonary:     Effort: Pulmonary effort is normal. No respiratory distress.     Breath sounds: Normal breath sounds. No stridor. No wheezing, rhonchi or rales.  Chest:     Chest wall: No tenderness.  Abdominal:     Tenderness: There is no abdominal tenderness.  Musculoskeletal:        General: No swelling.     Cervical back: Normal range of motion and neck supple. No rigidity.  Lymphadenopathy:     Cervical: No cervical adenopathy.  Skin:    General: Skin is warm and dry.     Coloration: Skin is not jaundiced.     Findings: No bruising, erythema or rash.  Neurological:     Mental Status: She is alert and oriented to person, place, and time.  Psychiatric:        Mood and Affect: Mood normal.      UC Treatments / Results  Labs (all labs ordered are listed, but only abnormal results are displayed) Labs Reviewed - No data to display  EKG   Radiology No results found.  Procedures Procedures (including critical care time)  Medications Ordered in UC Medications - No data to display  Initial Impression / Assessment and Plan / UC Course  I have reviewed the triage vital signs and the nursing notes.  Pertinent labs & imaging results that were available during my care of the patient were reviewed by me and considered in my medical decision making (see chart for details).  Clinical Course as of 11/14/22 2100  Mon Nov 14, 2022  2029 134/92 [WC]    Clinical Course User Index [WC] Guy Sandifer L, [place]    HTN -this appears well-controlled.  Recheck blood pressure was virtually normal and at patient's baseline.  I suspect she either had elevated blood pressure this morning secondary to high salt intake at dinner last evening, or possibly stress-induced from excessive worry from her elevated reading.  She is asymptomatic at this time and appears stable. Pt  to follow up with PCP for routine maintenance.   Final Clinical Impressions(s) / UC Diagnoses   Final diagnoses:  Primary hypertension     Discharge Instructions      Your blood pressure currently is near goal. I suspect your elevations at home today were stress induced or possibly related to high salt intake yesterday. Your routine BP readings at prior office visits are all within normal limits. If you continue to have routine elevations, or if you develop concerning symptoms such as headache or palpitations, please return for recheck.     ED Prescriptions   None    PDMP not reviewed this encounter.   Maretta Bees, [place] 11/14/22 2101

## 2022-11-14 NOTE — ED Triage Notes (Signed)
Patient here today with c/o high blood pressure. She has been checking her blood pressure today and noticed that it has been elevated. Not having any symptoms. She takes Telmisartan and indapamide. One reading read 171/90.

## 2022-11-24 ENCOUNTER — Encounter: Payer: Self-pay | Admitting: Internal Medicine

## 2023-01-03 ENCOUNTER — Other Ambulatory Visit (HOSPITAL_COMMUNITY): Payer: Self-pay

## 2023-01-12 ENCOUNTER — Encounter: Payer: Self-pay | Admitting: Internal Medicine

## 2023-01-23 ENCOUNTER — Other Ambulatory Visit (HOSPITAL_COMMUNITY): Payer: Self-pay

## 2023-01-23 ENCOUNTER — Other Ambulatory Visit: Payer: Self-pay

## 2023-01-24 ENCOUNTER — Other Ambulatory Visit: Payer: Self-pay

## 2023-01-24 ENCOUNTER — Ambulatory Visit (INDEPENDENT_AMBULATORY_CARE_PROVIDER_SITE_OTHER): Payer: Medicare HMO | Admitting: Internal Medicine

## 2023-01-24 ENCOUNTER — Encounter: Payer: Self-pay | Admitting: Internal Medicine

## 2023-01-24 ENCOUNTER — Other Ambulatory Visit (HOSPITAL_COMMUNITY): Payer: Self-pay

## 2023-01-24 VITALS — BP 122/74 | HR 72 | Temp 98.3°F | Ht 67.0 in | Wt 126.0 lb

## 2023-01-24 DIAGNOSIS — I10 Essential (primary) hypertension: Secondary | ICD-10-CM

## 2023-01-24 DIAGNOSIS — Z1211 Encounter for screening for malignant neoplasm of colon: Secondary | ICD-10-CM

## 2023-01-24 DIAGNOSIS — M810 Age-related osteoporosis without current pathological fracture: Secondary | ICD-10-CM | POA: Diagnosis not present

## 2023-01-24 DIAGNOSIS — Z23 Encounter for immunization: Secondary | ICD-10-CM | POA: Diagnosis not present

## 2023-01-24 DIAGNOSIS — E7800 Pure hypercholesterolemia, unspecified: Secondary | ICD-10-CM

## 2023-01-24 LAB — CBC WITH DIFFERENTIAL/PLATELET
Basophils Absolute: 0.1 10*3/uL (ref 0.0–0.1)
Basophils Relative: 0.8 % (ref 0.0–3.0)
Eosinophils Absolute: 0 10*3/uL (ref 0.0–0.7)
Eosinophils Relative: 0.4 % (ref 0.0–5.0)
HCT: 42.7 % (ref 36.0–46.0)
Hemoglobin: 13.7 g/dL (ref 12.0–15.0)
Lymphocytes Relative: 37.7 % (ref 12.0–46.0)
Lymphs Abs: 2.9 10*3/uL (ref 0.7–4.0)
MCHC: 32.1 g/dL (ref 30.0–36.0)
MCV: 93.3 fl (ref 78.0–100.0)
Monocytes Absolute: 0.8 10*3/uL (ref 0.1–1.0)
Monocytes Relative: 10 % (ref 3.0–12.0)
Neutro Abs: 3.9 10*3/uL (ref 1.4–7.7)
Neutrophils Relative %: 51.1 % (ref 43.0–77.0)
Platelets: 308 10*3/uL (ref 150.0–400.0)
RBC: 4.58 Mil/uL (ref 3.87–5.11)
RDW: 13.8 % (ref 11.5–15.5)
WBC: 7.6 10*3/uL (ref 4.0–10.5)

## 2023-01-24 LAB — VITAMIN D 25 HYDROXY (VIT D DEFICIENCY, FRACTURES): VITD: 71.05 ng/mL (ref 30.00–100.00)

## 2023-01-24 MED ORDER — BOOSTRIX 5-2.5-18.5 LF-MCG/0.5 IM SUSP
0.5000 mL | Freq: Once | INTRAMUSCULAR | 0 refills | Status: AC
Start: 2023-01-24 — End: 2023-01-26
  Filled 2023-01-24: qty 0.5, 1d supply, fill #0

## 2023-01-24 MED ORDER — DENOSUMAB 60 MG/ML ~~LOC~~ SOSY
60.0000 mg | PREFILLED_SYRINGE | Freq: Once | SUBCUTANEOUS | 1 refills | Status: AC
Start: 2023-01-24 — End: 2023-02-01
  Filled 2023-01-24: qty 1, 1d supply, fill #0
  Filled 2023-01-31: qty 1, 180d supply, fill #0
  Filled 2023-07-17: qty 1, 180d supply, fill #1

## 2023-01-24 MED ORDER — SHINGRIX 50 MCG/0.5ML IM SUSR
0.5000 mL | Freq: Once | INTRAMUSCULAR | 1 refills | Status: AC
Start: 2023-01-24 — End: 2023-01-24
  Filled 2023-01-24: qty 0.5, 1d supply, fill #0

## 2023-01-24 NOTE — Progress Notes (Signed)
Subjective:  Patient ID: Diane Day, female    DOB: [birth year]  Age: 72 y.o. MRN: [id number]  CC: Hypertension   HPI Diane Day presents for f/up -----  Discussed the use of AI scribe software for clinical note transcription with the patient, who gave verbal consent to proceed.  History of Present Illness   The patient, an active swimmer, presents with concerns about their heart rate during exercise. They report good endurance during swimming and no symptoms of chest pain, shortness of breath, or lightheadedness. They have been monitoring their heart rate, which reaches up to 147 during exercise and drops to the low 60s during sleep. They are preparing for an upcoming open water swim expected to last an hour and a half.  The patient also mentions a history of colonoscopy 10 years ago, which was normal, and expresses a preference for another colonoscopy over a Cologuard test. They are also due for a Prolia injection for bone health, which they plan to receive at a local pharmacy.       Outpatient Medications Prior to Visit  Medication Sig Dispense Refill   calcium carbonate (TUMS - DOSED IN MG ELEMENTAL CALCIUM) 500 MG chewable tablet Chew 1 tablet by mouth as needed.     Cholecalciferol (VITAMIN D3) 2000 UNITS TABS Take by mouth every morning.     indapamide (LOZOL) 1.25 MG tablet Take 1 tablet (1.25 mg total) by mouth daily. 90 tablet 1   Multiple Vitamin (MULTIVITAMIN) tablet Take 1 tablet by mouth daily.     psyllium (METAMUCIL) 58.6 % powder Take 1 packet by mouth daily.     telmisartan (MICARDIS) 40 MG tablet Take 1 tablet (40 mg total) by mouth daily. 90 tablet 1   denosumab (PROLIA) injection 60 mg      No facility-administered medications prior to visit.    ROS Review of Systems  Constitutional: Negative.  Negative for diaphoresis and fatigue.  HENT: Negative.    Eyes: Negative.   Respiratory: Negative.  Negative for cough, chest tightness, shortness  of breath and wheezing.   Cardiovascular:  Negative for palpitations and leg swelling.  Gastrointestinal:  Negative for abdominal pain, constipation, diarrhea, nausea and vomiting.  Genitourinary: Negative.  Negative for difficulty urinating.  Musculoskeletal: Negative.  Negative for arthralgias and myalgias.  Skin: Negative.   Neurological:  Negative for dizziness and weakness.  Hematological:  Negative for adenopathy. Does not bruise/bleed easily.  Psychiatric/Behavioral: Negative.      Objective:  BP 122/74 (BP Location: Left Arm, Patient Position: Sitting, Cuff Size: Normal)   Pulse 72   Temp 98.3 F (36.8 C) (Oral)   Ht 5\' 7"  (1.702 m)   Wt 126 lb (57.2 kg)   LMP 05/16/2006   SpO2 95%   BMI 19.73 kg/m   BP Readings from Last 3 Encounters:  01/24/23 122/74  11/14/22 (!) 151/74  07/25/22 128/82    Wt Readings from Last 3 Encounters:  01/24/23 126 lb (57.2 kg)  11/14/22 125 lb (56.7 kg)  07/25/22 130 lb (59 kg)    Physical Exam Vitals reviewed.  Constitutional:      Appearance: Normal appearance.  HENT:     Mouth/Throat:     Mouth: Mucous membranes are moist.  Eyes:     General: No scleral icterus.    Conjunctiva/sclera: Conjunctivae normal.  Cardiovascular:     Rate and Rhythm: Normal rate and regular rhythm.     Heart sounds: No murmur heard.    No  gallop.  Pulmonary:     Effort: Pulmonary effort is normal.     Breath sounds: No stridor. No wheezing, rhonchi or rales.  Abdominal:     General: Abdomen is flat.     Palpations: There is no mass.     Tenderness: There is no abdominal tenderness. There is no guarding.     Hernia: No hernia is present.  Musculoskeletal:        General: Normal range of motion.     Cervical back: Neck supple.     Right lower leg: No edema.     Left lower leg: No edema.  Lymphadenopathy:     Cervical: No cervical adenopathy.  Skin:    General: Skin is warm and dry.  Neurological:     General: No focal deficit present.      Mental Status: She is alert. Mental status is at baseline.  Psychiatric:        Mood and Affect: Mood normal.        Behavior: Behavior normal.     Lab Results  Component Value Date   WBC 7.6 01/24/2023   HGB 13.7 01/24/2023   HCT 42.7 01/24/2023   PLT 308.0 01/24/2023   GLUCOSE 88 01/24/2023   CHOL 184 01/24/2023   TRIG 51.0 01/24/2023   HDL 95.00 01/24/2023   LDLCALC 78 01/24/2023   ALT 16 06/22/2022   AST 19 06/22/2022   NA 140 01/24/2023   K 3.8 01/24/2023   CL 102 01/24/2023   CREATININE 0.77 01/24/2023   BUN 14 01/24/2023   CO2 29 01/24/2023   TSH 0.60 06/22/2022   INR 1.1 (H) 04/23/2019   HGBA1C 5.6 11/20/2014    No results found.  Assessment & Plan:   Pure hypercholesterolemia- Statin is not indicated. -     Lipid panel; Future -     Lipoprotein A (LPA); Future  Essential hypertension- Her BP is well controlled. -     Basic metabolic panel; Future -     CBC with Differential/Platelet; Future  Flu vaccine need -     Flu Vaccine Trivalent High Dose (Fluad)  Age-related osteoporosis without current pathological fracture -     Denosumab; Inject 60 mg into the skin once for 1 dose.  Dispense: 1 mL; Refill: 1 -     Phosphorus; Future -     VITAMIN D 25 Hydroxy (Vit-D Deficiency, Fractures); Future  Need for varicella vaccine -     Shingrix; Inject 0.5 mLs into the muscle once for 1 dose.  Dispense: 0.5 mL; Refill: 1  Need for prophylactic vaccination with combined diphtheria-tetanus-pertussis (DTP) vaccine -     Boostrix; Inject 0.5 mLs into the muscle once for 1 dose.  Dispense: 0.5 mL; Refill: 0  Screening for colon cancer -     Ambulatory referral to Gastroenterology     Follow-up: Return in about 6 months (around 07/24/2023).  Sanda Linger, MD

## 2023-01-24 NOTE — Patient Instructions (Signed)
Hypertension, Adult High blood pressure (hypertension) is when the force of blood pumping through the arteries is too strong. The arteries are the blood vessels that carry blood from the heart throughout the body. Hypertension forces the heart to work harder to pump blood and may cause arteries to become narrow or stiff. Untreated or uncontrolled hypertension can lead to a heart attack, heart failure, a stroke, kidney disease, and other problems. A blood pressure reading consists of a higher number over a lower number. Ideally, your blood pressure should be below 120/80. The first ("top") number is called the systolic pressure. It is a measure of the pressure in your arteries as your heart beats. The second ("bottom") number is called the diastolic pressure. It is a measure of the pressure in your arteries as the heart relaxes. What are the causes? The exact cause of this condition is not known. There are some conditions that result in high blood pressure. What increases the risk? Certain factors may make you more likely to develop high blood pressure. Some of these risk factors are under your control, including: Smoking. Not getting enough exercise or physical activity. Being overweight. Having too much fat, sugar, calories, or salt (sodium) in your diet. Drinking too much alcohol. Other risk factors include: Having a personal history of heart disease, diabetes, high cholesterol, or kidney disease. Stress. Having a family history of high blood pressure and high cholesterol. Having obstructive sleep apnea. Age. The risk increases with age. What are the signs or symptoms? High blood pressure may not cause symptoms. Very high blood pressure (hypertensive crisis) may cause: Headache. Fast or irregular heartbeats (palpitations). Shortness of breath. Nosebleed. Nausea and vomiting. Vision changes. Severe chest pain, dizziness, and seizures. How is this diagnosed? This condition is diagnosed by  measuring your blood pressure while you are seated, with your arm resting on a flat surface, your legs uncrossed, and your feet flat on the floor. The cuff of the blood pressure monitor will be placed directly against the skin of your upper arm at the level of your heart. Blood pressure should be measured at least twice using the same arm. Certain conditions can cause a difference in blood pressure between your right and left arms. If you have a high blood pressure reading during one visit or you have normal blood pressure with other risk factors, you may be asked to: Return on a different day to have your blood pressure checked again. Monitor your blood pressure at home for 1 week or longer. If you are diagnosed with hypertension, you may have other blood or imaging tests to help your health care provider understand your overall risk for other conditions. How is this treated? This condition is treated by making healthy lifestyle changes, such as eating healthy foods, exercising more, and reducing your alcohol intake. You may be referred for counseling on a healthy diet and physical activity. Your health care provider may prescribe medicine if lifestyle changes are not enough to get your blood pressure under control and if: Your systolic blood pressure is above 130. Your diastolic blood pressure is above 80. Your personal target blood pressure may vary depending on your medical conditions, your age, and other factors. Follow these instructions at home: Eating and drinking  Eat a diet that is high in fiber and potassium, and low in sodium, added sugar, and fat. An example of this eating plan is called the DASH diet. DASH stands for Dietary Approaches to Stop Hypertension. To eat this way: Eat   plenty of fresh fruits and vegetables. Try to fill one half of your plate at each meal with fruits and vegetables. Eat whole grains, such as whole-wheat pasta, brown rice, or whole-grain bread. Fill about one  fourth of your plate with whole grains. Eat or drink low-fat dairy products, such as skim milk or low-fat yogurt. Avoid fatty cuts of meat, processed or cured meats, and poultry with skin. Fill about one fourth of your plate with lean proteins, such as fish, chicken without skin, beans, eggs, or tofu. Avoid pre-made and processed foods. These tend to be higher in sodium, added sugar, and fat. Reduce your daily sodium intake. Many people with hypertension should eat less than 1,500 mg of sodium a day. Do not drink alcohol if: Your health care provider tells you not to drink. You are pregnant, may be pregnant, or are planning to become pregnant. If you drink alcohol: Limit how much you have to: 0-1 drink a day for women. 0-2 drinks a day for men. Know how much alcohol is in your drink. In the [place] one drink equals one 12 oz bottle of beer (355 mL), one 5 oz glass of wine (148 mL), or one 1 oz glass of hard liquor (44 mL). Lifestyle  Work with your health care provider to maintain a healthy body weight or to lose weight. Ask what an ideal weight is for you. Get at least 30 minutes of exercise that causes your heart to beat faster (aerobic exercise) most days of the week. Activities may include walking, swimming, or biking. Include exercise to strengthen your muscles (resistance exercise), such as Pilates or lifting weights, as part of your weekly exercise routine. Try to do these types of exercises for 30 minutes at least 3 days a week. Do not use any products that contain nicotine or tobacco. These products include cigarettes, chewing tobacco, and vaping devices, such as e-cigarettes. If you need help quitting, ask your health care provider. Monitor your blood pressure at home as told by your health care provider. Keep all follow-up visits. This is important. Medicines Take over-the-counter and prescription medicines only as told by your health care provider. Follow directions carefully. Blood  pressure medicines must be taken as prescribed. Do not skip doses of blood pressure medicine. Doing this puts you at risk for problems and can make the medicine less effective. Ask your health care provider about side effects or reactions to medicines that you should watch for. Contact a health care provider if you: Think you are having a reaction to a medicine you are taking. Have headaches that keep coming back (recurring). Feel dizzy. Have swelling in your ankles. Have trouble with your vision. Get help right away if you: Develop a severe headache or confusion. Have unusual weakness or numbness. Feel faint. Have severe pain in your chest or abdomen. Vomit repeatedly. Have trouble breathing. These symptoms may be an emergency. Get help right away. Call 911. Do not wait to see if the symptoms will go away. Do not drive yourself to the hospital. Summary Hypertension is when the force of blood pumping through your arteries is too strong. If this condition is not controlled, it may put you at risk for serious complications. Your personal target blood pressure may vary depending on your medical conditions, your age, and other factors. For most people, a normal blood pressure is less than 120/80. Hypertension is treated with lifestyle changes, medicines, or a combination of both. Lifestyle changes include losing weight, eating a healthy,   low-sodium diet, exercising more, and limiting alcohol. This information is not intended to replace advice given to you by your health care provider. Make sure you discuss any questions you have with your health care provider. Document Revised: 03/09/2021 Document Reviewed: 03/09/2021 Elsevier Patient Education  2024 Elsevier Inc.  

## 2023-01-25 ENCOUNTER — Other Ambulatory Visit: Payer: Self-pay

## 2023-01-25 ENCOUNTER — Other Ambulatory Visit (HOSPITAL_COMMUNITY): Payer: Self-pay

## 2023-01-25 LAB — BASIC METABOLIC PANEL
BUN: 14 mg/dL (ref 6–23)
CO2: 29 meq/L (ref 19–32)
Calcium: 10.3 mg/dL (ref 8.4–10.5)
Chloride: 102 meq/L (ref 96–112)
Creatinine, Ser: 0.77 mg/dL (ref 0.40–1.20)
GFR: 78.52 mL/min (ref >60.00)
Glucose, Bld: 88 mg/dL (ref 70–99)
Potassium: 3.8 meq/L (ref 3.5–5.1)
Sodium: 140 meq/L (ref 135–145)

## 2023-01-25 LAB — LIPID PANEL
Cholesterol: 184 mg/dL (ref 0–200)
HDL: 95 mg/dL (ref >39.00)
LDL Cholesterol: 78 mg/dL (ref 0–99)
NonHDL: 88.51
Total CHOL/HDL Ratio: 2
Triglycerides: 51 mg/dL (ref 0.0–149.0)
VLDL: 10.2 mg/dL (ref 0.0–40.0)

## 2023-01-25 LAB — PHOSPHORUS: Phosphorus: 3.8 mg/dL (ref 2.3–4.6)

## 2023-01-25 NOTE — Telephone Encounter (Signed)
The PA team does not handle scheduling. Patient also has an authorization in effect until 07-02-2023 so no PA needed at this time.

## 2023-01-25 NOTE — Telephone Encounter (Signed)
All good! Encounter from 07-01-2022 has PA information

## 2023-01-26 ENCOUNTER — Other Ambulatory Visit (HOSPITAL_COMMUNITY): Payer: Self-pay

## 2023-01-26 NOTE — Progress Notes (Signed)
This encounter was created in error - please disregard.  This encounter was created in error - please disregard.

## 2023-01-26 NOTE — Patient Instructions (Signed)
      Blood work was ordered.   The lab is on the first floor.    Medications changes include :       A referral was ordered and someone will call you to schedule an appointment.     No follow-ups on file.  

## 2023-01-27 ENCOUNTER — Other Ambulatory Visit: Payer: Self-pay

## 2023-01-28 LAB — LIPOPROTEIN A (LPA): Lipoprotein (a): <10 nmol/L (ref <75)

## 2023-01-31 ENCOUNTER — Other Ambulatory Visit (HOSPITAL_COMMUNITY): Payer: Self-pay

## 2023-01-31 ENCOUNTER — Telehealth: Payer: Self-pay

## 2023-01-31 ENCOUNTER — Other Ambulatory Visit: Payer: Self-pay

## 2023-01-31 NOTE — Telephone Encounter (Signed)
[facility] is not able to get pts Prolia shot here in time for apptmnt tomorrow.   However, they are sending in the shot to be placed in our office supply as we can use one of our own shots and charge as pt supplied.  ** So, when pts prolia arrive please remove pts information from shot and put in the fridge to be used as regular for another pt.  This has been approved by Carollee Herter as well.

## 2023-02-01 ENCOUNTER — Ambulatory Visit: Payer: Medicare HMO

## 2023-02-01 ENCOUNTER — Other Ambulatory Visit: Payer: Self-pay

## 2023-02-01 ENCOUNTER — Other Ambulatory Visit: Payer: Self-pay | Admitting: Internal Medicine

## 2023-02-01 DIAGNOSIS — I10 Essential (primary) hypertension: Secondary | ICD-10-CM

## 2023-02-01 DIAGNOSIS — M810 Age-related osteoporosis without current pathological fracture: Secondary | ICD-10-CM

## 2023-02-01 MED ORDER — DENOSUMAB 60 MG/ML ~~LOC~~ SOSY
60.0000 mg | PREFILLED_SYRINGE | Freq: Once | SUBCUTANEOUS | Status: AC
Start: 2023-02-01 — End: 2023-02-01
  Administered 2023-02-01: 60 mg via SUBCUTANEOUS

## 2023-02-01 NOTE — Progress Notes (Signed)
After obtaining consent, and per orders of Dr. Yetta Barre, injection of Prolia given by Ferdie Ping. Patient instructed to report any adverse reaction to me immediately.

## 2023-02-04 ENCOUNTER — Encounter (HOSPITAL_COMMUNITY): Payer: Self-pay

## 2023-02-17 ENCOUNTER — Other Ambulatory Visit: Payer: Self-pay | Admitting: Internal Medicine

## 2023-02-17 DIAGNOSIS — Z1211 Encounter for screening for malignant neoplasm of colon: Secondary | ICD-10-CM

## 2023-02-17 DIAGNOSIS — Z1212 Encounter for screening for malignant neoplasm of rectum: Secondary | ICD-10-CM

## 2023-02-27 ENCOUNTER — Telehealth: Payer: Self-pay | Admitting: *Deleted

## 2023-02-27 NOTE — Telephone Encounter (Signed)
Attempting to reach patient for PV Attempted at 0901 and 0914.  Left VM requesting call back to reschedule PV to avoid cancellation of colonoscopy 03/29/23

## 2023-03-09 ENCOUNTER — Ambulatory Visit: Payer: Medicare HMO | Admitting: *Deleted

## 2023-03-09 ENCOUNTER — Encounter: Payer: Self-pay | Admitting: Internal Medicine

## 2023-03-09 VITALS — Ht 66.5 in | Wt 125.0 lb

## 2023-03-09 DIAGNOSIS — Z1211 Encounter for screening for malignant neoplasm of colon: Secondary | ICD-10-CM

## 2023-03-09 MED ORDER — NA SULFATE-K SULFATE-MG SULF 17.5-3.13-1.6 GM/177ML PO SOLN
1.0000 | Freq: Once | ORAL | 0 refills | Status: AC
Start: 2023-03-09 — End: 2023-03-09

## 2023-03-09 NOTE — Progress Notes (Signed)
Pt's name and DOB verified at the beginning of the pre-visit wit 2 identifiers  Pt denies any difficulty with ambulating,sitting, laying down or rolling side to side  Gave both [facility] main # and MD on call # prior to instructions.   No egg or soy allergy known to patient   No issues known to pt with past sedation with any surgeries or procedures  Pt denies having issues being intubated  Pt has no issues moving head neck or swallowing  No FH of Malignant Hyperthermia  Pt is not on diet pills or shots  Pt is not on home 02   Pt is not on blood thinners   Pt denies issues with constipation   Pt is not on dialysis  Pt denise any abnormal heart rhythms   Pt denies any upcoming cardiac testing  Pt encouraged to use to use Singlecare or Goodrx to reduce cost   Patient's chart reviewed by Cathlyn Parsons CNRA prior to pre-visit and patient appropriate for the [facility].  Pre-visit completed and red dot placed by patient's name on their procedure day (on provider's schedule).  .  Visit by phone  Pt states weight is 125 lb  Instructed pt why it is important to and  to call if they have any changes in health or new medications. Directed them to the # given and on instructions.     Instructions reviewed with pt and pt states understanding. Instructed to review again prior to procedure. Pt states they will.   Instructions sent by mail with coupon and by my chart

## 2023-03-28 ENCOUNTER — Encounter: Payer: Self-pay | Admitting: Certified Registered Nurse Anesthetist

## 2023-03-29 ENCOUNTER — Encounter: Payer: Self-pay | Admitting: Internal Medicine

## 2023-03-29 ENCOUNTER — Ambulatory Visit: Payer: Medicare HMO | Admitting: Internal Medicine

## 2023-03-29 VITALS — BP 117/72 | HR 67 | Temp 97.5°F | Resp 16 | Ht 66.5 in | Wt 125.0 lb

## 2023-03-29 DIAGNOSIS — D122 Benign neoplasm of ascending colon: Secondary | ICD-10-CM | POA: Diagnosis not present

## 2023-03-29 DIAGNOSIS — Z1211 Encounter for screening for malignant neoplasm of colon: Secondary | ICD-10-CM

## 2023-03-29 DIAGNOSIS — D120 Benign neoplasm of cecum: Secondary | ICD-10-CM | POA: Diagnosis not present

## 2023-03-29 DIAGNOSIS — I10 Essential (primary) hypertension: Secondary | ICD-10-CM | POA: Diagnosis not present

## 2023-03-29 MED ORDER — SODIUM CHLORIDE 0.9 % IV SOLN: 500.0000 mL | INTRAVENOUS | Status: DC

## 2023-03-29 NOTE — Progress Notes (Signed)
Pt's states no medical or surgical changes since previsit or office visit. 

## 2023-03-29 NOTE — Progress Notes (Signed)
Called to room to assist during endoscopic procedure.  Patient ID and intended procedure confirmed with present staff. Received instructions for my participation in the procedure from the performing physician.  

## 2023-03-29 NOTE — Progress Notes (Signed)
GASTROENTEROLOGY PROCEDURE H&P NOTE   Primary Care Physician: Etta Grandchild, MD    Reason for Procedure:   Colon cancer screening  Plan:    Colonoscopy  Patient is appropriate for endoscopic procedure(s) in the ambulatory ([facility]) setting.  The nature of the procedure, as well as the risks, benefits, and alternatives were carefully and thoroughly reviewed with the patient. Ample time for discussion and questions allowed. The patient understood, was satisfied, and agreed to proceed.     HPI: Diane Day is a 72 y.o. female who presents for colonoscopy for colon cancer screening. Denies blood in stools, changes in bowel habits, or unintentional weight loss. Denies family history of colon cancer. Last colonoscopy in 2014 was normal.   Past Medical History:  Diagnosis Date   Cancer (HCC)    basal cell, face   Family history of breast cancer    Hypertension    Osteopenia    Osteoporosis    Rectal bleeding     Past Surgical History:  Procedure Laterality Date   BREAST SURGERY Left 1998   remove fibroadenoma   KNEE ARTHROSCOPY Left    repair broken arm  2008   TUBAL LIGATION  1994    Prior to Admission medications   Medication Sig Start Date End Date Taking? Authorizing Provider  calcium carbonate (TUMS - DOSED IN MG ELEMENTAL CALCIUM) 500 MG chewable tablet Chew 1 tablet by mouth as needed.   Yes [provider]  Cholecalciferol (VITAMIN D3) 2000 UNITS TABS Take by mouth every morning.   Yes [provider]  indapamide (LOZOL) 1.25 MG tablet TAKE ONE TABLET BY MOUTH DAILY 02/01/23  Yes Etta Grandchild, MD  Multiple Vitamin (MULTIVITAMIN) tablet Take 1 tablet by mouth daily.   Yes [provider]  telmisartan (MICARDIS) 40 MG tablet Take 1 tablet (40 mg total) by mouth daily. 02/01/23  Yes Etta Grandchild, MD  psyllium (METAMUCIL) 58.6 % powder Take 1 packet by mouth daily. Patient not taking: Reported on 03/09/2023    [provider]    Current Outpatient Medications  Medication Sig Dispense Refill   calcium carbonate (TUMS - DOSED IN MG ELEMENTAL CALCIUM) 500 MG chewable tablet Chew 1 tablet by mouth as needed.     Cholecalciferol (VITAMIN D3) 2000 UNITS TABS Take by mouth every morning.     indapamide (LOZOL) 1.25 MG tablet TAKE ONE TABLET BY MOUTH DAILY 90 tablet 1   Multiple Vitamin (MULTIVITAMIN) tablet Take 1 tablet by mouth daily.     telmisartan (MICARDIS) 40 MG tablet Take 1 tablet (40 mg total) by mouth daily. 90 tablet 1   psyllium (METAMUCIL) 58.6 % powder Take 1 packet by mouth daily. (Patient not taking: Reported on 03/09/2023)     Current Facility-Administered Medications  Medication Dose Route Frequency Provider Last Rate Last Admin   0.9 %  sodium chloride infusion  500 mL Intravenous Continuous Imogene Burn, MD        Allergies as of 03/29/2023   (No Known Allergies)    Family History  Problem Relation Age of Onset   Breast cancer Mother 1   Hypertension Mother    Osteoporosis Mother    Diabetes Father    Heart disease Father    Hypertension Father    Esophageal cancer Father 81   Breast cancer Sister 37       BRCA negative   Diabetes Sister    Breast cancer Sister 97   Breast cancer  Sister    Osteoporosis Brother    Cancer Paternal Aunt        unknown form of cancer over the age of 80   Thyroid cancer Cousin        maternal first cousin   Breast cancer Cousin        died in her 35s; paternal cousin   Breast cancer Cousin        paternal first cousin   Colon cancer Neg Hx    Stomach cancer Neg Hx    Rectal cancer Neg Hx    Colon polyps Neg Hx     Social History   Socioeconomic History   Marital status: Married    Spouse name: Not on file   Number of children: 3   Years of education: Not on file   Highest education level: Bachelor's degree (e.g., BA, AB, BS)  Occupational History    Employer: [employer]  Tobacco Use   Smoking status: Former     Current packs/day: 0.00    Average packs/day: 0.5 packs/day for 5.0 years (2.5 ttl pk-yrs)    Types: Cigarettes    Start date: 12/01/1969    Quit date: 12/02/1974    Years since quitting: 48.3   Smokeless tobacco: Never  Vaping Use   Vaping status: Never Used  Substance and Sexual Activity   Alcohol use: Yes    Alcohol/week: 5.0 standard drinks of alcohol    Types: 5 Cans of beer per week    Comment: occ glass of wine, drink of choice beer   Drug use: No   Sexual activity: Yes    Partners: Male    Birth control/protection: Post-menopausal  Other Topics Concern   Not on file  Social History Narrative   Daily caffeine use   Social Determinants of Health   Financial Resource Strain: Low Risk  (01/22/2023)   Overall Financial Resource Strain (CARDIA)    Difficulty of Paying Living Expenses: Not hard at all  Food Insecurity: No Food Insecurity (01/22/2023)   Hunger Vital Sign    Worried About Running Out of Food in the Last Year: Never true    Ran Out of Food in the Last Year: Never true  Transportation Needs: No Transportation Needs (01/22/2023)   PRAPARE - Administrator, Civil Service (Medical): No    Lack of Transportation (Non-Medical): No  Physical Activity: Sufficiently Active (01/22/2023)   Exercise Vital Sign    Days of Exercise per Week: 6 days    Minutes of Exercise per Session: 60 min  Stress: No Stress Concern Present (01/22/2023)   [employer] of Occupational Health - Occupational Stress Questionnaire    Feeling of Stress : Not at all  Social Connections: Socially Integrated (01/22/2023)   Social Connection and Isolation Panel [NHANES]    Frequency of Communication with Friends and Family: More than three times a week    Frequency of Social Gatherings with Friends and Family: More than three times a week    Attends Religious Services: 1 to 4 times per year    Active Member of [employer] or Organizations: Yes    Attends Banker Meetings: More than 4  times per year    Marital Status: Married  Catering manager Violence: Not At Risk (06/07/2022)   Humiliation, Afraid, Rape, and Kick questionnaire    Fear of Current or Ex-Partner: No    Emotionally Abused: No    Physically Abused: No    Sexually Abused: No  Physical Exam: Vital signs in last 24 hours: BP 126/83   Pulse 76   Temp (!) 97.5 F (36.4 C)   Resp 14   Ht 5' 6.5" (1.689 m)   Wt 125 lb (56.7 kg)   LMP 05/16/2006   SpO2 99%   BMI 19.87 kg/m  GEN: NAD EYE: Sclerae anicteric ENT: MMM CV: Non-tachycardic Pulm: No increased work of breathing GI: Soft, NT/ND NEURO:  Alert & Oriented   Eulah Pont, MD [facility]  03/29/2023 8:26 AM

## 2023-03-29 NOTE — Patient Instructions (Signed)
Handouts given on polyps, hemorrhoids and diverticulosis.  YOU HAD AN ENDOSCOPIC PROCEDURE TODAY AT THE [facility]:   Refer to the procedure report that was given to you for any specific questions about what was found during the examination.  If the procedure report does not answer your questions, please call your gastroenterologist to clarify.  If you requested that your care partner not be given the details of your procedure findings, then the procedure report has been included in a sealed envelope for you to review at your convenience later.  YOU SHOULD EXPECT: Some feelings of bloating in the abdomen. Passage of more gas than usual.  Walking can help get rid of the air that was put into your GI tract during the procedure and reduce the bloating. If you had a lower endoscopy (such as a colonoscopy or flexible sigmoidoscopy) you may notice spotting of blood in your stool or on the toilet paper. If you underwent a bowel prep for your procedure, you may not have a normal bowel movement for a few days.  Please Note:  You might notice some irritation and congestion in your nose or some drainage.  This is from the oxygen used during your procedure.  There is no need for concern and it should clear up in a day or so.  SYMPTOMS TO REPORT IMMEDIATELY:  Following lower endoscopy (colonoscopy or flexible sigmoidoscopy):  Excessive amounts of blood in the stool  Significant tenderness or worsening of abdominal pains  Swelling of the abdomen that is new, acute  Fever of 100F or higher  For urgent or emergent issues, a gastroenterologist can be reached at any hour by calling [phone]. Do not use MyChart messaging for urgent concerns.    DIET:  We do recommend a small meal at first, but then you may proceed to your regular diet.  Drink plenty of fluids but you should avoid alcoholic beverages for 24 hours.  ACTIVITY:  You should plan to take it easy for the rest of today and you should  NOT DRIVE or use heavy machinery until tomorrow (because of the sedation medicines used during the test).    FOLLOW UP: Our staff will call the number listed on your records the next business day following your procedure.  We will call around 7:15- 8:00 am to check on you and address any questions or concerns that you may have regarding the information given to you following your procedure. If we do not reach you, we will leave a message.     If any biopsies were taken you will be contacted by phone or by letter within the next 1-3 weeks.  Please call us at [phone] if you have not heard about the biopsies in 3 weeks.    SIGNATURES/CONFIDENTIALITY: You and/or your care partner have signed paperwork which will be entered into your electronic medical record.  These signatures attest to the fact that that the information above on your After Visit Summary has been reviewed and is understood.  Full responsibility of the confidentiality of this discharge information lies with you and/or your care-partner. 

## 2023-03-29 NOTE — Op Note (Signed)
[facility] Patient Name: Diane Day Procedure Date: 03/29/2023 8:04 AM MRN: [id number] Endoscopist: Madelyn Brunner [place] , , [id number] Age: 72 Referring MD:  Date of Birth: Feb 26, 1951 Gender: Female Account #: [id number] Procedure:                Colonoscopy Indications:              Screening for colorectal malignant neoplasm Medicines:                Monitored Anesthesia Care Procedure:                Pre-Anesthesia Assessment:                           - Prior to the procedure, a History and Physical                            was performed, and patient medications and                            allergies were reviewed. The patient's tolerance of                            previous anesthesia was also reviewed. The risks                            and benefits of the procedure and the sedation                            options and risks were discussed with the patient.                            All questions were answered, and informed consent                            was obtained. Prior Anticoagulants: The patient has                            taken no anticoagulant or antiplatelet agents. ASA                            Grade Assessment: II - A patient with mild systemic                            disease. After reviewing the risks and benefits,                            the patient was deemed in satisfactory condition to                            undergo the procedure.                           After obtaining informed consent, the colonoscope  was passed under direct vision. Throughout the                            procedure, the patient's blood pressure, pulse, and                            oxygen saturations were monitored continuously. The                            PCF-HQ190L Colonoscope [id number] was introduced                            through the anus and advanced to the the terminal                            ileum.  The colonoscopy was performed without                            difficulty. The patient tolerated the procedure                            well. The quality of the bowel preparation was                            good. The terminal ileum, ileocecal valve,                            appendiceal orifice, and rectum were photographed. Scope In: 8:28:22 AM Scope Out: 8:42:53 AM Scope Withdrawal Time: 0 hours 9 minutes 8 seconds  Total Procedure Duration: 0 hours 14 minutes 31 seconds  Findings:                 The terminal ileum appeared normal.                           Three sessile polyps were found in the ascending                            colon and cecum. The polyps were 3 to 6 mm in size.                            These polyps were removed with a cold snare.                            Resection and retrieval were complete.                           Multiple diverticula were found in the sigmoid                            colon.                           Non-bleeding internal hemorrhoids were found during  retroflexion. Complications:            No immediate complications. Estimated Blood Loss:     Estimated blood loss was minimal. Impression:               - The examined portion of the ileum was normal.                           - Three 3 to 6 mm polyps in the ascending colon and                            in the cecum, removed with a cold snare. Resected                            and retrieved.                           - Diverticulosis in the sigmoid colon.                           - Non-bleeding internal hemorrhoids. Recommendation:           - Discharge patient to home (with escort).                           - Await pathology results.                           - The findings and recommendations were discussed                            with the patient. Dr Particia Lather "Alan Ripper" Leonides Schanz,  03/29/2023 8:49:32 AM

## 2023-03-30 ENCOUNTER — Telehealth: Payer: Self-pay | Admitting: *Deleted

## 2023-03-30 NOTE — Telephone Encounter (Signed)
  Follow up Call-     03/29/2023    7:30 AM  Call back number  Post procedure Call Back phone  # [phone]  Permission to leave phone message Yes    Post procedure follow up phone call. No answer at number given.  Left message on voicemail.

## 2023-03-31 ENCOUNTER — Encounter: Payer: Self-pay | Admitting: Internal Medicine

## 2023-03-31 LAB — SURGICAL PATHOLOGY

## 2023-04-06 DIAGNOSIS — Z1231 Encounter for screening mammogram for malignant neoplasm of breast: Secondary | ICD-10-CM | POA: Diagnosis not present

## 2023-04-07 ENCOUNTER — Encounter: Payer: Self-pay | Admitting: Obstetrics and Gynecology

## 2023-04-21 DIAGNOSIS — H16201 Unspecified keratoconjunctivitis, right eye: Secondary | ICD-10-CM | POA: Diagnosis not present

## 2023-05-01 ENCOUNTER — Telehealth: Payer: Medicare HMO | Admitting: Family Medicine

## 2023-05-01 DIAGNOSIS — J019 Acute sinusitis, unspecified: Secondary | ICD-10-CM | POA: Diagnosis not present

## 2023-05-01 DIAGNOSIS — B9689 Other specified bacterial agents as the cause of diseases classified elsewhere: Secondary | ICD-10-CM | POA: Diagnosis not present

## 2023-05-01 MED ORDER — DOXYCYCLINE HYCLATE 100 MG PO TABS: 100.0000 mg | ORAL_TABLET | Freq: Two times a day (BID) | ORAL | 0 refills | Status: AC

## 2023-05-01 MED ORDER — GUAIFENESIN ER 600 MG PO TB12: 600.0000 mg | ORAL_TABLET | Freq: Two times a day (BID) | ORAL | 0 refills | Status: AC

## 2023-05-01 NOTE — Progress Notes (Signed)
Virtual Visit Consent   Diane Day, you are scheduled for a virtual visit with a [facility] provider today. Just as with appointments in the office, your consent must be obtained to participate. Your consent will be active for this visit and any virtual visit you may have with one of our providers in the next 365 days. If you have a MyChart account, a copy of this consent can be sent to you electronically.  As this is a virtual visit, video technology does not allow for your provider to perform a traditional examination. This may limit your provider's ability to fully assess your condition. If your provider identifies any concerns that need to be evaluated in person or the need to arrange testing (such as labs, EKG, etc.), we will make arrangements to do so. Although advances in technology are sophisticated, we cannot ensure that it will always work on either your end or our end. If the connection with a video visit is poor, the visit may have to be switched to a telephone visit. With either a video or telephone visit, we are not always able to ensure that we have a secure connection.  By engaging in this virtual visit, you consent to the provision of healthcare and authorize for your insurance to be billed (if applicable) for the services provided during this visit. Depending on your insurance coverage, you may receive a charge related to this service.  I need to obtain your verbal consent now. Are you willing to proceed with your visit today? Diane Day has provided verbal consent on 05/01/2023 for a virtual visit (video or telephone). Freddy Finner, NP  Date: 05/01/2023 9:25 AM  Virtual Visit via Video Note   I, Freddy Finner, connected with  Diane Day  ([id number], 09-17-1950) on 05/01/23 at  9:15 AM EST by a video-enabled telemedicine application and verified that I am speaking with the correct person using two identifiers.  Location: Patient: Virtual Visit  Location Patient: Home Provider: Virtual Visit Location Provider: Home Office   I discussed the limitations of evaluation and management by telemedicine and the availability of in person appointments. The patient expressed understanding and agreed to proceed.    History of Present Illness: Diane Day is a 72 y.o. who identifies as a female who was assigned female at birth, and is being seen today for sinus congestion  Onset was last week, and it has escalated- cold like with cough and runny nose  Associated symptoms are feeling poorly, low energy, congestion, drainage in throat, coughing, fever 100, facial pain, teeth hurt, sinus pressure headache Modifying factors are Flonase and Tylenol, compresses   Denies chest pain, shortness of breath, sore throat or ear pain  Problems:  Patient Active Problem List   Diagnosis Date Noted   Flu vaccine need 01/24/2023   Need for varicella vaccine 01/24/2023   Need for prophylactic vaccination with combined diphtheria-tetanus-pertussis (DTP) vaccine 01/24/2023   At high risk for breast cancer 06/22/2022   Encounter for general adult medical examination with abnormal findings 05/01/2020   Achilles tendonitis, bilateral 04/23/2019   Osteoporosis without current pathological fracture 03/23/2016   Family history of breast cancer 01/01/2014   Snoring 09/19/2013   Pure hypercholesterolemia 12/02/2011   Essential hypertension 09/19/2008    Allergies: No Known Allergies Medications:  Current Outpatient Medications:    calcium carbonate (TUMS - DOSED IN MG ELEMENTAL CALCIUM) 500 MG chewable tablet, Chew 1 tablet by mouth as needed., Disp: , Rfl:  Cholecalciferol (VITAMIN D3) 2000 UNITS TABS, Take by mouth every morning., Disp: , Rfl:    indapamide (LOZOL) 1.25 MG tablet, TAKE ONE TABLET BY MOUTH DAILY, Disp: 90 tablet, Rfl: 1   Multiple Vitamin (MULTIVITAMIN) tablet, Take 1 tablet by mouth daily., Disp: , Rfl:    psyllium (METAMUCIL) 58.6 %  powder, Take 1 packet by mouth daily. (Patient not taking: Reported on 03/09/2023), Disp: , Rfl:    telmisartan (MICARDIS) 40 MG tablet, Take 1 tablet (40 mg total) by mouth daily., Disp: 90 tablet, Rfl: 1  Observations/Objective: Patient is well-developed, well-nourished in no acute distress.  Resting comfortably  at home.  Head is normocephalic, atraumatic.  No labored breathing.  Speech is clear and coherent with logical content.  Patient is alert and oriented at baseline.  Cough, throat clearing Congestion tone  Assessment and Plan:   1. Acute bacterial sinusitis (Primary)  - doxycycline (VIBRA-TABS) 100 MG tablet; Take 1 tablet (100 mg total) by mouth 2 (two) times daily for 7 days.  Dispense: 14 tablet; Refill: 0 - guaiFENesin (MUCINEX) 600 MG 12 hr tablet; Take 1 tablet (600 mg total) by mouth 2 (two) times daily for 7 days.  Dispense: 14 tablet; Refill: 0  -Take meds as prescribed -Rest -Use a cool mist humidifier especially during the winter months when heat dries out the air. - Use saline nose sprays frequently to help soothe nasal passages and promote drainage. -Saline irrigations of the nose can be very helpful if done frequently.             * 4X daily for 1 week*             * Use of a nettie pot can be helpful with this.  *Follow directions with this* *Boiled or distilled water only -stay hydrated by drinking plenty of fluids - Keep thermostat turn down low to prevent drying out sinuses  - For fever or aches or pains- take tylenol or ibuprofen as directed on bottle             * for fevers greater than 101 orally you may alternate ibuprofen and tylenol every 3 hours.  If you do not improve you will need a follow up visit in person.               Reviewed side effects, risks and benefits of medication.    Patient acknowledged agreement and understanding of the plan.   Past Medical, Surgical, Social History, Allergies, and Medications have been  Reviewed.    Follow Up Instructions: I discussed the assessment and treatment plan with the patient. The patient was provided an opportunity to ask questions and all were answered. The patient agreed with the plan and demonstrated an understanding of the instructions.  A copy of instructions were sent to the patient via MyChart unless otherwise noted below.    The patient was advised to call back or seek an in-person evaluation if the symptoms worsen or if the condition fails to improve as anticipated.    Freddy Finner, NP

## 2023-05-01 NOTE — Patient Instructions (Addendum)
Diane Day, thank you for joining Freddy Finner, NP for today's virtual visit.  While this provider is not your primary care provider (PCP), if your PCP is located in our provider database this encounter information will be shared with them immediately following your visit.   A [facility] MyChart account gives you access to today's visit and all your visits, tests, and labs performed at [facility] " click here if you don't have a [facility] MyChart account or go to [email or web address]  Consent: (Patient) Diane Day provided verbal consent for this virtual visit at the beginning of the encounter.  Current Medications:  Current Outpatient Medications:    doxycycline (VIBRA-TABS) 100 MG tablet, Take 1 tablet (100 mg total) by mouth 2 (two) times daily for 7 days., Disp: 14 tablet, Rfl: 0   guaiFENesin (MUCINEX) 600 MG 12 hr tablet, Take 1 tablet (600 mg total) by mouth 2 (two) times daily for 7 days., Disp: 14 tablet, Rfl: 0   calcium carbonate (TUMS - DOSED IN MG ELEMENTAL CALCIUM) 500 MG chewable tablet, Chew 1 tablet by mouth as needed., Disp: , Rfl:    Cholecalciferol (VITAMIN D3) 2000 UNITS TABS, Take by mouth every morning., Disp: , Rfl:    indapamide (LOZOL) 1.25 MG tablet, TAKE ONE TABLET BY MOUTH DAILY, Disp: 90 tablet, Rfl: 1   Multiple Vitamin (MULTIVITAMIN) tablet, Take 1 tablet by mouth daily., Disp: , Rfl:    psyllium (METAMUCIL) 58.6 % powder, Take 1 packet by mouth daily. (Patient not taking: Reported on 03/09/2023), Disp: , Rfl:    telmisartan (MICARDIS) 40 MG tablet, Take 1 tablet (40 mg total) by mouth daily., Disp: 90 tablet, Rfl: 1   Medications ordered in this encounter:  Meds ordered this encounter  Medications   doxycycline (VIBRA-TABS) 100 MG tablet    Sig: Take 1 tablet (100 mg total) by mouth 2 (two) times daily for 7 days.    Dispense:  14 tablet    Refill:  0    Supervising Provider:   Merrilee Jansky [[id number]]    guaiFENesin (MUCINEX) 600 MG 12 hr tablet    Sig: Take 1 tablet (600 mg total) by mouth 2 (two) times daily for 7 days.    Dispense:  14 tablet    Refill:  0    Supervising Provider:   Merrilee Jansky [[id number]]     *If you need refills on other medications prior to your next appointment, please contact your pharmacy*  Follow-Up: Call back or seek an in-person evaluation if the symptoms worsen or if the condition fails to improve as anticipated.  [facility] [phone]  Other Instructions   -Take meds as prescribed -Rest -Use a cool mist humidifier especially during the winter months when heat dries out the air. - Use saline nose sprays frequently to help soothe nasal passages and promote drainage. -Saline irrigations of the nose can be very helpful if done frequently.             * 4X daily for 1 week*             * Use of a nettie pot can be helpful with this.  *Follow directions with this* *Boiled or distilled water only -stay hydrated by drinking plenty of fluids - Keep thermostat turn down low to prevent drying out sinuses  - For fever or aches or pains- take tylenol or ibuprofen as directed on bottle             *  for fevers greater than 101 orally you may alternate ibuprofen and tylenol every 3 hours.  If you do not improve you will need a follow up visit in person.                 If you have been instructed to have an in-person evaluation today at a local Urgent Care facility, please use the link below. It will take you to a list of all of our available [facility], including address, phone number and hours of operation. Please do not delay care.  [facility]  If you or a family member do not have a primary care provider, use the link below to schedule a visit and establish care. When you choose a [facility] primary care physician or advanced practice provider, you gain a long-term partner in health. Find a Primary Care  Provider  Learn more about [facility] in-office and virtual care options:  - Get Care Now

## 2023-06-30 DIAGNOSIS — H2513 Age-related nuclear cataract, bilateral: Secondary | ICD-10-CM | POA: Diagnosis not present

## 2023-06-30 DIAGNOSIS — H5213 Myopia, bilateral: Secondary | ICD-10-CM | POA: Diagnosis not present

## 2023-06-30 DIAGNOSIS — H35371 Puckering of macula, right eye: Secondary | ICD-10-CM | POA: Diagnosis not present

## 2023-07-11 ENCOUNTER — Other Ambulatory Visit: Payer: Self-pay

## 2023-07-12 NOTE — Progress Notes (Signed)
 73 y.o. [name] Married Caucasian female here for a breast and pelvic exam.    The patient is also followed for increased risk of breast cancer and osteoporosis. Lifetime risk of breast cancer 31 - 37%. [name] from medical oncology.  She completed a several year course of Evista for osteoporosis and breast cancer risk reduction.  No further treatment.  She does periodic breast MRI.  She has declined genetic testing.   Now taking Prolia through PCP.   Due again next week.  Doing weight bearing exercise.   4 grandchildren.  PCP: Diane Grandchild, MD   [name] was 05/16/2006.           Sexually active: Yes.    [name]    Menopausal hormone therapy:  n/a Exercising: Yes.     Swimming, lifting Smoker:  former  OB History     Gravida  3   Para  3   Term  3   Preterm      AB      Living  3      SAB      IAB      Ectopic      Multiple      Live Births              HEALTH MAINTENANCE: Last 2 paps: 04-24-20 Neg, 02/24/16 Neg:Neg HR HPV,   History of abnormal Pap or positive HPV:  yes,  11/30/12 ASCUS:Neg HR HPV  Mammogram:  04/06/23 Breast Density Category C, BI-RADS CATEGORY 1 Neg  Colonoscopy:  03/29/23 - due in 3 years per patient.  Bone Density:  06/21/22  Result  osteoporosis   Immunization History  Administered Date(s) Administered   Fluad Quad(high Dose 65+) 02/07/2019   Fluad Trivalent(High Dose 65+) 01/24/2023   Influenza,inj,Quad PF,6+ Mos 03/22/2016, 03/29/2017, 04/09/2018   Influenza-Unspecified 03/24/2020, 03/10/2021, 01/26/2022   PFIZER(Purple Top)SARS-COV-2 Vaccination 06/04/2019, 06/25/2019   Pfizer Covid-19 Vaccine Bivalent Booster 38yrs & up 01/29/2022   Pfizer(Comirnaty)Fall Seasonal Vaccine 12 years and older 02/13/2023   Pneumococcal Conjugate-13 04/23/2019   Pneumococcal Polysaccharide-23 04/30/2020   Td 12/02/2002   Tdap 11/30/2012    Unspecified SARS-COV-2 Vaccination 02/24/2020, 01/18/2021      reports that she quit smoking about 48 years ago. Her smoking use included cigarettes. She started smoking about 53 years ago. She has a 2.5 pack-year smoking history. She has never used smokeless tobacco. [name] [name], [name]; [name], Patient Position: Sitting, Cuff Size: Small)   Pulse 80   Ht 5\' 7"  (1.702 m)   Wt 128 lb (58.1 kg)   LMP 05/16/2006   SpO2 100%   BMI 20.05 kg/m     General appearance: alert, cooperative and appears stated age Head: normocephalic, without obvious abnormality, atraumatic Neck: no adenopathy, supple, symmetrical, trachea midline and thyroid normal to inspection and palpation Lungs: clear to auscultation bilaterally Breasts: normal appearance, no masses or tenderness, No nipple retraction or dimpling, No nipple discharge or bleeding, No axillary adenopathy Heart: regular rate and rhythm Abdomen: soft, non-tender; no masses, no organomegaly Extremities: extremities normal, atraumatic, no cyanosis or edema Skin: skin color, texture, turgor normal. No rashes or lesions Lymph nodes: cervical, supraclavicular, and axillary nodes normal. Neurologic: grossly normal  Pelvic: External genitalia:  no lesions              No abnormal inguinal nodes palpated.              Urethra:  normal appearing urethra with no masses, tenderness or lesions              Bartholins and Skenes: normal                 Vagina: normal appearing vagina with normal color and discharge, no lesions              Cervix: no lesions              Pap taken:  yes Bimanual Exam:  Uterus:  normal size, contour, position, consistency, mobility, non-tender              Adnexa: no mass, fullness, tenderness              Rectal exam: Yes.  .  Confirms.              Anus:  normal sphincter tone, no lesions  Chaperone was present for exam:  Warren Lacy,  CMA  ASSESSMENT: Encounter for breast and pelvic exam.  Strong FH breast cancer.  Increased risk of breast cancer.  31 - 37%.    Declines genetic testing.  Has metal rod in her right arm.  Osteoporosis.  status post Evista.  Now on Prolia.  Cervical cancer screening.  PHQ9:  0  PLAN: Mammogram screening discussed. Self breast awareness reviewed. Breast MRI with contrast will be ordered.  Patient declines genetic testing at this time. Pap and reflex HRV collected:  Yes.   Guidelines for Calcium, Vitamin D, regular exercise program including cardiovascular and weight bearing exercise. Medication refills:  NA BMD due at [facility] in February, 2026.  Labs with PCP.  Follow up:  2 years and prn.     Additional counseling given.  Yes.  . 20  total time was spent for this patient encounter, including preparation, face-to-face counseling with the patient, coordination of care, and documentation of the encounter in addition to doing the breast and pelvic exam and pap today.

## 2023-07-13 ENCOUNTER — Other Ambulatory Visit: Payer: Self-pay

## 2023-07-14 ENCOUNTER — Other Ambulatory Visit: Payer: Self-pay

## 2023-07-17 ENCOUNTER — Other Ambulatory Visit: Payer: Self-pay

## 2023-07-17 ENCOUNTER — Other Ambulatory Visit (HOSPITAL_COMMUNITY): Payer: Self-pay

## 2023-07-17 NOTE — Progress Notes (Signed)
 Specialty Pharmacy Refill Coordination Note  Diane Day is a 73 y.o. female contacted today regarding refills of specialty medication(s) Denosumab (PROLIA)   Patient requested Courier to Provider Office   Delivery date: 08/01/23   Verified address: No data recorded  Medication will be filled on 07/31/23.

## 2023-07-24 ENCOUNTER — Encounter: Payer: Self-pay | Admitting: Internal Medicine

## 2023-07-24 ENCOUNTER — Ambulatory Visit (INDEPENDENT_AMBULATORY_CARE_PROVIDER_SITE_OTHER)

## 2023-07-24 ENCOUNTER — Ambulatory Visit (INDEPENDENT_AMBULATORY_CARE_PROVIDER_SITE_OTHER): Payer: Medicare HMO | Admitting: Internal Medicine

## 2023-07-24 VITALS — BP 116/82 | HR 78 | Temp 97.5°F | Resp 16 | Ht 66.5 in | Wt 128.8 lb

## 2023-07-24 DIAGNOSIS — M810 Age-related osteoporosis without current pathological fracture: Secondary | ICD-10-CM | POA: Diagnosis not present

## 2023-07-24 DIAGNOSIS — M7989 Other specified soft tissue disorders: Secondary | ICD-10-CM

## 2023-07-24 DIAGNOSIS — E7800 Pure hypercholesterolemia, unspecified: Secondary | ICD-10-CM | POA: Diagnosis not present

## 2023-07-24 DIAGNOSIS — I10 Essential (primary) hypertension: Secondary | ICD-10-CM

## 2023-07-24 DIAGNOSIS — Z0001 Encounter for general adult medical examination with abnormal findings: Secondary | ICD-10-CM | POA: Diagnosis not present

## 2023-07-24 DIAGNOSIS — M79644 Pain in right finger(s): Secondary | ICD-10-CM | POA: Diagnosis not present

## 2023-07-24 DIAGNOSIS — Z23 Encounter for immunization: Secondary | ICD-10-CM

## 2023-07-24 LAB — CBC WITH DIFFERENTIAL/PLATELET
Basophils Absolute: 0.1 10*3/uL (ref 0.0–0.1)
Basophils Relative: 1.1 % (ref 0.0–3.0)
Eosinophils Absolute: 0 10*3/uL (ref 0.0–0.7)
Eosinophils Relative: 0.5 % (ref 0.0–5.0)
HCT: 41.4 % (ref 36.0–46.0)
Hemoglobin: 13.8 g/dL (ref 12.0–15.0)
Lymphocytes Relative: 40.8 % (ref 12.0–46.0)
Lymphs Abs: 2.8 10*3/uL (ref 0.7–4.0)
MCHC: 33.3 g/dL (ref 30.0–36.0)
MCV: 92.2 fl (ref 78.0–100.0)
Monocytes Absolute: 0.6 10*3/uL (ref 0.1–1.0)
Monocytes Relative: 8.2 % (ref 3.0–12.0)
Neutro Abs: 3.4 10*3/uL (ref 1.4–7.7)
Neutrophils Relative %: 49.4 % (ref 43.0–77.0)
Platelets: 394 10*3/uL (ref 150.0–400.0)
RBC: 4.49 Mil/uL (ref 3.87–5.11)
RDW: 14.1 % (ref 11.5–15.5)
WBC: 6.8 10*3/uL (ref 4.0–10.5)

## 2023-07-24 LAB — BASIC METABOLIC PANEL
BUN: 10 mg/dL (ref 6–23)
CO2: 29 meq/L (ref 19–32)
Calcium: 9.9 mg/dL (ref 8.4–10.5)
Chloride: 103 meq/L (ref 96–112)
Creatinine, Ser: 0.74 mg/dL (ref 0.40–1.20)
GFR: 82.06 mL/min (ref >60.00)
Glucose, Bld: 96 mg/dL (ref 70–99)
Potassium: 3.5 meq/L (ref 3.5–5.1)
Sodium: 141 meq/L (ref 135–145)

## 2023-07-24 LAB — TSH: TSH: 0.79 u[IU]/mL (ref 0.35–5.50)

## 2023-07-24 LAB — HEPATIC FUNCTION PANEL
ALT: 15 U/L (ref 0–35)
AST: 18 U/L (ref 0–37)
Albumin: 4.9 g/dL (ref 3.5–5.2)
Alkaline Phosphatase: 62 U/L (ref 39–117)
Bilirubin, Direct: 0.1 mg/dL (ref 0.0–0.3)
Total Bilirubin: 0.7 mg/dL (ref 0.2–1.2)
Total Protein: 7.3 g/dL (ref 6.0–8.3)

## 2023-07-24 LAB — VITAMIN D 25 HYDROXY (VIT D DEFICIENCY, FRACTURES): VITD: 60.22 ng/mL (ref 30.00–100.00)

## 2023-07-24 LAB — C-REACTIVE PROTEIN: CRP: <1 mg/dL (ref 0.5–20.0)

## 2023-07-24 LAB — PHOSPHORUS: Phosphorus: 3 mg/dL (ref 2.3–4.6)

## 2023-07-24 LAB — URIC ACID: Uric Acid, Serum: 4.1 mg/dL (ref 2.4–7.0)

## 2023-07-24 MED ORDER — BOOSTRIX 5-2.5-18.5 LF-MCG/0.5 IM SUSP
0.5000 mL | Freq: Once | INTRAMUSCULAR | 0 refills | Status: AC
  Filled 2023-07-24: qty 0.5, 1d supply, fill #0

## 2023-07-24 NOTE — Patient Instructions (Signed)

## 2023-07-24 NOTE — Progress Notes (Unsigned)
 Subjective:  Patient ID: Diane Day, female    DOB: Dec 08, 1950  Age: 73 y.o. MRN: [id number]  CC: Annual Exam and Hypertension   HPI Diane Day presents for a CPX and f/up ----  Discussed the use of AI scribe software for clinical note transcription with the patient, who gave verbal consent to proceed.  History of Present Illness   The patient presents with dizziness on exertion.  She experiences dizziness specifically during exertion, such as lifting activities, but not during walking, hiking, or swimming. She frequently swims and walks without dizziness or shortness of breath. No chest pain, shortness of breath, or swelling in her legs or feet. Normal bowel movements.  She manages her blood pressure with indapamide every other day, noting previous high readings. Home blood pressure readings are generally around 130-131/78-80 mmHg, sometimes higher. No correlation between indapamide intake and dizziness. No swelling in her legs or feet.  She describes a recent issue with her right middle finger, experiencing an inability to extend it, with thickening and itchiness since the fall. No specific injury to the finger is recalled.  She received her first shingles vaccine four months ago and is scheduled for her second dose tomorrow. She does not recall receiving a tetanus shot at that time.  She is currently receiving Prolia injections and plans to schedule her next appointment after the 16th of the month.       Outpatient Medications Prior to Visit  Medication Sig Dispense Refill   calcium carbonate (TUMS - DOSED IN MG ELEMENTAL CALCIUM) 500 MG chewable tablet Chew 1 tablet by mouth as needed.     Cholecalciferol (VITAMIN D3) 2000 UNITS TABS Take by mouth every morning.     denosumab (PROLIA) 60 MG/ML SOSY injection Inject 60 mg into the skin once for 1 dose. 1 mL 1   Multiple Vitamin (MULTIVITAMIN) tablet Take 1 tablet by mouth daily.     psyllium (METAMUCIL) 58.6 %  powder Take 1 packet by mouth as needed.     telmisartan (MICARDIS) 40 MG tablet Take 1 tablet (40 mg total) by mouth daily. 90 tablet 1   indapamide (LOZOL) 1.25 MG tablet TAKE ONE TABLET BY MOUTH DAILY 90 tablet 1   No facility-administered medications prior to visit.    ROS Review of Systems  Objective:  BP 116/82 (BP Location: Left Arm, Patient Position: Sitting, Cuff Size: Normal)   Pulse 78   Temp (!) 97.5 F (36.4 C) (Oral)   Resp 16   Ht 5' 6.5" (1.689 m)   Wt 128 lb 12.8 oz (58.4 kg)   LMP 05/16/2006   SpO2 98%   BMI 20.48 kg/m   BP Readings from Last 3 Encounters:  07/24/23 116/82  03/29/23 117/72  01/24/23 122/74    Wt Readings from Last 3 Encounters:  07/24/23 128 lb 12.8 oz (58.4 kg)  03/29/23 125 lb (56.7 kg)  03/09/23 125 lb (56.7 kg)    Physical Exam Vitals reviewed.  Constitutional:      Appearance: Normal appearance.  HENT:     Nose: Nose normal.     Mouth/Throat:     Mouth: Mucous membranes are moist.  Eyes:     General: No scleral icterus.    Conjunctiva/sclera: Conjunctivae normal.  Cardiovascular:     Rate and Rhythm: Normal rate and regular rhythm.     Heart sounds: No murmur heard.    No friction rub. No gallop.     Comments: EKG-- NSR, 64 bpm  No LVH, Q waves, or ST/T wave changes  Pulmonary:     Effort: Pulmonary effort is normal.     Breath sounds: No stridor. No wheezing, rhonchi or rales.  Abdominal:     General: Abdomen is flat.     Palpations: There is no mass.     Tenderness: There is no abdominal tenderness. There is no guarding.     Hernia: No hernia is present.  Musculoskeletal:        General: Normal range of motion.     Cervical back: Neck supple. No tenderness.     Right lower leg: No edema.     Left lower leg: No edema.  Lymphadenopathy:     Cervical: No cervical adenopathy.  Skin:    General: Skin is warm and dry.  Neurological:     General: No focal deficit present.     Mental Status: She is alert and  oriented to person, place, and time. Mental status is at baseline.  Psychiatric:        Mood and Affect: Mood normal.        Behavior: Behavior normal.        Thought Content: Thought content normal.        Judgment: Judgment normal.     Lab Results  Component Value Date   WBC 6.8 07/24/2023   HGB 13.8 07/24/2023   HCT 41.4 07/24/2023   PLT 394.0 07/24/2023   GLUCOSE 96 07/24/2023   CHOL 184 01/24/2023   TRIG 51.0 01/24/2023   HDL 95.00 01/24/2023   LDLCALC 78 01/24/2023   ALT 15 07/24/2023   AST 18 07/24/2023   NA 141 07/24/2023   K 3.5 07/24/2023   CL 103 07/24/2023   CREATININE 0.74 07/24/2023   BUN 10 07/24/2023   CO2 29 07/24/2023   TSH 0.79 07/24/2023   INR 1.1 (H) 04/23/2019   HGBA1C 5.6 11/20/2014    No results found.  Assessment & Plan:  Essential hypertension -     Basic metabolic panel; Future -     Hepatic function panel; Future -     TSH; Future -     CBC with Differential/Platelet; Future -     EKG 12-Lead  Pure hypercholesterolemia -     Hepatic function panel; Future -     TSH; Future  Encounter for general adult medical examination with abnormal findings  Swelling of right middle finger -     DG Finger Middle Right; Future -     C-reactive protein; Future -     Uric acid; Future -     Ambulatory referral to Orthopedic Surgery  Age-related osteoporosis without current pathological fracture -     Phosphorus; Future -     VITAMIN D 25 Hydroxy (Vit-D Deficiency, Fractures); Future  Need for prophylactic vaccination with combined diphtheria-tetanus-pertussis (DTP) vaccine -     Boostrix; Inject 0.5 mLs into the muscle once for 1 dose.  Dispense: 0.5 mL; Refill: 0     Follow-up: Return in about 6 months (around 01/24/2024).  Sanda Linger, MD

## 2023-07-25 ENCOUNTER — Other Ambulatory Visit (HOSPITAL_COMMUNITY): Payer: Self-pay

## 2023-07-26 ENCOUNTER — Telehealth: Payer: Self-pay | Admitting: Obstetrics and Gynecology

## 2023-07-26 ENCOUNTER — Ambulatory Visit (INDEPENDENT_AMBULATORY_CARE_PROVIDER_SITE_OTHER): Payer: Medicare HMO | Admitting: Obstetrics and Gynecology

## 2023-07-26 ENCOUNTER — Other Ambulatory Visit (HOSPITAL_COMMUNITY)
Admission: RE | Admit: 2023-07-26 | Discharge: 2023-07-26 | Disposition: A | Discharge status: Home / Self Care | Source: Ambulatory Visit | Attending: Obstetrics and Gynecology | Admitting: Obstetrics and Gynecology

## 2023-07-26 ENCOUNTER — Encounter: Payer: Self-pay | Admitting: Obstetrics and Gynecology

## 2023-07-26 VITALS — BP 126/72 | HR 80 | Ht 67.0 in | Wt 128.0 lb

## 2023-07-26 DIAGNOSIS — M810 Age-related osteoporosis without current pathological fracture: Secondary | ICD-10-CM

## 2023-07-26 DIAGNOSIS — Z01419 Encounter for gynecological examination (general) (routine) without abnormal findings: Secondary | ICD-10-CM | POA: Diagnosis not present

## 2023-07-26 DIAGNOSIS — Z9189 Other specified personal risk factors, not elsewhere classified: Secondary | ICD-10-CM | POA: Diagnosis not present

## 2023-07-26 DIAGNOSIS — Z1331 Encounter for screening for depression: Secondary | ICD-10-CM

## 2023-07-26 DIAGNOSIS — Z124 Encounter for screening for malignant neoplasm of cervix: Secondary | ICD-10-CM | POA: Diagnosis not present

## 2023-07-26 NOTE — Telephone Encounter (Signed)
 Please assist in scheduling a breast MRI with contrast for my patient.   She has an increased lifetime risk of breast cancer at 51 - 37%.

## 2023-07-26 NOTE — Patient Instructions (Signed)

## 2023-07-26 NOTE — Telephone Encounter (Signed)
 Breast MRI order placed at [facility].   Call placed to patient, left detailed message, ok per dpr. Advised order has been placed, you will need to call directly to schedule at [phone]. Advised of PA and benefits process. Return call to office if assistance is needed or any additional questions.

## 2023-07-27 ENCOUNTER — Telehealth

## 2023-07-27 DIAGNOSIS — B9689 Other specified bacterial agents as the cause of diseases classified elsewhere: Secondary | ICD-10-CM | POA: Diagnosis not present

## 2023-07-27 DIAGNOSIS — J019 Acute sinusitis, unspecified: Secondary | ICD-10-CM | POA: Diagnosis not present

## 2023-07-27 MED ORDER — AZITHROMYCIN 250 MG PO TABS: ORAL_TABLET | ORAL | 0 refills | Status: AC

## 2023-07-27 NOTE — Progress Notes (Signed)
 Virtual Visit Consent   Diane Day, you are scheduled for a virtual visit with a [facility] provider today. Just as with appointments in the office, your consent must be obtained to participate. Your consent will be active for this visit and any virtual visit you may have with one of our providers in the next 365 days. If you have a MyChart account, a copy of this consent can be sent to you electronically.  As this is a virtual visit, video technology does not allow for your provider to perform a traditional examination. This may limit your provider's ability to fully assess your condition. If your provider identifies any concerns that need to be evaluated in person or the need to arrange testing (such as labs, EKG, etc.), we will make arrangements to do so. Although advances in technology are sophisticated, we cannot ensure that it will always work on either your end or our end. If the connection with a video visit is poor, the visit may have to be switched to a telephone visit. With either a video or telephone visit, we are not always able to ensure that we have a secure connection.  By engaging in this virtual visit, you consent to the provision of healthcare and authorize for your insurance to be billed (if applicable) for the services provided during this visit. Depending on your insurance coverage, you may receive a charge related to this service.  I need to obtain your verbal consent now. Are you willing to proceed with your visit today? Diane Day has provided verbal consent on 07/27/2023 for a virtual visit (video or telephone). Freddy Finner, NP  Date: 07/27/2023 9:02 AM   Virtual Visit via Video Note   I, Freddy Finner, connected with  Diane Day  ([id number], 01/25/1951) on 07/27/23 at  9:00 AM EDT by a video-enabled telemedicine application and verified that I am speaking with the correct person using two identifiers.  Location: Patient: Virtual Visit  Location Patient: Home Provider: Virtual Visit Location Provider: Home Office   I discussed the limitations of evaluation and management by telemedicine and the availability of in person appointments. The patient expressed understanding and agreed to proceed.    History of Present Illness: Diane Day is a 73 y.o. who identifies as a female who was assigned female at birth, and is being seen today for sinus congestion  Onset was 10 days ago had a URI that seemed to improve and then in last 24-48 hours has dramatically increased in symptoms again- with cough, drainage and congestion.  Associated symptoms are none aside above  Modifying factors are OTC cold meds- mucinex, flonase, and cough meds  Denies chest pain, shortness of breath, fevers, chills  Exposure to sick contacts- unknown   Problems:  Patient Active Problem List   Diagnosis Date Noted   Swelling of right middle finger 07/24/2023   Flu vaccine need 01/24/2023   Need for varicella vaccine 01/24/2023   Need for prophylactic vaccination with combined diphtheria-tetanus-pertussis (DTP) vaccine 01/24/2023   At high risk for breast cancer 06/22/2022   Encounter for general adult medical examination with abnormal findings 05/01/2020   Achilles tendonitis, bilateral 04/23/2019   Osteoporosis without current pathological fracture 03/23/2016   Family history of breast cancer 01/01/2014   Snoring 09/19/2013   Pure hypercholesterolemia 12/02/2011   Essential hypertension 09/19/2008    Allergies: No Known Allergies Medications:  Current Outpatient Medications:    calcium carbonate (TUMS - DOSED IN MG  ELEMENTAL CALCIUM) 500 MG chewable tablet, Chew 1 tablet by mouth as needed., Disp: , Rfl:    Cholecalciferol (VITAMIN D3) 2000 UNITS TABS, Take by mouth every morning., Disp: , Rfl:    denosumab (PROLIA) 60 MG/ML SOSY injection, Inject 60 mg into the skin once for 1 dose., Disp: 1 mL, Rfl: 1   Multiple Vitamin (MULTIVITAMIN)  tablet, Take 1 tablet by mouth daily., Disp: , Rfl:    psyllium (METAMUCIL) 58.6 % powder, Take 1 packet by mouth as needed., Disp: , Rfl:    telmisartan (MICARDIS) 40 MG tablet, Take 1 tablet (40 mg total) by mouth daily., Disp: 90 tablet, Rfl: 1  Observations/Objective: Patient is well-developed, well-nourished in no acute distress.  Resting comfortably  at home.  Head is normocephalic, atraumatic.  No labored breathing.  Speech is clear and coherent with logical content.  Patient is alert and oriented at baseline.  Congestion tone note, cough present   Assessment and Plan:  1. Acute bacterial sinusitis (Primary)  - azithromycin (ZITHROMAX) 250 MG tablet; Take 2 tablets on day 1, then 1 tablet daily on days 2 through 5  Dispense: 6 tablet; Refill: 0   -Take meds as discussed  - Increased rest - Increasing Fluids - Acetaminophen / ibuprofen as needed for fever/pain.  - Salt water gargling, chloraseptic spray and throat lozenges - Mucinex if mucus is present and increasing.  - Saline nasal spray if congestion or if nasal passages feel dry. - Humidifying the air.   Reviewed side effects, risks and benefits of medication.    Patient acknowledged agreement and understanding of the plan.   Past Medical, Surgical, Social History, Allergies, and Medications have been Reviewed.     Follow Up Instructions: I discussed the assessment and treatment plan with the patient. The patient was provided an opportunity to ask questions and all were answered. The patient agreed with the plan and demonstrated an understanding of the instructions.  A copy of instructions were sent to the patient via MyChart unless otherwise noted below.   The patient was advised to call back or seek an in-person evaluation if the symptoms worsen or if the condition fails to improve as anticipated.    Freddy Finner, NP

## 2023-07-27 NOTE — Patient Instructions (Signed)
  Diane Day, thank you for joining Freddy Finner, NP for today's virtual visit.  While this provider is not your primary care provider (PCP), if your PCP is located in our provider database this encounter information will be shared with them immediately following your visit.   A [facility] MyChart account gives you access to today's visit and all your visits, tests, and labs performed at [facility] " click here if you don't have a [facility] MyChart account or go to [email or web address]  Consent: (Patient) Diane Day provided verbal consent for this virtual visit at the beginning of the encounter.  Current Medications:  Current Outpatient Medications:    azithromycin (ZITHROMAX) 250 MG tablet, Take 2 tablets on day 1, then 1 tablet daily on days 2 through 5, Disp: 6 tablet, Rfl: 0   calcium carbonate (TUMS - DOSED IN MG ELEMENTAL CALCIUM) 500 MG chewable tablet, Chew 1 tablet by mouth as needed., Disp: , Rfl:    Cholecalciferol (VITAMIN D3) 2000 UNITS TABS, Take by mouth every morning., Disp: , Rfl:    denosumab (PROLIA) 60 MG/ML SOSY injection, Inject 60 mg into the skin once for 1 dose., Disp: 1 mL, Rfl: 1   Multiple Vitamin (MULTIVITAMIN) tablet, Take 1 tablet by mouth daily., Disp: , Rfl:    psyllium (METAMUCIL) 58.6 % powder, Take 1 packet by mouth as needed., Disp: , Rfl:    telmisartan (MICARDIS) 40 MG tablet, Take 1 tablet (40 mg total) by mouth daily., Disp: 90 tablet, Rfl: 1   Medications ordered in this encounter:  Meds ordered this encounter  Medications   azithromycin (ZITHROMAX) 250 MG tablet    Sig: Take 2 tablets on day 1, then 1 tablet daily on days 2 through 5    Dispense:  6 tablet    Refill:  0    Supervising Provider:   Merrilee Jansky [phone]     *If you need refills on other medications prior to your next appointment, please contact your pharmacy*  Follow-Up: Call back or seek an in-person evaluation if the symptoms  worsen or if the condition fails to improve as anticipated.  [facility] [phone]  Other Instructions   -Take meds as discussed  - Increased rest - Increasing Fluids - Acetaminophen / ibuprofen as needed for fever/pain.  - Salt water gargling, chloraseptic spray and throat lozenges - Mucinex if mucus is present and increasing.  - Saline nasal spray if congestion or if nasal passages feel dry. - Humidifying the air.   If you have been instructed to have an in-person evaluation today at a local Urgent Care facility, please use the link below. It will take you to a list of all of our available [facility], including address, phone number and hours of operation. Please do not delay care.  [facility]  If you or a family member do not have a primary care provider, use the link below to schedule a visit and establish care. When you choose a [facility] primary care physician or advanced practice provider, you gain a long-term partner in health. Find a Primary Care Provider  Learn more about [facility]'s in-office and virtual care options: [facility] - Get Care Now

## 2023-07-28 DIAGNOSIS — L814 Other melanin hyperpigmentation: Secondary | ICD-10-CM | POA: Diagnosis not present

## 2023-07-28 DIAGNOSIS — D2271 Melanocytic nevi of right lower limb, including hip: Secondary | ICD-10-CM | POA: Diagnosis not present

## 2023-07-28 DIAGNOSIS — D2372 Other benign neoplasm of skin of left lower limb, including hip: Secondary | ICD-10-CM | POA: Diagnosis not present

## 2023-07-28 DIAGNOSIS — D225 Melanocytic nevi of trunk: Secondary | ICD-10-CM | POA: Diagnosis not present

## 2023-07-28 DIAGNOSIS — L570 Actinic keratosis: Secondary | ICD-10-CM | POA: Diagnosis not present

## 2023-07-28 DIAGNOSIS — L821 Other seborrheic keratosis: Secondary | ICD-10-CM | POA: Diagnosis not present

## 2023-07-28 DIAGNOSIS — D1801 Hemangioma of skin and subcutaneous tissue: Secondary | ICD-10-CM | POA: Diagnosis not present

## 2023-07-28 DIAGNOSIS — L918 Other hypertrophic disorders of the skin: Secondary | ICD-10-CM | POA: Diagnosis not present

## 2023-07-28 DIAGNOSIS — Z85828 Personal history of other malignant neoplasm of skin: Secondary | ICD-10-CM | POA: Diagnosis not present

## 2023-07-28 LAB — CYTOLOGY - PAP: Diagnosis: NEGATIVE

## 2023-07-31 ENCOUNTER — Encounter: Payer: Self-pay | Admitting: Obstetrics and Gynecology

## 2023-07-31 ENCOUNTER — Other Ambulatory Visit: Payer: Self-pay

## 2023-07-31 NOTE — Progress Notes (Signed)
 Attempted to contact patient. LVM

## 2023-07-31 NOTE — Telephone Encounter (Signed)
 Per review of EPIC, MRI scheduled for 08/27/23.   Encounter closed.

## 2023-08-01 ENCOUNTER — Other Ambulatory Visit: Payer: Self-pay

## 2023-08-01 ENCOUNTER — Encounter: Payer: Self-pay | Admitting: Internal Medicine

## 2023-08-02 ENCOUNTER — Telehealth: Payer: Self-pay

## 2023-08-02 ENCOUNTER — Other Ambulatory Visit: Payer: Self-pay

## 2023-08-02 ENCOUNTER — Other Ambulatory Visit (HOSPITAL_COMMUNITY): Payer: Self-pay

## 2023-08-02 ENCOUNTER — Ambulatory Visit: Payer: Self-pay | Admitting: Internal Medicine

## 2023-08-02 NOTE — Telephone Encounter (Signed)
 Have since called patient and have them scheduled for their prolia injection.

## 2023-08-02 NOTE — Telephone Encounter (Signed)
 Clinic supplied has already been noted for appointment coming up.  No further action needed.

## 2023-08-02 NOTE — Progress Notes (Addendum)
 Office may be able to do buy/bill. Reaching out to Catarina Hartshorn to check.

## 2023-08-02 NOTE — Telephone Encounter (Signed)
 Attempted to call patient and left a voice message for call back.

## 2023-08-02 NOTE — Progress Notes (Signed)
 See Specialty Medication Biv (Prolia) encounter from 3/19.  It is cheaper for patient to go through medical benefits. Out of pocket is $356 vs. [phone] with pharmacy benefits. Office is aware and advised patient to reach out to office to discuss buy/bill option further. Dis-enrolling.

## 2023-08-02 NOTE — Telephone Encounter (Signed)
 Pt ready for scheduling for Prolia on or after : 08/01/13  Option# 1: Buy/Bill (Office supplied medication)  Out-of-pocket cost due at time of clinic visit: $356  Number of injection/visits approved: --  Primary: [employer] - Medicare Prolia co-insurance: 20% (approximately $331) Admin fee co-insurance: 20% (approximately $25)  Secondary: N/A Prolia co-insurance:  Admin fee co-insurance:   Medical Benefit Details: Date Benefits were checked: 08/02/23 Deductible: no/ Coinsurance: 20%/ Admin Fee: 20%  Prior Auth: Not required PA# Expiration Date:   # of doses approved: ----------------------------------------------------------------------- Option# 2- Med Obtained from pharmacy:  Pharmacy benefit: Copay $645.79 (Paid to pharmacy) Admin Fee: 20% (Pay at clinic approximately $25)  Prior Auth: not required PA# Expiration Date:   # of doses approved:   If patient wants fill through the pharmacy benefit please send prescription to:  [facility] , and include estimated need by date in rx notes. Pharmacy will ship medication directly to the office.  Patient not eligible for Prolia Copay Card. Copay Card can make patient's cost as little as $25. Link to apply: [email or web address]  ** This summary of benefits is an estimation of the patient's out-of-pocket cost. Exact cost may very based on individual plan coverage.

## 2023-08-02 NOTE — Telephone Encounter (Signed)
 Copied from CRM [id number]. Topic: Clinical - Prescription Issue >> Aug 02, 2023 10:45 AM Florestine Avers wrote: Reason for CRM: The pharmacy called and stated that they was going to fill the patients Prolia injection, but its better for the clinic to do a buy and build. I told the pharmacy I would inform the nurse about this. The pharmacist said she would tell the patient to contact us for further assistance as well.

## 2023-08-02 NOTE — Telephone Encounter (Signed)
 Marland Kitchen

## 2023-08-04 ENCOUNTER — Ambulatory Visit

## 2023-08-04 DIAGNOSIS — M810 Age-related osteoporosis without current pathological fracture: Secondary | ICD-10-CM

## 2023-08-04 MED ORDER — DENOSUMAB 60 MG/ML ~~LOC~~ SOSY
60.0000 mg | PREFILLED_SYRINGE | Freq: Once | SUBCUTANEOUS | Status: AC
Start: 2023-08-04 — End: 2023-08-04
  Administered 2023-08-04: 60 mg via SUBCUTANEOUS

## 2023-08-04 MED ORDER — DENOSUMAB 60 MG/ML ~~LOC~~ SOSY
60.0000 mg | PREFILLED_SYRINGE | Freq: Once | SUBCUTANEOUS | Status: DC
Start: 2024-02-04 — End: 2024-01-24

## 2023-08-04 NOTE — Progress Notes (Signed)
 Prolia injection given w/o complications.

## 2023-08-20 ENCOUNTER — Other Ambulatory Visit: Payer: Self-pay | Admitting: Internal Medicine

## 2023-08-20 DIAGNOSIS — I10 Essential (primary) hypertension: Secondary | ICD-10-CM

## 2023-08-21 ENCOUNTER — Encounter: Payer: Self-pay | Admitting: Obstetrics and Gynecology

## 2023-08-27 ENCOUNTER — Other Ambulatory Visit

## 2023-09-26 ENCOUNTER — Encounter: Payer: Self-pay | Admitting: Obstetrics and Gynecology

## 2023-09-30 ENCOUNTER — Ambulatory Visit
Admission: RE | Admit: 2023-09-30 | Discharge: 2023-09-30 | Disposition: A | Discharge status: Home / Self Care | Source: Ambulatory Visit | Attending: Obstetrics and Gynecology | Admitting: Obstetrics and Gynecology

## 2023-09-30 DIAGNOSIS — Z1239 Encounter for other screening for malignant neoplasm of breast: Secondary | ICD-10-CM | POA: Diagnosis not present

## 2023-09-30 DIAGNOSIS — Z9189 Other specified personal risk factors, not elsewhere classified: Secondary | ICD-10-CM

## 2023-09-30 MED ORDER — GADOPICLENOL 0.5 MMOL/ML IV SOLN
5.0000 mL | Freq: Once | INTRAVENOUS | Status: AC | PRN
  Administered 2023-09-30: 5 mL via INTRAVENOUS

## 2023-10-02 ENCOUNTER — Ambulatory Visit: Payer: Self-pay | Admitting: Obstetrics and Gynecology

## 2023-10-03 ENCOUNTER — Ambulatory Visit: Admitting: Orthopedic Surgery

## 2023-10-03 DIAGNOSIS — M7989 Other specified soft tissue disorders: Secondary | ICD-10-CM | POA: Diagnosis not present

## 2023-10-03 NOTE — Progress Notes (Signed)
 Diane Day - 73 y.o. female MRN [id number]  Date of birth: 08-19-1950  Office Visit Note: Visit Date: 10/03/2023 PCP: Arcadio Knuckles, MD Referred by: Arcadio Knuckles, MD  Subjective: No chief complaint on file.  HPI: Diane Day is a pleasant 73 y.o. female who presents today for evaluation of right long finger tightness along the P2 region most dorsally.  She denies any significant injury, does do a fair amount of knitting, earlier this year started to develop what she feels was a flexed posture of the DIP of the long finger with inability for terminal extension.  Since that time, she states that this has corrected, she still feels a "thickened" feeling along the dorsal aspect of the long finger, particular at the middle phalanx area.  She has undergone prior workup with x-rays which were negative.  No other significant treatment has been tried.  She does also mention a family history of Dupuytren's in the family, does not have any active contractures.  Does have a history of plantar fasciitis.  Pertinent ROS were reviewed with the patient and found to be negative unless otherwise specified above in HPI.   Visit Reason: right long  Duration of symptoms: October 2024 Hand dominance: right Occupation: Physicist, medical Diabetic: No Smoking: No Heart/Lung History: High Blood Pressure Blood Thinners:  None  Prior Testing/EMG: 07/24/23 Injections (Date):none Treatments:none Prior Surgery: none  No injury; no pain Patient complaining of a "thickening" feeling between the PIP and DIP   Assessment & Plan: Visit Diagnoses:  1. Swelling of right middle finger     Plan: Extensive discussion was had with the patient today regarding her right long finger.  On today's examination, she has appropriate flexion and extension of the digit at both the PIP and DIP regions.  There may be some slight stiffness along the extensor mechanism particularly at the terminal extensor  of the DIP.  There may have been a prior soft tissue attenuation of the extensor apparatus with subsequent scar tissue formation based on her story, this may be precluding full tendon gliding of the extensor apparatus distally.  However, at this juncture, I do not see any structural abnormalities that would warrant surgical fixation.  As for the Dupuytren's contracture family history, fortunately her Dupuytren's nodules have not progressed any significant contractures at this time.  She has negative tabletop test bilaterally.  I did explain to her that these can progress with time particular given her family history, she is welcome to return to me in the future should the contracture progress for further discussion.  I did mention her potential treatment options ranging from conservative to surgical which she expressed understanding.  Follow-up: No follow-ups on file.   Meds & Orders: No orders of the defined types were placed in this encounter.  No orders of the defined types were placed in this encounter.    Procedures: No procedures performed      Clinical History: No specialty comments available.  She reports that she quit smoking about 48 years ago. Her smoking use included cigarettes. She started smoking about 53 years ago. She has a 2.5 pack-year smoking history. She has never used smokeless tobacco.  Recent Labs    07/24/23 1348  LABURIC 4.1    Objective:   Vital Signs: LMP 05/16/2006   Physical Exam  Gen: Well-appearing, in no acute distress; non-toxic CV: Regular Rate. Well-perfused. Warm.  Resp: Breathing unlabored on room air; no wheezing. Psych:  Fluid speech in conversation; appropriate affect; normal thought process  Ortho Exam Right hand: - Long finger with minimal tenderness throughout, no significant swelling or erythema - Able to form composite fist without restriction, full extension of all digits without significant extensor lag - Grip strength Jamar 2 right  45, left 40 - Sensation intact in all distributions distally median/radial/ulnar, AIN/PIN/interosseous intact - Palmar aspect of the hand does have some minimal Dupuytren's nodules, no evidence of cord or contracture, negative tabletop test  Imaging: No results found.  Past Medical/Family/Surgical/Social History: Medications & Allergies reviewed per EMR, new medications updated. Patient Active Problem List   Diagnosis Date Noted   Swelling of right middle finger 07/24/2023   Flu vaccine need 01/24/2023   Need for varicella vaccine 01/24/2023   Need for prophylactic vaccination with combined diphtheria-tetanus-pertussis (DTP) vaccine 01/24/2023   At high risk for breast cancer 06/22/2022   Encounter for general adult medical examination with abnormal findings 05/01/2020   Achilles tendonitis, bilateral 04/23/2019   Osteoporosis without current pathological fracture 03/23/2016   Family history of breast cancer 01/01/2014   Snoring 09/19/2013   Pure hypercholesterolemia 12/02/2011   Essential hypertension 09/19/2008   Past Medical History:  Diagnosis Date   Cancer (HCC)    basal cell, face   Family history of breast cancer    Hypertension    Osteopenia    Osteoporosis    Rectal bleeding    Family History  Problem Relation Age of Onset   Breast cancer Mother 57   Hypertension Mother    Osteoporosis Mother    Diabetes Father    Heart disease Father    Hypertension Father    Esophageal cancer Father 76   Breast cancer Sister 47       BRCA negative   Diabetes Sister    Breast cancer Sister 7   Breast cancer Sister    Osteoporosis Brother    Cancer Paternal Aunt        unknown form of cancer over the age of 39   Thyroid  cancer Cousin        maternal first cousin   Breast cancer Cousin        died in her 58s; paternal cousin   Breast cancer Cousin        paternal first cousin   Colon cancer Neg Hx    Stomach cancer Neg Hx    Rectal cancer Neg Hx    Colon polyps Neg  Hx    Past Surgical History:  Procedure Laterality Date   BREAST SURGERY Left 1998   remove fibroadenoma   KNEE ARTHROSCOPY Left    repair broken arm  2008   TUBAL LIGATION  1994   Social History   Occupational History    Employer: [employer]  Tobacco Use   Smoking status: Former    Current packs/day: 0.00    Average packs/day: 0.5 packs/day for 5.0 years (2.5 ttl pk-yrs)    Types: Cigarettes    Start date: 12/01/1969    Quit date: 12/02/1974    Years since quitting: 48.8   Smokeless tobacco: Never  Vaping Use   Vaping status: Never Used  Substance and Sexual Activity   Alcohol use: Yes    Alcohol/week: 5.0 standard drinks of alcohol    Types: 5 Cans of beer per week    Comment: occ glass of wine, drink of choice beer   Drug use: No   Sexual activity: Yes    Partners: Male  Birth control/protection: Post-menopausal    Comment: less than 5, first IC after 18, no STD, no abnormal pap, no DES    Tally Mckinnon Merlinda Starling) Lanise Mergen, M.D. [facility], Hand Surgery

## 2023-10-24 ENCOUNTER — Encounter: Payer: Self-pay | Admitting: Internal Medicine

## 2023-10-26 ENCOUNTER — Other Ambulatory Visit: Payer: Self-pay | Admitting: Internal Medicine

## 2023-10-26 ENCOUNTER — Encounter: Payer: Self-pay | Admitting: Internal Medicine

## 2023-10-26 DIAGNOSIS — I10 Essential (primary) hypertension: Secondary | ICD-10-CM

## 2023-10-26 MED ORDER — INDAPAMIDE 1.25 MG PO TABS: 1.2500 mg | ORAL_TABLET | Freq: Every day | ORAL | 0 refills | Status: DC

## 2023-11-20 ENCOUNTER — Encounter (HOSPITAL_COMMUNITY): Payer: Self-pay | Admitting: Emergency Medicine

## 2023-11-20 ENCOUNTER — Ambulatory Visit (HOSPITAL_COMMUNITY)
Admission: EM | Admit: 2023-11-20 | Discharge: 2023-11-20 | Disposition: A | Discharge status: Home / Self Care | Attending: Family Medicine | Admitting: Family Medicine

## 2023-11-20 ENCOUNTER — Other Ambulatory Visit: Payer: Self-pay

## 2023-11-20 DIAGNOSIS — J019 Acute sinusitis, unspecified: Secondary | ICD-10-CM | POA: Diagnosis not present

## 2023-11-20 MED ORDER — CEFDINIR 300 MG PO CAPS: 600.0000 mg | ORAL_CAPSULE | Freq: Every day | ORAL | 0 refills | Status: AC

## 2023-11-20 NOTE — ED Notes (Signed)
 Complains of sinus drainage for 10 days.  Reports feeling terrible today and is certain it is a sinus infection.  Complains of cough, especially at night.  Unknown if has had a fever.  Patient has a runny nose.  Has been using flonase

## 2023-11-20 NOTE — Discharge Instructions (Signed)
 Take cefdinir  300 mg--2 capsules together daily for 7 days  Continue to use the Flonase  You can also rinse both sides of your nose with saline and that will help some

## 2023-11-20 NOTE — ED Provider Notes (Signed)
 [facility]    CSN: [id number] Arrival date & time: 11/20/23  1009      History   Chief Complaint Chief Complaint  Patient presents with   Cough    HPI Diane Day is a 73 y.o. female.    Cough Here for nasal drainage, sinus pressure and postnasal drainage and cough.  Symptoms have been going on for about 8 days, and they are worsening.  No shortness of breath or wheezing.  No fever at any point.  NKDA.  She takes medication for hypertension.  Past Medical History:  Diagnosis Date   Cancer (HCC)    basal cell, face   Family history of breast cancer    Hypertension    Osteopenia    Osteoporosis    Rectal bleeding     Patient Active Problem List   Diagnosis Date Noted   Flu vaccine need 01/24/2023   Need for varicella vaccine 01/24/2023   Need for prophylactic vaccination with combined diphtheria-tetanus-pertussis (DTP) vaccine 01/24/2023   At high risk for breast cancer 06/22/2022   Encounter for general adult medical examination with abnormal findings 05/01/2020   Achilles tendonitis, bilateral 04/23/2019   Osteoporosis without current pathological fracture 03/23/2016   Family history of breast cancer 01/01/2014   Snoring 09/19/2013   Pure hypercholesterolemia 12/02/2011   Essential hypertension 09/19/2008    Past Surgical History:  Procedure Laterality Date   BREAST SURGERY Left 1998   remove fibroadenoma   KNEE ARTHROSCOPY Left    repair broken arm  2008   TUBAL LIGATION  1994    OB History     Gravida  3   Para  3   Term  3   Preterm      AB      Living  3      SAB      IAB      Ectopic      Multiple      Live Births               Home Medications    Prior to Admission medications   Medication Sig Start Date End Date Taking? Authorizing Provider  cefdinir  (OMNICEF ) 300 MG capsule Take 2 capsules (600 mg total) by mouth daily for 7 days. 11/20/23 11/27/23 Yes Avram Danielson K, MD  calcium  carbonate (TUMS - DOSED IN MG ELEMENTAL CALCIUM) 500 MG chewable tablet Chew 1 tablet by mouth as needed.    [provider]  Cholecalciferol (VITAMIN D3) 2000 UNITS TABS Take by mouth every morning.    [provider]  indapamide  (LOZOL ) 1.25 MG tablet Take 1 tablet (1.25 mg total) by mouth daily. 10/26/23   Joshua Debby CROME, MD  Multiple Vitamin (MULTIVITAMIN) tablet Take 1 tablet by mouth daily.    [provider]  psyllium (METAMUCIL) 58.6 % powder Take 1 packet by mouth as needed.    [provider]  telmisartan  (MICARDIS ) 40 MG tablet Take 1 tablet (40 mg total) by mouth daily. 08/21/23   Joshua Debby CROME, MD    Family History Family History  Problem Relation Age of Onset   Breast cancer Mother 74   Hypertension Mother    Osteoporosis Mother    Diabetes Father    Heart disease Father    Hypertension Father    Esophageal cancer Father 65   Breast cancer Sister 29       BRCA negative   Diabetes Sister    Breast cancer  Sister 42   Breast cancer Sister    Osteoporosis Brother    Cancer Paternal Aunt        unknown form of cancer over the age of 38   Thyroid  cancer Cousin        maternal first cousin   Breast cancer Cousin        died in her 53s; paternal cousin   Breast cancer Cousin        paternal first cousin   Colon cancer Neg Hx    Stomach cancer Neg Hx    Rectal cancer Neg Hx    Colon polyps Neg Hx     Social History Social History   Tobacco Use   Smoking status: Former    Current packs/day: 0.00    Average packs/day: 0.5 packs/day for 5.0 years (2.5 ttl pk-yrs)    Types: Cigarettes    Start date: 12/01/1969    Quit date: 12/02/1974    Years since quitting: 49.0   Smokeless tobacco: Never  Vaping Use   Vaping status: Never Used  Substance Use Topics   Alcohol use: Yes    Alcohol/week: 5.0 standard drinks of alcohol    Types: 5 Cans of beer per week    Comment: occ glass of wine, drink of choice beer   Drug use: No      Allergies   Patient has no known allergies.   Review of Systems Review of Systems  Respiratory:  Positive for cough.      Physical Exam Triage Vital Signs ED Triage Vitals  Encounter Vitals Group     BP 11/20/23 1035 124/78     Girls Systolic BP Percentile --      Girls Diastolic BP Percentile --      Boys Systolic BP Percentile --      Boys Diastolic BP Percentile --      Pulse Rate 11/20/23 1035 78     Resp 11/20/23 1035 18     Temp 11/20/23 1035 98 F (36.7 C)     Temp Source 11/20/23 1035 Oral     SpO2 11/20/23 1035 96 %     Weight --      Height --      Head Circumference --      Peak Flow --      Pain Score 11/20/23 1033 7     Pain Loc --      Pain Education --      Exclude from Growth Chart --    No data found.  Updated Vital Signs BP 124/78 (BP Location: Right Arm)   Pulse 78   Temp 98 F (36.7 C) (Oral)   Resp 18   LMP 05/16/2006   SpO2 96%   Visual Acuity Right Eye Distance:   Left Eye Distance:   Bilateral Distance:    Right Eye Near:   Left Eye Near:    Bilateral Near:     Physical Exam Vitals reviewed.  Constitutional:      General: She is not in acute distress.    Appearance: She is not ill-appearing, toxic-appearing or diaphoretic.  HENT:     Right Ear: Tympanic membrane and ear canal normal.     Left Ear: Tympanic membrane and ear canal normal.     Nose: Congestion present.     Mouth/Throat:     Mouth: Mucous membranes are moist.     Comments: There is white and clear exudate draining in the posterior oropharynx. Eyes:  Extraocular Movements: Extraocular movements intact.     Conjunctiva/sclera: Conjunctivae normal.     Pupils: Pupils are equal, round, and reactive to light.  Cardiovascular:     Rate and Rhythm: Normal rate and regular rhythm.     Heart sounds: No murmur heard. Pulmonary:     Effort: Pulmonary effort is normal. No respiratory distress.     Breath sounds: No stridor. No wheezing, rhonchi or rales.   Musculoskeletal:     Cervical back: Neck supple.  Lymphadenopathy:     Cervical: No cervical adenopathy.  Skin:    Capillary Refill: Capillary refill takes less than 2 seconds.     Coloration: Skin is not jaundiced or pale.  Neurological:     General: No focal deficit present.     Mental Status: She is alert and oriented to person, place, and time.  Psychiatric:        Behavior: Behavior normal.      UC Treatments / Results  Labs (all labs ordered are listed, but only abnormal results are displayed) Labs Reviewed - No data to display  EKG   Radiology No results found.  Procedures Procedures (including critical care time)  Medications Ordered in UC Medications - No data to display  Initial Impression / Assessment and Plan / UC Course  I have reviewed the triage vital signs and the nursing notes.  Pertinent labs & imaging results that were available during my care of the patient were reviewed by me and considered in my medical decision making (see chart for details).     Omnicef  is sent and to treat acute sinusitis, with her worsening and length of symptoms.   Final Clinical Impressions(s) / UC Diagnoses   Final diagnoses:  Acute sinusitis, recurrence not specified, unspecified location     Discharge Instructions      Take cefdinir  300 mg--2 capsules together daily for 7 days  Continue to use the Flonase  You can also rinse both sides of your nose with saline and that will help some     ED Prescriptions     Medication Sig Dispense Auth. Provider   cefdinir  (OMNICEF ) 300 MG capsule Take 2 capsules (600 mg total) by mouth daily for 7 days. 14 capsule Vonna, Dyke Weible K, MD      PDMP not reviewed this encounter.   Vonna Sharlet POUR, MD 11/20/23 [phone]

## 2024-01-04 ENCOUNTER — Other Ambulatory Visit (HOSPITAL_COMMUNITY): Payer: Self-pay

## 2024-01-04 ENCOUNTER — Telehealth: Payer: Self-pay

## 2024-01-04 NOTE — Telephone Encounter (Signed)
 SABRA

## 2024-01-04 NOTE — Telephone Encounter (Signed)
 Medical PA submitted via [employer]. Authorization Number : [id number]  Pharmacy benefit: $645.79

## 2024-01-04 NOTE — Telephone Encounter (Signed)
 Prolia  VOB initiated via [email or web address]  Next Prolia  inj DUE: 02/04/24

## 2024-01-05 NOTE — Telephone Encounter (Signed)
 Pt ready for scheduling for PROLIA  on or after : 02/04/24  Option# 1: Buy/Bill (Office supplied medication)  Out-of-pocket cost due at time of clinic visit: $357  Number of injection/visits approved: 2  Primary: [employer] Prolia  co-insurance: 20% Admin fee co-insurance: 20%  Secondary: --- Prolia  co-insurance:  Admin fee co-insurance:   Medical Benefit Details: Date Benefits were checked: 01/04/24 Deductible: NO/ Coinsurance: 20%/ Admin Fee: 20%  Prior Auth: APPROVED PA# [id number]  Expiration Date: 01/04/24-01/03/25  # of doses approved: 2 ----------------------------------------------------------------------- Option# 2- Med Obtained from pharmacy:  Pharmacy benefit: Copay $645.79 (Paid to pharmacy) Admin Fee: 20% (Pay at clinic)  Prior Auth: N/A PA# Expiration Date:   # of doses approved:   If patient wants fill through the pharmacy benefit please send prescription to: [facility], and include estimated need by date in rx notes. Pharmacy will ship medication directly to the office.  Patient NOT eligible for Prolia  Copay Card. Copay Card can make patient's cost as little as $25. Link to apply: [email or web address]://[email or web address]  ** This summary of benefits is an estimation of the patient's out-of-pocket cost. Exact cost may very based on individual plan coverage.

## 2024-01-24 ENCOUNTER — Ambulatory Visit: Payer: Self-pay | Admitting: Internal Medicine

## 2024-01-24 ENCOUNTER — Ambulatory Visit: Admitting: Internal Medicine

## 2024-01-24 ENCOUNTER — Encounter: Payer: Self-pay | Admitting: Internal Medicine

## 2024-01-24 ENCOUNTER — Other Ambulatory Visit (HOSPITAL_COMMUNITY): Payer: Self-pay

## 2024-01-24 VITALS — BP 134/76 | HR 72 | Temp 98.1°F | Resp 16 | Ht 67.0 in | Wt 127.8 lb

## 2024-01-24 DIAGNOSIS — M810 Age-related osteoporosis without current pathological fracture: Secondary | ICD-10-CM

## 2024-01-24 DIAGNOSIS — E7800 Pure hypercholesterolemia, unspecified: Secondary | ICD-10-CM | POA: Diagnosis not present

## 2024-01-24 DIAGNOSIS — Z23 Encounter for immunization: Secondary | ICD-10-CM

## 2024-01-24 DIAGNOSIS — I10 Essential (primary) hypertension: Secondary | ICD-10-CM | POA: Diagnosis not present

## 2024-01-24 LAB — LIPID PANEL
Cholesterol: 186 mg/dL (ref 0–200)
HDL: 83.5 mg/dL (ref >39.00)
LDL Cholesterol: 91 mg/dL (ref 0–99)
NonHDL: 102.9
Total CHOL/HDL Ratio: 2
Triglycerides: 59 mg/dL (ref 0.0–149.0)
VLDL: 11.8 mg/dL (ref 0.0–40.0)

## 2024-01-24 LAB — BASIC METABOLIC PANEL WITH GFR
BUN: 15 mg/dL (ref 6–23)
CO2: 28 meq/L (ref 19–32)
Calcium: 9.6 mg/dL (ref 8.4–10.5)
Chloride: 103 meq/L (ref 96–112)
Creatinine, Ser: 0.82 mg/dL (ref 0.40–1.20)
GFR: 72.3 mL/min (ref >60.00)
Glucose, Bld: 105 mg/dL — ABNORMAL HIGH (ref 70–99)
Potassium: 3.7 meq/L (ref 3.5–5.1)
Sodium: 139 meq/L (ref 135–145)

## 2024-01-24 LAB — CBC WITH DIFFERENTIAL/PLATELET
Basophils Absolute: 0.1 K/uL (ref 0.0–0.1)
Basophils Relative: 1.2 % (ref 0.0–3.0)
Eosinophils Absolute: 0.1 K/uL (ref 0.0–0.7)
Eosinophils Relative: 1.2 % (ref 0.0–5.0)
HCT: 38.2 % (ref 36.0–46.0)
Hemoglobin: 12.8 g/dL (ref 12.0–15.0)
Lymphocytes Relative: 44.3 % (ref 12.0–46.0)
Lymphs Abs: 3.2 K/uL (ref 0.7–4.0)
MCHC: 33.5 g/dL (ref 30.0–36.0)
MCV: 91.6 fl (ref 78.0–100.0)
Monocytes Absolute: 0.7 K/uL (ref 0.1–1.0)
Monocytes Relative: 10 % (ref 3.0–12.0)
Neutro Abs: 3.2 K/uL (ref 1.4–7.7)
Neutrophils Relative %: 43.3 % (ref 43.0–77.0)
Platelets: 279 K/uL (ref 150.0–400.0)
RBC: 4.17 Mil/uL (ref 3.87–5.11)
RDW: 14.3 % (ref 11.5–15.5)
WBC: 7.3 K/uL (ref 4.0–10.5)

## 2024-01-24 LAB — PHOSPHORUS: Phosphorus: 2.6 mg/dL (ref 2.3–4.6)

## 2024-01-24 LAB — VITAMIN D 25 HYDROXY (VIT D DEFICIENCY, FRACTURES): VITD: 71.21 ng/mL (ref 30.00–100.00)

## 2024-01-24 MED ORDER — DENOSUMAB 60 MG/ML ~~LOC~~ SOSY: 60.0000 mg | PREFILLED_SYRINGE | Freq: Once | SUBCUTANEOUS | Status: DC

## 2024-01-24 MED ORDER — BOOSTRIX 5-2.5-18.5 LF-MCG/0.5 IM SUSP
0.5000 mL | Freq: Once | INTRAMUSCULAR | 0 refills | Status: AC
  Filled 2024-01-24: qty 0.5, 1d supply, fill #0

## 2024-01-24 NOTE — Progress Notes (Unsigned)
 Subjective:  Patient ID: Diane Day, female    DOB: Oct 08, 1950  Age: 73 y.o. MRN: [id number]  CC: Hypertension   HPI Diane Day presents for f/up --  Discussed the use of AI scribe software for clinical note transcription with the patient, who gave verbal consent to proceed.  History of Present Illness Diane Day is a 73 year old female who presents for a routine follow-up visit.  She is preparing for a two-mile swim in [facility] in two weeks and feels her endurance is remarkable, although she experiences some fatigue around 3,000 yards. No chest pain, shortness of breath, dizziness, or lightheadedness during physical activity.  She mentions experiencing some jaw and gum pain, which she associates with her Prolia  medication, known for potential side effects like jaw necrosis. She describes the pain as similar to having a cavity and plans to visit her dentist in the fall for further evaluation.  She is currently taking indapamide  every other day to manage her blood pressure, as daily use caused dizziness.  She has received her flu shot and shingles vaccine in 2024. She is uncertain about her tetanus vaccination status, which was last administered 11 years ago.     Outpatient Medications Prior to Visit  Medication Sig Dispense Refill   calcium carbonate (TUMS - DOSED IN MG ELEMENTAL CALCIUM) 500 MG chewable tablet Chew 1 tablet by mouth as needed.     Cholecalciferol (VITAMIN D3) 2000 UNITS TABS Take by mouth every morning.     indapamide  (LOZOL ) 1.25 MG tablet Take 1 tablet (1.25 mg total) by mouth daily. 90 tablet 0   Multiple Vitamin (MULTIVITAMIN) tablet Take 1 tablet by mouth daily.     psyllium (METAMUCIL) 58.6 % powder Take 1 packet by mouth as needed.     telmisartan  (MICARDIS ) 40 MG tablet Take 1 tablet (40 mg total) by mouth daily. 90 tablet 1   Facility-Administered Medications Prior to Visit  Medication Dose Route Frequency  Provider Last Rate Last Admin   [START ON 02/04/2024] denosumab  (PROLIA ) injection 60 mg  60 mg Subcutaneous Once Joshua Debby CROME, MD        ROS Review of Systems  Constitutional:  Negative for appetite change, chills, diaphoresis, fatigue and fever.  HENT: Negative.    Eyes: Negative.   Respiratory:  Negative for cough, chest tightness, shortness of breath and wheezing.   Cardiovascular:  Negative for chest pain, palpitations and leg swelling.  Gastrointestinal:  Negative for abdominal pain, constipation, diarrhea, nausea and vomiting.  Endocrine: Negative.   Genitourinary: Negative.  Negative for difficulty urinating and dysuria.  Musculoskeletal: Negative.  Negative for arthralgias, back pain, myalgias and neck pain.  Skin: Negative.   Neurological:  Negative for dizziness, weakness and light-headedness.  Hematological:  Negative for adenopathy. Does not bruise/bleed easily.  Psychiatric/Behavioral: Negative.      Objective:  BP 134/76 (BP Location: Left Arm, Patient Position: Sitting, Cuff Size: Normal)   Pulse 72   Temp 98.1 F (36.7 C) (Oral)   Resp 16   Ht 5' 7 (1.702 m)   Wt 127 lb 12.8 oz (58 kg)   LMP 05/16/2006   SpO2 98%   BMI 20.02 kg/m   BP Readings from Last 3 Encounters:  01/24/24 134/76  11/20/23 124/78  07/26/23 126/72    Wt Readings from Last 3 Encounters:  01/24/24 127 lb 12.8 oz (58 kg)  07/26/23 128 lb (58.1 kg)  07/24/23 128 lb 12.8 oz (58.4 kg)  Physical Exam Vitals reviewed.  Constitutional:      Appearance: Normal appearance.  HENT:     Nose: Nose normal.     Mouth/Throat:     Mouth: Mucous membranes are moist.  Eyes:     General: No scleral icterus.    Conjunctiva/sclera: Conjunctivae normal.  Cardiovascular:     Rate and Rhythm: Normal rate and regular rhythm.     Heart sounds: No murmur heard.    No friction rub. No gallop.  Pulmonary:     Effort: Pulmonary effort is normal.     Breath sounds: No stridor. No wheezing,  rhonchi or rales.  Abdominal:     General: Abdomen is flat.     Palpations: There is no mass.     Tenderness: There is no abdominal tenderness. There is no guarding.     Hernia: No hernia is present.  Musculoskeletal:        General: Normal range of motion.     Cervical back: Neck supple.     Right lower leg: No edema.     Left lower leg: No edema.  Lymphadenopathy:     Cervical: No cervical adenopathy.  Skin:    General: Skin is warm and dry.  Neurological:     General: No focal deficit present.     Mental Status: She is alert.  Psychiatric:        Mood and Affect: Mood normal.        Behavior: Behavior normal.     Lab Results  Component Value Date   WBC 7.3 01/24/2024   HGB 12.8 01/24/2024   HCT 38.2 01/24/2024   PLT 279.0 01/24/2024   GLUCOSE 105 (H) 01/24/2024   CHOL 186 01/24/2024   TRIG 59.0 01/24/2024   HDL 83.50 01/24/2024   LDLCALC 91 01/24/2024   ALT 15 07/24/2023   AST 18 07/24/2023   NA 139 01/24/2024   K 3.7 01/24/2024   CL 103 01/24/2024   CREATININE 0.82 01/24/2024   BUN 15 01/24/2024   CO2 28 01/24/2024   TSH 0.79 07/24/2023   INR 1.1 (H) 04/23/2019   HGBA1C 5.6 11/20/2014    No results found.  Assessment & Plan:   Essential hypertension- BP is well controlled. Lytes and renal function are normal. -     Basic metabolic panel with GFR; Future -     CBC with Differential/Platelet; Future  Pure hypercholesterolemia- Statin is not indicated. -     Lipid panel; Future  Age-related osteoporosis without current pathological fracture -     Basic metabolic panel with GFR; Future -     Phosphorus; Future -     VITAMIN D  25 Hydroxy (Vit-D Deficiency, Fractures); Future -     Denosumab   Need for immunization against influenza -     Flu vaccine HIGH DOSE PF(Fluzone Trivalent)  Need for prophylactic vaccination with combined diphtheria-tetanus-pertussis (DTP) vaccine -     Boostrix ; Inject 0.5 mLs into the muscle once for 1 dose.  Dispense: 0.5  mL; Refill: 0     Follow-up: Return in about 6 months (around 07/23/2024).  Debby Molt, MD

## 2024-01-24 NOTE — Patient Instructions (Signed)
 Hypertension, Adult High blood pressure (hypertension) is when the force of blood pumping through the arteries is too strong. The arteries are the blood vessels that carry blood from the heart throughout the body. Hypertension forces the heart to work harder to pump blood and may cause arteries to become narrow or stiff. Untreated or uncontrolled hypertension can lead to a heart attack, heart failure, a stroke, kidney disease, and other problems. A blood pressure reading consists of a higher number over a lower number. Ideally, your blood pressure should be below 120/80. The first ("top") number is called the systolic pressure. It is a measure of the pressure in your arteries as your heart beats. The second ("bottom") number is called the diastolic pressure. It is a measure of the pressure in your arteries as the heart relaxes. What are the causes? The exact cause of this condition is not known. There are some conditions that result in high blood pressure. What increases the risk? Certain factors may make you more likely to develop high blood pressure. Some of these risk factors are under your control, including: Smoking. Not getting enough exercise or physical activity. Being overweight. Having too much fat, sugar, calories, or salt (sodium) in your diet. Drinking too much alcohol. Other risk factors include: Having a personal history of heart disease, diabetes, high cholesterol, or kidney disease. Stress. Having a family history of high blood pressure and high cholesterol. Having obstructive sleep apnea. Age. The risk increases with age. What are the signs or symptoms? High blood pressure may not cause symptoms. Very high blood pressure (hypertensive crisis) may cause: Headache. Fast or irregular heartbeats (palpitations). Shortness of breath. Nosebleed. Nausea and vomiting. Vision changes. Severe chest pain, dizziness, and seizures. How is this diagnosed? This condition is diagnosed by  measuring your blood pressure while you are seated, with your arm resting on a flat surface, your legs uncrossed, and your feet flat on the floor. The cuff of the blood pressure monitor will be placed directly against the skin of your upper arm at the level of your heart. Blood pressure should be measured at least twice using the same arm. Certain conditions can cause a difference in blood pressure between your right and left arms. If you have a high blood pressure reading during one visit or you have normal blood pressure with other risk factors, you may be asked to: Return on a different day to have your blood pressure checked again. Monitor your blood pressure at home for 1 week or longer. If you are diagnosed with hypertension, you may have other blood or imaging tests to help your health care provider understand your overall risk for other conditions. How is this treated? This condition is treated by making healthy lifestyle changes, such as eating healthy foods, exercising more, and reducing your alcohol intake. You may be referred for counseling on a healthy diet and physical activity. Your health care provider may prescribe medicine if lifestyle changes are not enough to get your blood pressure under control and if: Your systolic blood pressure is above 130. Your diastolic blood pressure is above 80. Your personal target blood pressure may vary depending on your medical conditions, your age, and other factors. Follow these instructions at home: Eating and drinking  Eat a diet that is high in fiber and potassium, and low in sodium, added sugar, and fat. An example of this eating plan is called the DASH diet. DASH stands for Dietary Approaches to Stop Hypertension. To eat this way: Eat  plenty of fresh fruits and vegetables. Try to fill one half of your plate at each meal with fruits and vegetables. Eat whole grains, such as whole-wheat pasta, brown rice, or whole-grain bread. Fill about one  fourth of your plate with whole grains. Eat or drink low-fat dairy products, such as skim milk or low-fat yogurt. Avoid fatty cuts of meat, processed or cured meats, and poultry with skin. Fill about one fourth of your plate with lean proteins, such as fish, chicken without skin, beans, eggs, or tofu. Avoid pre-made and processed foods. These tend to be higher in sodium, added sugar, and fat. Reduce your daily sodium intake. Many people with hypertension should eat less than 1,500 mg of sodium a day. Do not drink alcohol if: Your health care provider tells you not to drink. You are pregnant, may be pregnant, or are planning to become pregnant. If you drink alcohol: Limit how much you have to: 0-1 drink a day for women. 0-2 drinks a day for men. Know how much alcohol is in your drink. In the [place] one drink equals one 12 oz bottle of beer (355 mL), one 5 oz glass of wine (148 mL), or one 1 oz glass of hard liquor (44 mL). Lifestyle  Work with your health care provider to maintain a healthy body weight or to lose weight. Ask what an ideal weight is for you. Get at least 30 minutes of exercise that causes your heart to beat faster (aerobic exercise) most days of the week. Activities may include walking, swimming, or biking. Include exercise to strengthen your muscles (resistance exercise), such as Pilates or lifting weights, as part of your weekly exercise routine. Try to do these types of exercises for 30 minutes at least 3 days a week. Do not use any products that contain nicotine or tobacco. These products include cigarettes, chewing tobacco, and vaping devices, such as e-cigarettes. If you need help quitting, ask your health care provider. Monitor your blood pressure at home as told by your health care provider. Keep all follow-up visits. This is important. Medicines Take over-the-counter and prescription medicines only as told by your health care provider. Follow directions carefully. Blood  pressure medicines must be taken as prescribed. Do not skip doses of blood pressure medicine. Doing this puts you at risk for problems and can make the medicine less effective. Ask your health care provider about side effects or reactions to medicines that you should watch for. Contact a health care provider if you: Think you are having a reaction to a medicine you are taking. Have headaches that keep coming back (recurring). Feel dizzy. Have swelling in your ankles. Have trouble with your vision. Get help right away if you: Develop a severe headache or confusion. Have unusual weakness or numbness. Feel faint. Have severe pain in your chest or abdomen. Vomit repeatedly. Have trouble breathing. These symptoms may be an emergency. Get help right away. Call 911. Do not wait to see if the symptoms will go away. Do not drive yourself to the hospital. Summary Hypertension is when the force of blood pumping through your arteries is too strong. If this condition is not controlled, it may put you at risk for serious complications. Your personal target blood pressure may vary depending on your medical conditions, your age, and other factors. For most people, a normal blood pressure is less than 120/80. Hypertension is treated with lifestyle changes, medicines, or a combination of both. Lifestyle changes include losing weight, eating a healthy,  low-sodium diet, exercising more, and limiting alcohol. This information is not intended to replace advice given to you by your health care provider. Make sure you discuss any questions you have with your health care provider. Document Revised: 03/09/2021 Document Reviewed: 03/09/2021 Elsevier Patient Education  2024 [employer].

## 2024-01-25 ENCOUNTER — Other Ambulatory Visit (HOSPITAL_COMMUNITY): Payer: Self-pay

## 2024-01-25 MED ORDER — DENOSUMAB 60 MG/ML ~~LOC~~ SOSY
60.0000 mg | PREFILLED_SYRINGE | Freq: Once | SUBCUTANEOUS | Status: AC
  Administered 2024-02-08: 60 mg via SUBCUTANEOUS

## 2024-01-29 ENCOUNTER — Ambulatory Visit: Payer: Self-pay

## 2024-01-29 NOTE — Telephone Encounter (Signed)
 1st attempt to contact patient, no answer, LVM  Copied from CRM #[id number]. Topic: Clinical - Medical Advice >> Jan 29, 2024 11:45 AM Diane Day wrote: Reason for CRM: Patient called to schedule an appointment for her Prolia  injection but stated she has some other concerning questions she would like to ask first. She is requesting a callback as soon as possible before scheduling the appointment. Her callback number is [phone].

## 2024-01-29 NOTE — Telephone Encounter (Signed)
 Patient has been scheduled for her injection and she will be using a clinic supplied prolia .

## 2024-01-29 NOTE — Telephone Encounter (Signed)
 Please see message re: order for Prolia   FYI Only or Action Required?: Action required by provider: medication refill request.  Patient was last seen in primary care on 01/24/2024 by Day Debby CROME, MD.  Called Nurse Triage reporting Medication Refill.  Triage Disposition: Discuss With PCP and Callback by Nurse Today (overriding Call PCP When Office is Open)  Patient/caregiver understands and will follow disposition?:   Reason for Disposition  [1] Caller has NON-URGENT medicine question about med that PCP prescribed AND [2] triager unable to answer question  Answer Assessment - Initial Assessment Questions Patient would like Prolia  ordered at her pharmacy. States she spoke to Diane Day about having the pharmacy administer it.   [facility] [[id number]]  1. NAME of MEDICINE: What medicine(s) are you calling about?     Prolia   2. QUESTION: What is your question? (e.g., double dose of medicine, side effect)     Requesting Prolia  order  3. PRESCRIBER: Who prescribed the medicine? Reason: if prescribed by specialist, call should be referred to that group.     Dr. Debby Day  Protocols used: Medication Question Call-A-AH

## 2024-02-05 ENCOUNTER — Ambulatory Visit

## 2024-02-08 ENCOUNTER — Ambulatory Visit: Admitting: Internal Medicine

## 2024-02-08 DIAGNOSIS — M810 Age-related osteoporosis without current pathological fracture: Secondary | ICD-10-CM | POA: Diagnosis not present

## 2024-02-08 MED ORDER — DENOSUMAB 60 MG/ML ~~LOC~~ SOSY: 60.0000 mg | PREFILLED_SYRINGE | SUBCUTANEOUS | Status: AC

## 2024-02-08 NOTE — Progress Notes (Signed)
 Pt was in office today for Prolia  injection, injection was given subcutaneous in R arm. Pt tolerated well.

## 2024-02-20 ENCOUNTER — Other Ambulatory Visit: Payer: Self-pay | Admitting: Internal Medicine

## 2024-02-20 DIAGNOSIS — I10 Essential (primary) hypertension: Secondary | ICD-10-CM

## 2024-03-18 ENCOUNTER — Encounter: Payer: Self-pay | Admitting: Radiology

## 2024-04-15 DIAGNOSIS — Z1231 Encounter for screening mammogram for malignant neoplasm of breast: Secondary | ICD-10-CM | POA: Diagnosis not present

## 2024-04-15 LAB — HM MAMMOGRAPHY

## 2024-04-16 ENCOUNTER — Encounter: Payer: Self-pay | Admitting: Internal Medicine

## 2024-04-18 ENCOUNTER — Ambulatory Visit: Payer: Self-pay | Admitting: Obstetrics and Gynecology

## 2024-05-25 ENCOUNTER — Other Ambulatory Visit: Payer: Self-pay | Admitting: Internal Medicine

## 2024-05-25 DIAGNOSIS — I10 Essential (primary) hypertension: Secondary | ICD-10-CM

## 2024-06-06 ENCOUNTER — Emergency Department (HOSPITAL_COMMUNITY)

## 2024-06-06 ENCOUNTER — Other Ambulatory Visit: Payer: Self-pay

## 2024-06-06 ENCOUNTER — Emergency Department (HOSPITAL_COMMUNITY)
Admission: EM | Admit: 2024-06-06 | Discharge: 2024-06-06 | Disposition: A | Discharge status: Home / Self Care | Attending: Emergency Medicine | Admitting: Emergency Medicine

## 2024-06-06 DIAGNOSIS — R0600 Dyspnea, unspecified: Secondary | ICD-10-CM

## 2024-06-06 DIAGNOSIS — R0602 Shortness of breath: Secondary | ICD-10-CM | POA: Diagnosis present

## 2024-06-06 DIAGNOSIS — R000 Tachycardia, unspecified: Secondary | ICD-10-CM | POA: Insufficient documentation

## 2024-06-06 LAB — BASIC METABOLIC PANEL WITH GFR
Anion gap: 8 (ref 5–15)
BUN: 12 mg/dL (ref 8–23)
CO2: 29 mmol/L (ref 22–32)
Calcium: 10 mg/dL (ref 8.9–10.3)
Chloride: 105 mmol/L (ref 98–111)
Creatinine, Ser: 0.72 mg/dL (ref 0.44–1.00)
GFR, Estimated: >60 mL/min
Glucose, Bld: 100 mg/dL — ABNORMAL HIGH (ref 70–99)
Potassium: 3.9 mmol/L (ref 3.5–5.1)
Sodium: 142 mmol/L (ref 135–145)

## 2024-06-06 LAB — CBC
HCT: 41.4 % (ref 36.0–46.0)
Hemoglobin: 13 g/dL (ref 12.0–15.0)
MCH: 29.8 pg (ref 26.0–34.0)
MCHC: 31.4 g/dL (ref 30.0–36.0)
MCV: 95 fL (ref 80.0–100.0)
Platelets: 322 K/uL (ref 150–400)
RBC: 4.36 MIL/uL (ref 3.87–5.11)
RDW: 13.8 % (ref 11.5–15.5)
WBC: 6.3 K/uL (ref 4.0–10.5)
nRBC: 0 % (ref 0.0–0.2)

## 2024-06-06 LAB — TSH: TSH: 0.716 u[IU]/mL (ref 0.350–4.500)

## 2024-06-06 LAB — TROPONIN T, HIGH SENSITIVITY
Troponin T High Sensitivity: <6 ng/L (ref 0–19)
Troponin T High Sensitivity: <6 ng/L (ref 0–19)

## 2024-06-06 MED ORDER — IOHEXOL 350 MG/ML SOLN
75.0000 mL | Freq: Once | INTRAVENOUS | Status: AC | PRN
  Administered 2024-06-06: 75 mL via INTRAVENOUS

## 2024-06-06 NOTE — ED Provider Notes (Signed)
 " [place] EMERGENCY DEPARTMENT AT [facility] Provider Note   CSN: [id number] Arrival date & time: 06/06/24  9056     Patient presents with: Shortness of Breath   Diane Day is a 74 y.o. female.   74 year old female presents with shortness of breath and tachycardia.  States that she has felt this way for about a week or so.  Denies any chest pain or chest pressure.  Notes that it occurs when she is at rest and feels that she has to take a deep breath.  Notes that it does get worse with walking.  Denies any cough or congestion.  No recent history of volume loss.  No black or bloody stools.  No prior history of same.  No new medications started.       Prior to Admission medications  Medication Sig Start Date End Date Taking? Authorizing Provider  calcium carbonate (TUMS - DOSED IN MG ELEMENTAL CALCIUM) 500 MG chewable tablet Chew 1 tablet by mouth as needed.    [provider]  Cholecalciferol (VITAMIN D3) 2000 UNITS TABS Take by mouth every morning.    [provider]  indapamide  (LOZOL ) 1.25 MG tablet Take 1 tablet (1.25 mg total) by mouth daily. 05/29/24   Joshua Debby CROME, MD  Multiple Vitamin (MULTIVITAMIN) tablet Take 1 tablet by mouth daily.    [provider]  psyllium (METAMUCIL) 58.6 % powder Take 1 packet by mouth as needed.    [provider]  telmisartan  (MICARDIS ) 40 MG tablet Take 1 tablet (40 mg total) by mouth daily. 02/21/24   Joshua Debby CROME, MD    Allergies: Patient has no known allergies.    Review of Systems  All other systems reviewed and are negative.   Updated Vital Signs BP (!) 148/87 (BP Location: Right Arm)   Pulse 80   Temp 98.3 F (36.8 C) (Oral)   Resp 16   Ht 1.702 m (5' 7)   Wt 58.1 kg   LMP 05/16/2006   SpO2 100%   BMI 20.05 kg/m   Physical Exam Vitals and nursing note reviewed.  Constitutional:      General: She is not in acute distress.    Appearance: Normal appearance. She is  well-developed. She is not toxic-appearing.  HENT:     Head: Normocephalic and atraumatic.  Eyes:     General: Lids are normal.     Conjunctiva/sclera: Conjunctivae normal.     Pupils: Pupils are equal, round, and reactive to light.  Neck:     Thyroid : No thyroid  mass.     Trachea: No tracheal deviation.  Cardiovascular:     Rate and Rhythm: Normal rate and regular rhythm.     Heart sounds: Normal heart sounds. No murmur heard.    No gallop.  Pulmonary:     Effort: Pulmonary effort is normal. No respiratory distress.     Breath sounds: Normal breath sounds. No stridor. No decreased breath sounds, wheezing, rhonchi or rales.  Abdominal:     General: There is no distension.     Palpations: Abdomen is soft.     Tenderness: There is no abdominal tenderness. There is no rebound.  Musculoskeletal:        General: No tenderness. Normal range of motion.     Cervical back: Normal range of motion and neck supple.  Skin:    General: Skin is warm and dry.     Findings: No abrasion or rash.  Neurological:  Mental Status: She is alert and oriented to person, place, and time. Mental status is at baseline.     GCS: GCS eye subscore is 4. GCS verbal subscore is 5. GCS motor subscore is 6.     Cranial Nerves: No cranial nerve deficit.     Sensory: No sensory deficit.     Motor: Motor function is intact.  Psychiatric:        Attention and Perception: Attention normal.        Speech: Speech normal.        Behavior: Behavior normal.     (all labs ordered are listed, but only abnormal results are displayed) Labs Reviewed  BASIC METABOLIC PANEL WITH GFR - Abnormal; Notable for the following components:      Result Value   Glucose, Bld 100 (*)    All other components within normal limits  CBC  TSH  T4  D-DIMER, QUANTITATIVE    EKG: EKG Interpretation Date/Time:  Thursday June 06 2024 09:54:59 EST Ventricular Rate:  67 PR Interval:  153 QRS Duration:  74 QT Interval:  393 QTC  Calculation: 415 R Axis:   78  Text Interpretation: Sinus rhythm Probable anteroseptal infarct, old Confirmed by Dasie Faden ([id number]) on 06/06/2024 11:18:42 AM  Radiology: DG Chest 2 View Result Date: 06/06/2024 CLINICAL DATA:  Shortness of breath EXAM: CHEST - 2 VIEW COMPARISON:  May 05, 2007 FINDINGS: The heart size and mediastinal contours are within normal limits. Both lungs are clear. The visualized skeletal structures are unremarkable. IMPRESSION: No active cardiopulmonary disease. Electronically Signed   By: Lynwood Landy Raddle M.D.   On: 06/06/2024 10:32     Procedures   Medications Ordered in the ED - No data to display                                  Medical Decision Making Amount and/or Complexity of Data Reviewed Labs: ordered. Radiology: ordered.  Risk Prescription drug management.   Patient is EKG shows sinus rhythm.  Chest x-ray without acute findings.  High suspicion for PE and she had a CTA scan of her chest which was negative.  TSH is normal.  Troponin negative as well.  No evidence of ACS at this time.  Will discharge and she can follow-up with her doctor     Final diagnoses:  None    ED Discharge Orders     None          Dasie Faden, MD 06/06/24 1340  "

## 2024-06-06 NOTE — ED Triage Notes (Signed)
 Pt came in for shortness of breath and tachycardia when doing non-strenuous activities (walking or knitting). Pt stated she does not feel short of breath, but feels like she needs to take a deep breath sometimes.

## 2024-06-06 NOTE — ED Notes (Signed)
 Patient Alert and oriented to baseline. Stable and ambulatory to baseline. Patient verbalized understanding of the discharge instructions.  Patient belongings were taken by the patient.

## 2024-06-06 NOTE — Discharge Instructions (Signed)
 Call your doctor to schedule a follow-up visit for a heart monitor device

## 2024-06-07 LAB — T4: T4, Total: 8.3 ug/dL (ref 4.5–12.0)

## 2024-06-10 ENCOUNTER — Encounter: Payer: Self-pay | Admitting: Internal Medicine

## 2024-06-11 ENCOUNTER — Encounter: Payer: Self-pay | Admitting: Internal Medicine

## 2024-06-11 ENCOUNTER — Ambulatory Visit

## 2024-06-11 ENCOUNTER — Ambulatory Visit: Admitting: Internal Medicine

## 2024-06-11 VITALS — BP 112/74 | HR 81 | Temp 98.1°F | Resp 16 | Ht 67.0 in | Wt 129.8 lb

## 2024-06-11 DIAGNOSIS — I479 Paroxysmal tachycardia, unspecified: Secondary | ICD-10-CM

## 2024-06-11 DIAGNOSIS — Z23 Encounter for immunization: Secondary | ICD-10-CM

## 2024-06-11 DIAGNOSIS — I10 Essential (primary) hypertension: Secondary | ICD-10-CM

## 2024-06-11 MED ORDER — BOOSTRIX 5-2.5-18.5 LF-MCG/0.5 IM SUSP: 0.5000 mL | Freq: Once | INTRAMUSCULAR | 0 refills | Status: AC

## 2024-06-11 NOTE — Progress Notes (Signed)
 "  Subjective:  Patient ID: Diane Day, female    DOB: 1951/01/03  Age: 74 y.o. MRN: [id number]  CC: Hypertension and Hospitalization Follow-up (Elevated heart rate )   HPI Diane Day presents for f/up ---  Discussed the use of AI scribe software for clinical note transcription with the patient, who gave verbal consent to proceed.  History of Present Illness Man Diane Day is a 74 year old female who presents with concerns about elevated heart rate and decreased exertion threshold.  She has experienced a noticeable change in her exertion threshold, requiring deep breaths even at rest, for several weeks. Her heart rate has been elevated since around Christmas, reaching up to 120 beats per minute when rising from a chair in the evening. Despite this, she does not experience dizziness, lightheadedness, or irregular heartbeats. She is concerned about her ability to continue swimming and walking at her usual level of activity.  A recent emergency room visit included an EKG, CT scan, and chest x-ray, all reported as normal. She has been monitoring her heart rate more closely after receiving a notification from her Apple watch indicating an increase in her walking heart rate.  No pain when taking a deep breath, coughing, or stomach issues. Her sleep is reported as okay, and she does not take any medication for mood nor feels the need for it.     Outpatient Medications Prior to Visit  Medication Sig Dispense Refill   calcium carbonate (TUMS - DOSED IN MG ELEMENTAL CALCIUM) 500 MG chewable tablet Chew 1 tablet by mouth as needed.     Cholecalciferol (VITAMIN D3) 2000 UNITS TABS Take by mouth every morning.     Multiple Vitamin (MULTIVITAMIN) tablet Take 1 tablet by mouth daily.     telmisartan  (MICARDIS ) 40 MG tablet Take 1 tablet (40 mg total) by mouth daily. 90 tablet 1   indapamide  (LOZOL ) 1.25 MG tablet Take 1 tablet (1.25 mg total) by mouth daily. 90 tablet 0    psyllium (METAMUCIL) 58.6 % powder Take 1 packet by mouth as needed.     Facility-Administered Medications Prior to Visit  Medication Dose Route Frequency Provider Last Rate Last Admin   [START ON 08/06/2024] denosumab  (PROLIA ) injection 60 mg  60 mg Subcutaneous Q6 months Diane Debby CROME, MD        ROS Review of Systems  Constitutional:  Negative for appetite change, chills, diaphoresis, fatigue and fever.  HENT: Negative.  Negative for trouble swallowing.   Respiratory: Negative.  Negative for cough, chest tightness, shortness of breath and wheezing.   Cardiovascular:  Positive for palpitations. Negative for chest pain and leg swelling.  Gastrointestinal: Negative.  Negative for abdominal pain, constipation, diarrhea, nausea and rectal pain.  Genitourinary: Negative.  Negative for difficulty urinating.  Musculoskeletal: Negative.  Negative for arthralgias and myalgias.  Skin: Negative.   Neurological:  Negative for dizziness, weakness and light-headedness.  Hematological:  Negative for adenopathy. Does not bruise/bleed easily.  Psychiatric/Behavioral: Negative.      Objective:  BP 112/74 (BP Location: Left Arm, Patient Position: Sitting, Cuff Size: Small)   Pulse 81   Temp 98.1 F (36.7 C) (Oral)   Resp 16   Ht 5' 7 (1.702 m)   Wt 129 lb 12.8 oz (58.9 kg)   LMP 05/16/2006   SpO2 98%   BMI 20.33 kg/m   BP Readings from Last 3 Encounters:  06/11/24 112/74  06/06/24 (!) 143/89  01/24/24 134/76    Wt Readings  from Last 3 Encounters:  06/11/24 129 lb 12.8 oz (58.9 kg)  06/06/24 128 lb (58.1 kg)  01/24/24 127 lb 12.8 oz (58 kg)    Physical Exam Vitals reviewed.  Constitutional:      Appearance: Normal appearance.  HENT:     Mouth/Throat:     Mouth: Mucous membranes are moist.  Eyes:     General: No scleral icterus.    Conjunctiva/sclera: Conjunctivae normal.  Cardiovascular:     Rate and Rhythm: Normal rate and regular rhythm.     Heart sounds: No murmur  heard.    No friction rub. No gallop.  Pulmonary:     Effort: Pulmonary effort is normal.     Breath sounds: No stridor. No wheezing, rhonchi or rales.  Abdominal:     General: Abdomen is flat.     Palpations: There is no mass.     Tenderness: There is no abdominal tenderness. There is no guarding.     Hernia: No hernia is present.  Musculoskeletal:        General: Normal range of motion.     Cervical back: Neck supple.     Right lower leg: No edema.     Left lower leg: No edema.  Lymphadenopathy:     Cervical: No cervical adenopathy.  Skin:    General: Skin is warm and dry.  Neurological:     General: No focal deficit present.     Mental Status: She is alert.  Psychiatric:        Mood and Affect: Mood normal.        Behavior: Behavior normal.     Lab Results  Component Value Date   WBC 6.3 06/06/2024   HGB 13.0 06/06/2024   HCT 41.4 06/06/2024   PLT 322 06/06/2024   GLUCOSE 100 (H) 06/06/2024   CHOL 186 01/24/2024   TRIG 59.0 01/24/2024   HDL 83.50 01/24/2024   LDLCALC 91 01/24/2024   ALT 15 07/24/2023   AST 18 07/24/2023   NA 142 06/06/2024   K 3.9 06/06/2024   CL 105 06/06/2024   CREATININE 0.72 06/06/2024   BUN 12 06/06/2024   CO2 29 06/06/2024   TSH 0.716 06/06/2024   INR 1.1 (H) 04/23/2019   HGBA1C 5.6 11/20/2014    CT Angio Chest PE W/Cm &/Or Wo Cm Result Date: 06/06/2024 CLINICAL DATA:  Shortness of breath, tachycardia EXAM: CT ANGIOGRAPHY CHEST WITH CONTRAST TECHNIQUE: Multidetector CT imaging of the chest was performed using the standard protocol during bolus administration of intravenous contrast. Multiplanar CT image reconstructions and MIPs were obtained to evaluate the vascular anatomy. RADIATION DOSE REDUCTION: This exam was performed according to the departmental dose-optimization program which includes automated exposure control, adjustment of the mA and/or kV according to patient size and/or use of iterative reconstruction technique. CONTRAST:   75mL OMNIPAQUE  IOHEXOL  350 MG/ML SOLN COMPARISON:  None Available. FINDINGS: Cardiovascular: Satisfactory opacification of the pulmonary arteries to the segmental level. No evidence of pulmonary embolism. Normal heart size. No pericardial effusion. Mediastinum/Nodes: No enlarged mediastinal, hilar, or axillary lymph nodes. Thyroid  gland, trachea, and esophagus demonstrate no significant findings. Lungs/Pleura: Lungs are clear. No pleural effusion or pneumothorax. Upper Abdomen: No acute abnormality. Musculoskeletal: No chest wall abnormality. No acute or significant osseous findings. Review of the MIP images confirms the above findings. IMPRESSION: No definite evidence of pulmonary embolus. No acute abnormality seen in the chest. Electronically Signed   By: Lynwood Landy Raddle M.D.   On: 06/06/2024 12:43  DG Chest 2 View Result Date: 06/06/2024 CLINICAL DATA:  Shortness of breath EXAM: CHEST - 2 VIEW COMPARISON:  May 05, 2007 FINDINGS: The heart size and mediastinal contours are within normal limits. Both lungs are clear. The visualized skeletal structures are unremarkable. IMPRESSION: No active cardiopulmonary disease. Electronically Signed   By: Lynwood Landy Raddle M.D.   On: 06/06/2024 10:32    Assessment & Plan:  Need for prophylactic vaccination with combined diphtheria-tetanus-pertussis (DTP) vaccine -     Boostrix ; Inject 0.5 mLs into the muscle once for 1 dose.  Dispense: 0.5 mL; Refill: 0  Tachycardia, paroxysmal (HCC)- Will monitor with a Zio patch. -     LONG TERM MONITOR (3-14 DAYS); Future -     Ambulatory referral to Cardiology  Essential hypertension - BP is over-controlled. Will discontinue the thiazide diuretic but continue the ARB.     Follow-up: Return in about 3 months (around 09/09/2024).  Debby Molt, MD "

## 2024-06-11 NOTE — Patient Instructions (Signed)
 PLEASE STOP TAKING INDAPAMIDE     Sinus Tachycardia  Sinus tachycardia is a fast heartbeat. In sinus tachycardia, the heart beats more than 100 times a minute. Sinus tachycardia starts in the part of the heart called the sinoatrial (SA) node. Sinus tachycardia may be harmless, or it may be a sign of a serious condition. What are the causes? This condition may be caused by: Exercise or exertion. A fever. Pain. Loss of body fluids (dehydration). Severe bleeding (hemorrhage). Anxiety and stress. Certain substances, including: Alcohol. Caffeine. Tobacco and nicotine products. Cold medicines. Illegal drugs. Medical conditions including: Heart disease. An infection. An overactive thyroid  (hyperthyroidism). A lack of red blood cells (anemia). What are the signs or symptoms? Symptoms of this condition include: A feeling that the heart is beating fast or unevenly (palpitations). Suddenly noticing your heartbeat (cardiac awareness). Lightheadedness. Tiredness (fatigue). Shortness of breath. Chest pain. Nausea. Fainting. How is this diagnosed? This condition is diagnosed with: A physical exam. Tests or monitoring, such as: Blood tests. An electrocardiogram (ECG). This test measures the electrical activity of the heart. Ambulatory cardiac monitor. This records your heartbeats for 24 hours or more. You may be referred to a heart specialist (cardiologist). How is this treated? Treatment for this condition depends on the cause. Treatment may involve: Treating the underlying condition. Taking new medicines or changing your current medicines as told by your health care provider. Making changes to your diet or lifestyle. Follow these instructions at home: Lifestyle  Do not use any products that contain nicotine or tobacco. These products include cigarettes, chewing tobacco, and vaping devices, such as e-cigarettes. If you need help quitting, ask your health care provider. Do not use  illegal drugs, such as cocaine. Learn relaxation methods to help you when you get stressed or anxious. These include deep breathing. Avoid caffeine or other stimulants, including herbal stimulants that are found in energy drinks. Alcohol use  Do not drink alcohol if: Your health care provider tells you not to drink. You are pregnant, may be pregnant, or are planning to become pregnant. If you drink alcohol: Limit how much you have to: 0-1 drink a day for women. 0-2 drinks a day for men. Know how much alcohol is in your drink. In the [place] one drink equals one 12 oz bottle of beer (355 mL), one 5 oz glass of wine (148 mL), or one 1 oz glass of hard liquor (44 mL). General instructions Drink enough fluids to keep your urine pale yellow. Take over-the-counter and prescription medicines only as told by your health care provider. Ask your health care provider about taking vitamins, herbs, and supplements. Contact a health care provider if: You have vomiting or diarrhea that does not go away. You have a fever. You have weakness or dizziness. You feel faint. Get help right away if: You have pain in your chest, upper arms, jaw, or neck. You have palpitations that do not go away. Summary In sinus tachycardia, the heart beats more than 100 times a minute. Sinus tachycardia may be harmless, or it may be a sign of a serious condition. Treatment for this condition depends on the cause or the underlying condition. Get help right away if you have pain in your chest, upper arms, jaw, or neck. This information is not intended to replace advice given to you by your health care provider. Make sure you discuss any questions you have with your health care provider. Document Revised: 08/31/2021 Document Reviewed: 08/31/2021 Elsevier Patient Education  2024 [employer].

## 2024-06-11 NOTE — Progress Notes (Unsigned)
 EP to read.

## 2024-06-11 NOTE — Telephone Encounter (Signed)
Patient has since been seen in the office

## 2024-06-14 ENCOUNTER — Encounter: Payer: Self-pay | Admitting: Internal Medicine

## 2024-06-14 ENCOUNTER — Ambulatory Visit: Attending: Internal Medicine | Admitting: Internal Medicine

## 2024-06-14 VITALS — BP 114/68 | HR 84 | Ht 67.0 in | Wt 130.0 lb

## 2024-06-14 DIAGNOSIS — R002 Palpitations: Secondary | ICD-10-CM | POA: Diagnosis not present

## 2024-06-14 DIAGNOSIS — R0602 Shortness of breath: Secondary | ICD-10-CM | POA: Diagnosis not present

## 2024-06-14 DIAGNOSIS — I479 Paroxysmal tachycardia, unspecified: Secondary | ICD-10-CM

## 2024-06-14 MED ORDER — METOPROLOL TARTRATE 50 MG PO TABS: 50.0000 mg | ORAL_TABLET | Freq: Once | ORAL | 0 refills | Status: AC

## 2024-06-14 NOTE — Progress Notes (Signed)
 " Cardiology Office Note:  .   Date:  06/14/2024  ID:  Diane Day, DOB 1951-02-20, MRN [id number] PCP: Joshua Debby CROME, MD  [facility] Providers Cardiologist:  None    History of Present Illness: .     Discussed the use of AI scribe software for clinical note transcription with the patient, who gave verbal consent to proceed.  History of Present Illness Diane Day is a 74 year old female with hypertension who presents with concern for tachycardia. She was referred by her PCP due to concern for tachycardia.  Tachycardia and exertional intolerance - Heart rate increases with minimal activity, including sitting, knitting, and brushing teeth - Decreased threshold for exertion over the past few weeks, not present a few months ago - Continues to swim but at a slower pace due to concern about elevated heart rate - No chest pain, syncope, or presyncope  Peripheral edema - No recent swelling in feet or legs - Experienced ankle swelling over Christmas when unable to swim - Increased indapamide  to daily use during that period, which was stopped earlier this week  Hypertension management - Current antihypertensive regimen includes telmisartan  40 mg daily and Micardis  - Previously took indapamide  every other day, increased to daily during period of edema, discontinued earlier this week  Relevant negative history - No history of thyroid  disease, myocardial infarction, heart failure, atrial fibrillation, or sleep apnea          ROS: Remaining review of systems negative  Studies Reviewed: SABRA   EKG Interpretation Date/Time:  Friday June 14 2024 08:44:19 EST Ventricular Rate:  88 PR Interval:  166 QRS Duration:  70 QT Interval:  358 QTC Calculation: 433 R Axis:   70  Text Interpretation: Sinus rhythm with marked sinus arrhythmia Septal infarct , age undetermined Abnormal ECG When compared with ECG of 06-Jun-2024 09:54, Sinus arrhythmia NOW PRESENT  Confirmed by Kriste Hicks [id number]) on 06/14/2024 9:44:01 AM    Results Labs TSH (06/06/2024): Within normal limits  Radiology CTA chest: No pulmonary embolism or acute abnormalities  Diagnostic EKG (06/14/2024): Sinus arrhythmia; otherwise normal; nonspecific electrical abnormalities possibly suggestive of arterial disease, may be normal variant in women (Independently interpreted) Risk Assessment/Calculations:             Physical Exam:   VS:  BP 114/68   Pulse 84   Ht 5' 7 (1.702 m)   Wt 130 lb (59 kg)   LMP 05/16/2006   SpO2 99%   BMI 20.36 kg/m    Wt Readings from Last 3 Encounters:  06/14/24 130 lb (59 kg)  06/11/24 129 lb 12.8 oz (58.9 kg)  06/06/24 128 lb (58.1 kg)    GEN: Well nourished, well developed in no acute distress NECK: No JVD; No carotid bruits CARDIAC:  RRR, no murmurs, no rubs, no gallops RESPIRATORY:  Clear to auscultation without rales, wheezing or rhonchi  ABDOMEN: Soft, non-tender, non-distended EXTREMITIES:  No edema; No deformity   ASSESSMENT AND PLAN: .    Assessment and Plan Assessment & Plan Paroxysmal tachycardia/palpitations/dyspnea on exertion Reports increased heart rate with minimal exertion and occasional elevated heart rate at rest. EKG shows sinus arrhythmia with septal infarct pattern. Differential includes potential coronary artery disease due to family history.  Patient exercises regularly but has been decreasing activity level due to concern for symptoms and notices symptoms while at rest and minimal ADLs.  Has a family history of CAD - Ordered echocardiogram to assess cardiac function and  valve status. - Ordered CCTA with Lopressor  prior - Await results of the Zio patch monitoring.  Hypertension Managed with telmisartan . - Continue telmisartan  as prescribed.            Follow up: 3 months  Signed, Emeline FORBES Calender, DO  06/14/2024 9:44 AM    [facility] "

## 2024-06-14 NOTE — Patient Instructions (Addendum)
 Medication Instructions:   TAKE ONCE :  METOPROLOL  50 MG  2 HOURS PRIOR TO SCHEDULED  PROCEDURE   *If you need a refill on your cardiac medications before your next appointment, please call your pharmacy*   Lab Work: NONE ORDERED  TODAY   If you have labs (blood work) drawn today and your tests are completely normal, you will receive your results only by: MyChart Message (if you have MyChart) OR A paper copy in the mail If you have any lab test that is abnormal or we need to change your treatment, we will call you to review the results.   Testing/Procedures: Your physician has requested that you have an echocardiogram. Echocardiography is a painless test that uses sound waves to create images of your heart. It provides your doctor with information about the size and shape of your heart and how well your hearts chambers and valves are working. This procedure takes approximately one hour. There are no restrictions for this procedure. Please do NOT wear cologne, perfume, aftershave, or lotions (deodorant is allowed). Please arrive 15 minutes prior to your appointment time.  Please note: We ask at that you not bring children with you during ultrasound (echo/ vascular) testing. Due to room size and safety concerns, children are not allowed in the ultrasound rooms during exams. Our front office staff cannot provide observation of children in our lobby area while testing is being conducted. An adult accompanying a patient to their appointment will only be allowed in the ultrasound room at the discretion of the ultrasound technician under special circumstances. We apologize for any inconvenience.     Non-Cardiac CT Angiography (CTA), is a special type of CT scan that uses a computer to produce multi-dimensional views of major blood vessels throughout the body. In CT angiography, a contrast material is injected through an IV to help visualize the blood vessels     Follow-Up: At [facility], you and your health needs are our priority.  As part of our continuing mission to provide you with exceptional heart care, our providers are all part of one team.  This team includes your primary Cardiologist (physician) and Advanced Practice Providers or APPs (Physician Assistants and Nurse Practitioners) who all work together to provide you with the care you need, when you need it.  Your next appointment:   BASED UPON RESULTS   We recommend signing up for the patient portal called MyChart.  Sign up information is provided on this After Visit Summary.  MyChart is used to connect with patients for Virtual Visits (Telemedicine).  Patients are able to view lab/test results, encounter notes, upcoming appointments, etc.  Non-urgent messages can be sent to your provider as well.   To learn more about what you can do with MyChart, go to [email or web address].   Other Instructions    Your cardiac CT will be scheduled at one of the below locations:   [facility] [address] [place], [place] [address] [phone] (Severe contrast allergies only)  OR   [facility] [address] [place], [place] [address] [phone]  OR   [facility] [address] [place], [place] [address] [phone]  OR   [facility] and Vascular Tower [address]  [place], [place] [address]  OR   [facility] [address] [place], [place] [phone]  If scheduled at [facility], please arrive at the [facility] and Children's Entrance (Entrance C2) of  [facility] 30 minutes prior to test start time. You can use the FREE valet parking offered at entrance C (encouraged to control the heart rate for the test)  Proceed to the [facility] Radiology Department (first floor) to check-in and test prep.  All radiology patients and guests should use entrance C2 at [facility], accessed from [facility], even though the hospital's physical address listed is [address].  If scheduled at the Heart and Vascular Tower at [address], please enter the parking lot using the [address] entrance and use the FREE valet service at the patient drop-off area. Enter the building and check-in with registration on the main floor.  If scheduled at [facility], please arrive to the Heart and Vascular Center 15 mins early for check-in and test prep.  There is spacious parking and easy access to the radiology department from the [facility] Heart and Vascular entrance. Please enter here and check-in with the desk attendant.   If scheduled at [facility], please arrive 30 minutes early for check-in and test prep.  Please follow these instructions carefully (unless otherwise directed):  An IV will be required for this test and Nitroglycerin  will be given.    On the Night Before the Test: Be sure to Drink plenty of water. Do not consume any caffeinated/decaffeinated beverages or chocolate 12 hours prior to your test. Do not take any antihistamines 12 hours prior to your test.  On the Day of the Test: Drink plenty of water until 1 hour prior to the test. Do not eat any food 1 hour prior to test. You may take your regular medications prior to the test.  Take metoprolol  (Lopressor ) two hours prior to test. If you take Furosemide/Hydrochlorothiazide/Spironolactone/Chlorthalidone, please HOLD on the morning of the test. Patients who wear a continuous glucose monitor MUST remove the device prior to scanning. FEMALES- please wear underwire-free bra if available, avoid dresses & tight clothing After the Test: Drink plenty of water. After receiving IV contrast, you may experience a mild flushed feeling. This is normal. On occasion, you may experience a mild rash up to 24 hours after the test. This is not dangerous. If this occurs, you can take Benadryl  25 mg, Zyrtec, Claritin, or Allegra and increase your fluid intake. (Patients taking Tikosyn should avoid Benadryl, and may take Zyrtec, Claritin, or Allegra) If you experience trouble breathing, this can be serious. If it is severe call 911 IMMEDIATELY. If it is mild, please call our office.  We will call to schedule your test 2-4 weeks out understanding that some insurance companies will need an authorization prior to the service being performed.   For more information and frequently asked questions, please visit our website : [email or web address]  For non-scheduling related questions, please contact the cardiac imaging nurse navigator should you have any questions/concerns: Cardiac Imaging Nurse Navigators Direct Office Dial: [phone]   For scheduling needs, including cancellations and rescheduling, please call Brittany, [phone].  For billing questions, please call [phone].

## 2024-06-28 ENCOUNTER — Ambulatory Visit (HOSPITAL_COMMUNITY)

## 2024-07-23 ENCOUNTER — Ambulatory Visit: Admitting: Internal Medicine

## 2024-07-23 ENCOUNTER — Ambulatory Visit (HOSPITAL_COMMUNITY)

## 2024-09-09 ENCOUNTER — Ambulatory Visit: Admitting: Internal Medicine
